# Patient Record
Sex: Female | Born: 1995 | Race: Black or African American | Hispanic: No | Marital: Single | Smoking: Never smoker
Health system: Southern US, Community
[De-identification: ages and names within clinical notes are randomized; demographics above are authoritative.]

## PROBLEM LIST (undated history)

## (undated) ENCOUNTER — Ambulatory Visit (HOSPITAL_COMMUNITY): Disposition: A | Discharge status: Other Acute Inpatient Hospital | Payer: Medicaid Other

## (undated) ENCOUNTER — Inpatient Hospital Stay (HOSPITAL_COMMUNITY): Payer: Self-pay

## (undated) DIAGNOSIS — O24419 Gestational diabetes mellitus in pregnancy, unspecified control: Secondary | ICD-10-CM

## (undated) DIAGNOSIS — E119 Type 2 diabetes mellitus without complications: Secondary | ICD-10-CM

## (undated) DIAGNOSIS — K219 Gastro-esophageal reflux disease without esophagitis: Secondary | ICD-10-CM

## (undated) DIAGNOSIS — N390 Urinary tract infection, site not specified: Secondary | ICD-10-CM

## (undated) HISTORY — PX: NO PAST SURGERIES: SHX2092

---
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
## 2014-06-15 ENCOUNTER — Encounter (HOSPITAL_COMMUNITY): Payer: Self-pay | Admitting: Emergency Medicine

## 2014-06-15 ENCOUNTER — Emergency Department (HOSPITAL_COMMUNITY)
Admission: EM | Admit: 2014-06-15 | Discharge: 2014-06-15 | Disposition: A | Discharge status: Home / Self Care | Payer: Medicaid Other | Attending: Emergency Medicine | Admitting: Emergency Medicine

## 2014-06-15 DIAGNOSIS — M79673 Pain in unspecified foot: Secondary | ICD-10-CM

## 2014-06-15 DIAGNOSIS — M79672 Pain in left foot: Secondary | ICD-10-CM | POA: Diagnosis not present

## 2014-06-15 DIAGNOSIS — M79671 Pain in right foot: Secondary | ICD-10-CM | POA: Insufficient documentation

## 2014-06-15 MED ORDER — NAPROXEN 500 MG PO TABS: 500.0000 mg | ORAL_TABLET | Freq: Two times a day (BID) | ORAL | Status: DC

## 2014-06-15 NOTE — ED Notes (Signed)
Pt reports that "when I wake up  In the morning my feet be stuck" and that pt has to soak her feet in hot water. 10/10. Reports this has been going on 1.5 months.

## 2014-06-15 NOTE — ED Provider Notes (Signed)
CSN: [id number]     Arrival date & time 06/15/14  [id number] History  This chart was scribed for a non-physician practitioner, Joycie PeekBenjamin Harrell Niehoff, PA-C working with Gilda Creasehristopher J. Pollina, MD by [place] Peace, ED Scribe. The patient was seen in [facility]. The patient's care was started at 7:55 PM.    Chief Complaint  Patient presents with  . Foot Pain      Patient is a 18 y.o. female presenting with lower extremity pain. The history is provided by the patient. No language interpreter was used.  Foot Pain   HPI Comments: Annette Juarez is a 18 y.o. female who presents to the Emergency Department complaining of bilateral foot pain onset 1.5 months. Pt states that when she wakes up in the morning, "her feet can't move, as if they are "frozen". She reports that pain is specifically to top part of both of her feet. She adds that she has to soak her feet in hot water to "unfreeze" them in the mornings which provide her with brief relief. Pt denies history of gout or any type of arthritis. She states that she just took medication for the first time yesterday in order to address the problem with no improvement. Pt is non-smoker.    History reviewed. No pertinent past medical history. History reviewed. No pertinent past surgical history. History reviewed. No pertinent family history. History  Substance Use Topics  . Smoking status: Never Smoker   . Smokeless tobacco: Not on file  . Alcohol Use: No   OB History   Grav Para Term Preterm Abortions TAB SAB Ect Mult Living                 Review of Systems  Constitutional: Negative for fever.  Musculoskeletal:       Bilateral foot pain.   All other systems reviewed and are negative.     Allergies  Review of patient's allergies indicates no known allergies.  Home Medications   Prior to Admission medications   Medication Sig Start Date End Date Taking? Authorizing Provider  naproxen (NAPROSYN) 500 MG tablet Take 1 tablet (500 mg total) by  mouth 2 (two) times daily. 06/15/14   Earle GellBenjamin W Damarie Schoolfield, PA-C   BP 154/86  Pulse 98  Temp(Src) 97.8 F (36.6 C) (Oral)  Resp 16  SpO2 100%  LMP 05/11/2014 Physical Exam  Nursing note and vitals reviewed. Constitutional: She is oriented to person, place, and time. She appears well-developed and well-nourished. No distress.  Awake, alert, nontoxic appearance.  HENT:  Head: Normocephalic and atraumatic.  Eyes: Conjunctivae and EOM are normal. Right eye exhibits no discharge. Left eye exhibits no discharge.  Neck: Neck supple. No tracheal deviation present.  Cardiovascular: Normal rate.   Pulmonary/Chest: Effort normal. No respiratory distress. She exhibits no tenderness.  Abdominal: Soft. There is no tenderness. There is no rebound.  Musculoskeletal: Normal range of motion. She exhibits no tenderness.  Baseline ROM, no obvious new focal weakness. No active tenderness to either foot. Cap refill <2 secs bil. Full active ROM of both feet. No erythema or edema. No obvious rashes, lesions or other deformities appreciated.  Neurological: She is alert and oriented to person, place, and time.  Mental status and motor strength appears baseline for patient and situation.  Skin: Skin is warm and dry. No rash noted.  Psychiatric: She has a normal mood and affect. Her behavior is normal.    ED Course  Procedures (including critical care time) Labs Review Labs Reviewed -  No data to display  Results for orders placed during the hospital encounter of 09/30/13  URINALYSIS, ROUTINE W REFLEX MICROSCOPIC      Result Value Ref Range   Color, Urine YELLOW  YELLOW   APPearance HAZY (*) CLEAR   Specific Gravity, Urine 1.029  1.005 - 1.030   pH 6.5  5.0 - 8.0   Glucose, UA NEGATIVE  NEGATIVE mg/dL   Hgb urine dipstick NEGATIVE  NEGATIVE   Bilirubin Urine NEGATIVE  NEGATIVE   Ketones, ur NEGATIVE  NEGATIVE mg/dL   Protein, ur NEGATIVE  NEGATIVE mg/dL   Urobilinogen, UA 1.0  0.0 - 1.0 mg/dL    Nitrite NEGATIVE  NEGATIVE   Leukocytes, UA NEGATIVE  NEGATIVE  POCT PREGNANCY, URINE      Result Value Ref Range   Preg Test, Ur NEGATIVE  NEGATIVE   No results found.    Imaging Review No results found.   EKG Interpretation None     Medications - No data to display  8:01 PM- Treatment plan was discussed with patient who verbalizes understanding and agrees.   MDM  Vitals stable - WNL -afebrile Pt resting comfortably in ED. PE not concerning for acute or emergent pathology. Symptoms likely due to inflammation. Pt displays no neuro deficits, distal pulses intact. Low suspicion for frx, dislocation or other anatomical abnormality. Pt able to ambulate independently with no ataxia. Will DC with Naproxen for pain and inflammation relief. Discussed f/u with PCP and return precautions, pt very amenable to plan.   Final diagnoses:  Foot pain, unspecified laterality      I personally performed the services described in this documentation, which was scribed in my presence. The recorded information has been reviewed and is accurate.   Earle GellBenjamin W Adamsburgartner, PA-C 06/16/14 1219

## 2014-06-18 NOTE — ED Provider Notes (Signed)
Medical screening examination/treatment/procedure(s) were performed by non-physician practitioner and as supervising physician I was immediately available for consultation/collaboration.   EKG Interpretation None        Mathew Storck J. Loria Lacina, MD 06/18/14 1447 

## 2014-08-09 ENCOUNTER — Emergency Department (HOSPITAL_COMMUNITY)
Admission: EM | Admit: 2014-08-09 | Discharge: 2014-08-09 | Disposition: A | Discharge status: Home / Self Care | Payer: Medicaid Other | Attending: Emergency Medicine | Admitting: Emergency Medicine

## 2014-08-09 ENCOUNTER — Encounter (HOSPITAL_COMMUNITY): Payer: Self-pay | Admitting: Emergency Medicine

## 2014-08-09 DIAGNOSIS — R197 Diarrhea, unspecified: Secondary | ICD-10-CM

## 2014-08-09 DIAGNOSIS — Z3202 Encounter for pregnancy test, result negative: Secondary | ICD-10-CM | POA: Diagnosis not present

## 2014-08-09 DIAGNOSIS — R110 Nausea: Secondary | ICD-10-CM | POA: Insufficient documentation

## 2014-08-09 DIAGNOSIS — R109 Unspecified abdominal pain: Secondary | ICD-10-CM | POA: Diagnosis present

## 2014-08-09 LAB — COMPREHENSIVE METABOLIC PANEL
ALBUMIN: 4.2 g/dL (ref 3.5–5.2)
ALK PHOS: 71 U/L (ref 39–117)
ALT: 14 U/L (ref 0–35)
ANION GAP: 14 (ref 5–15)
AST: 16 U/L (ref 0–37)
BILIRUBIN TOTAL: 0.4 mg/dL (ref 0.3–1.2)
BUN: 9 mg/dL (ref 6–23)
CHLORIDE: 101 meq/L (ref 96–112)
CO2: 22 mEq/L (ref 19–32)
Calcium: 9.2 mg/dL (ref 8.4–10.5)
Creatinine, Ser: 0.62 mg/dL (ref 0.50–1.10)
GFR calc Af Amer: >90 mL/min (ref >90)
GFR calc non Af Amer: >90 mL/min (ref >90)
Glucose, Bld: 82 mg/dL (ref 70–99)
POTASSIUM: 4.4 meq/L (ref 3.7–5.3)
Sodium: 137 mEq/L (ref 137–147)
Total Protein: 8.5 g/dL — ABNORMAL HIGH (ref 6.0–8.3)

## 2014-08-09 LAB — URINALYSIS, ROUTINE W REFLEX MICROSCOPIC
BILIRUBIN URINE: NEGATIVE
GLUCOSE, UA: NEGATIVE mg/dL
Hgb urine dipstick: NEGATIVE
Ketones, ur: NEGATIVE mg/dL
Leukocytes, UA: NEGATIVE
Nitrite: NEGATIVE
Protein, ur: NEGATIVE mg/dL
Specific Gravity, Urine: 1.023 (ref 1.005–1.030)
UROBILINOGEN UA: 1 mg/dL (ref 0.0–1.0)
pH: 8 (ref 5.0–8.0)

## 2014-08-09 LAB — PREGNANCY, URINE: Preg Test, Ur: NEGATIVE

## 2014-08-09 LAB — CBC WITH DIFFERENTIAL/PLATELET
BASOS ABS: 0 10*3/uL (ref 0.0–0.1)
Basophils Relative: 1 % (ref 0–1)
EOS ABS: 0.3 10*3/uL (ref 0.0–0.7)
Eosinophils Relative: 4 % (ref 0–5)
HEMATOCRIT: 41.3 % (ref 36.0–46.0)
Hemoglobin: 14.2 g/dL (ref 12.0–15.0)
Lymphocytes Relative: 51 % — ABNORMAL HIGH (ref 12–46)
Lymphs Abs: 4.6 10*3/uL — ABNORMAL HIGH (ref 0.7–4.0)
MCH: 30.9 pg (ref 26.0–34.0)
MCHC: 34.4 g/dL (ref 30.0–36.0)
MCV: 90 fL (ref 78.0–100.0)
Monocytes Absolute: 0.6 10*3/uL (ref 0.1–1.0)
Monocytes Relative: 7 % (ref 3–12)
Neutro Abs: 3.3 10*3/uL (ref 1.7–7.7)
Neutrophils Relative %: 37 % — ABNORMAL LOW (ref 43–77)
Platelets: 403 10*3/uL — ABNORMAL HIGH (ref 150–400)
RBC: 4.59 MIL/uL (ref 3.87–5.11)
RDW: 12.2 % (ref 11.5–15.5)
WBC: 8.8 10*3/uL (ref 4.0–10.5)

## 2014-08-09 LAB — LIPASE, BLOOD: Lipase: 32 U/L (ref 11–59)

## 2014-08-09 MED ORDER — NAPROXEN 500 MG PO TABS: 500.0000 mg | ORAL_TABLET | Freq: Two times a day (BID) | ORAL | Status: DC

## 2014-08-09 MED ORDER — PROMETHAZINE HCL 25 MG PO TABS: 25.0000 mg | ORAL_TABLET | Freq: Four times a day (QID) | ORAL | Status: DC | PRN

## 2014-08-09 MED ORDER — DICYCLOMINE HCL 20 MG PO TABS: 20.0000 mg | ORAL_TABLET | Freq: Two times a day (BID) | ORAL | Status: DC | PRN

## 2014-08-09 NOTE — Discharge Instructions (Signed)
Diarrhea °Diarrhea is watery poop (stool). It can make you feel weak, tired, thirsty, or give you a dry mouth (signs of dehydration). Watery poop is a sign of another problem, most often an infection. It often lasts 2-3 days. It can last longer if it is a sign of something serious. Take care of yourself as told by your doctor. °HOME CARE  °· Drink 1 cup (8 ounces) of fluid each time you have watery poop. °· Do not drink the following fluids: °¨ Those that contain simple sugars (fructose, glucose, galactose, lactose, sucrose, maltose). °¨ Sports drinks. °¨ Fruit juices. °¨ Whole milk products. °¨ Sodas. °¨ Drinks with caffeine (coffee, tea, soda) or alcohol. °· Oral rehydration solution may be used if the doctor says it is okay. You may make your own solution. Follow this recipe: °¨  - teaspoon table salt. °¨ ¾ teaspoon baking soda. °¨  teaspoon salt substitute containing potassium chloride. °¨ 1 tablespoons sugar. °¨ 1 liter (34 ounces) of water. °· Avoid the following foods: °¨ High fiber foods, such as raw fruits and vegetables. °¨ Nuts, seeds, and whole grain breads and cereals. °¨  Those that are sweetened with sugar alcohols (xylitol, sorbitol, mannitol). °· Try eating the following foods: °¨ Starchy foods, such as rice, toast, pasta, low-sugar cereal, oatmeal, baked potatoes, crackers, and bagels. °¨ Bananas. °¨ Applesauce. °· Eat probiotic-rich foods, such as yogurt and milk products that are fermented. °· Wash your hands well after each time you have watery poop. °· Only take medicine as told by your doctor. °· Take a warm bath to help lessen burning or pain from having watery poop. °GET HELP RIGHT AWAY IF:  °· You cannot drink fluids without throwing up (vomiting). °· You keep throwing up. °· You have blood in your poop, or your poop looks black and tarry. °· You do not pee (urinate) in 6-8 hours, or there is only a small amount of very dark pee. °· You have belly (abdominal) pain that gets worse or stays  in the same spot (localizes). °· You are weak, dizzy, confused, or light-headed. °· You have a very bad headache. °· Your watery poop gets worse or does not get better. °· You have a fever or lasting symptoms for more than 2-3 days. °· You have a fever and your symptoms suddenly get worse. °MAKE SURE YOU:  °· Understand these instructions. °· Will watch your condition. °· Will get help right away if you are not doing well or get worse. °Document Released: 02/01/2008 Document Revised: 12/30/2013 Document Reviewed: 04/22/2012 °[employer]® Patient Information ©2015 [employer]. This information is not intended to replace advice given to you by your health care provider. Make sure you discuss any questions you have with your health care provider. ° °

## 2014-08-09 NOTE — ED Notes (Signed)
Pt from home c/o low back pain and lower abdominal pain. She also reports BRB from the rectum and in her stools. Denies urinary symptoms. C/o  Nausea no vomiting, diarrhea present.

## 2014-08-09 NOTE — ED Provider Notes (Signed)
CSN: [id number]     Arrival date & time 08/09/14  1137 History   First MD Initiated Contact with Patient 08/09/14 1226     Chief Complaint  Patient presents with  . Back Pain  . Abdominal Pain   HPI Patient presents to the emergency room with complaints of back pain, diarrhea and abdominal cramping. Symptoms started a few days ago. She had some nausea but no vomiting. She's also had some intermittent diarrhea. She has noticed some blood in her stool when it first started but has not noticed any recently. She denies any fevers. She denies any vaginal discharge. She denies any dysuria. Appetite has otherwise been unaffected. Nothing seems to make the pain better or worse. She denies any ill contacts. No recent antibiotics. No foreign travel. History reviewed. No pertinent past medical history. History reviewed. No pertinent past surgical history. No family history on file. History  Substance Use Topics  . Smoking status: Never Smoker   . Smokeless tobacco: Not on file  . Alcohol Use: No   OB History    No data available     Review of Systems  All other systems reviewed and are negative.     Allergies  Review of patient's allergies indicates no known allergies.  Home Medications   Prior to Admission medications   Medication Sig Start Date End Date Taking? Authorizing Provider  dicyclomine (BENTYL) 20 MG tablet Take 1 tablet (20 mg total) by mouth 2 (two) times daily as needed for spasms. 08/09/14   Linwood DibblesJon Baelynn Schmuhl, MD  naproxen (NAPROSYN) 500 MG tablet Take 1 tablet (500 mg total) by mouth 2 (two) times daily. 08/09/14   Linwood DibblesJon Deshunda Thackston, MD  promethazine (PHENERGAN) 25 MG tablet Take 1 tablet (25 mg total) by mouth every 6 (six) hours as needed for nausea or vomiting. 08/09/14   Linwood DibblesJon Baker Moronta, MD   BP 125/77 mmHg  Pulse 71  Temp(Src) 98 F (36.7 C) (Oral)  Resp 18  SpO2 100%  LMP 07/17/2014 Physical Exam  Constitutional: She appears well-developed and well-nourished. No distress.  HENT:   Head: Normocephalic and atraumatic.  Right Ear: External ear normal.  Left Ear: External ear normal.  Eyes: Conjunctivae are normal. Right eye exhibits no discharge. Left eye exhibits no discharge. No scleral icterus.  Neck: Neck supple. No tracheal deviation present.  Cardiovascular: Normal rate, regular rhythm and intact distal pulses.   Pulmonary/Chest: Effort normal and breath sounds normal. No stridor. No respiratory distress. She has no wheezes. She has no rales.  Abdominal: Soft. Bowel sounds are normal. She exhibits no distension. There is no tenderness. There is no rebound and no guarding.  Musculoskeletal: She exhibits no edema or tenderness.  Neurological: She is alert. She has normal strength. No cranial nerve deficit (no facial droop, extraocular movements intact, no slurred speech) or sensory deficit. She exhibits normal muscle tone. She displays no seizure activity. Coordination normal.  Skin: Skin is warm and dry. No rash noted.  Psychiatric: She has a normal mood and affect.  Nursing note and vitals reviewed.   ED Course  Procedures (including critical care time) Labs Review Labs Reviewed  COMPREHENSIVE METABOLIC PANEL - Abnormal; Notable for the following:    Total Protein 8.5 (*)    All other components within normal limits  CBC WITH DIFFERENTIAL - Abnormal; Notable for the following:    Platelets 403 (*)    Neutrophils Relative % 37 (*)    Lymphocytes Relative 51 (*)    Lymphs Abs  4.6 (*)    All other components within normal limits  URINALYSIS, ROUTINE W REFLEX MICROSCOPIC - Abnormal; Notable for the following:    APPearance CLOUDY (*)    All other components within normal limits  LIPASE, BLOOD  PREGNANCY, URINE  POC URINE PREG, ED    Imaging Review No results found.   MDM   Final diagnoses:  Diarrhea    Doubt appendicitis or colitis.  No focal abdominal ttp.  Nl labs.  Doubt significant gi bleeding.  Labs normal.  No recent blood in her stool.   ?viral illness    At this time there does not appear to be any evidence of an acute emergency medical condition and the patient appears stable for discharge with appropriate outpatient follow up.    Linwood DibblesJon Mateja Dier, MD 08/09/14 [phone]

## 2014-12-27 ENCOUNTER — Emergency Department (HOSPITAL_COMMUNITY)
Admission: EM | Admit: 2014-12-27 | Discharge: 2014-12-27 | Disposition: A | Discharge status: Home / Self Care | Payer: Medicaid Other | Attending: Emergency Medicine | Admitting: Emergency Medicine

## 2014-12-27 ENCOUNTER — Encounter (HOSPITAL_COMMUNITY): Payer: Self-pay | Admitting: Emergency Medicine

## 2014-12-27 DIAGNOSIS — Z3202 Encounter for pregnancy test, result negative: Secondary | ICD-10-CM | POA: Diagnosis not present

## 2014-12-27 DIAGNOSIS — R1032 Left lower quadrant pain: Secondary | ICD-10-CM | POA: Insufficient documentation

## 2014-12-27 DIAGNOSIS — Z79899 Other long term (current) drug therapy: Secondary | ICD-10-CM | POA: Diagnosis not present

## 2014-12-27 DIAGNOSIS — N939 Abnormal uterine and vaginal bleeding, unspecified: Secondary | ICD-10-CM | POA: Diagnosis not present

## 2014-12-27 DIAGNOSIS — R1031 Right lower quadrant pain: Secondary | ICD-10-CM | POA: Diagnosis not present

## 2014-12-27 DIAGNOSIS — R1030 Lower abdominal pain, unspecified: Secondary | ICD-10-CM | POA: Diagnosis present

## 2014-12-27 LAB — URINALYSIS, ROUTINE W REFLEX MICROSCOPIC
BILIRUBIN URINE: NEGATIVE
GLUCOSE, UA: NEGATIVE mg/dL
KETONES UR: NEGATIVE mg/dL
LEUKOCYTES UA: NEGATIVE
Nitrite: NEGATIVE
PROTEIN: NEGATIVE mg/dL
Specific Gravity, Urine: 1.029 (ref 1.005–1.030)
Urobilinogen, UA: 1 mg/dL (ref 0.0–1.0)
pH: 5.5 (ref 5.0–8.0)

## 2014-12-27 LAB — URINE MICROSCOPIC-ADD ON

## 2014-12-27 LAB — WET PREP, GENITAL
TRICH WET PREP: NONE SEEN
WBC WET PREP: NONE SEEN
Yeast Wet Prep HPF POC: NONE SEEN

## 2014-12-27 LAB — POC URINE PREG, ED: PREG TEST UR: NEGATIVE

## 2014-12-27 MED ORDER — ONDANSETRON HCL 4 MG PO TABS: 4.0000 mg | ORAL_TABLET | Freq: Four times a day (QID) | ORAL | Status: DC

## 2014-12-27 MED ORDER — ONDANSETRON HCL 4 MG/2ML IJ SOLN
4.0000 mg | Freq: Once | INTRAMUSCULAR | Status: DC
  Filled 2014-12-27: qty 2

## 2014-12-27 MED ORDER — ONDANSETRON 8 MG PO TBDP
8.0000 mg | ORAL_TABLET | Freq: Once | ORAL | Status: AC
  Administered 2014-12-27: 8 mg via ORAL
  Filled 2014-12-27: qty 1

## 2014-12-27 NOTE — ED Notes (Signed)
Attempted to start IV, pt refused before stick

## 2014-12-27 NOTE — ED Notes (Signed)
Pt alert, oriented, and ambulatory upon. DC. She was advised to follow [facility] with [facility]. 

## 2014-12-27 NOTE — ED Provider Notes (Signed)
CSN: [id number]     Arrival date & time 12/27/14  1621 History   First MD Initiated Contact with Patient 12/27/14 1637     Chief Complaint  Patient presents with  . Abdominal Pain     (Consider location/radiation/quality/duration/timing/severity/associated sxs/prior Treatment) HPI Comments: Patient presents today with lower abdominal pain.  She reports that the pain is located across her lower abdomen and has been present intermittently over the past couple of months.  She describes the pain as a cramping pain, but reports that occasionally the pain becomes sharp.  She has been taking Ibuprofen for the pain, which helps.  She denies pain at this time.  She reports associated nausea and has had two episodes of vomiting earlier today.  She also reports associated diarrhea over the past 2 days.  She also reports whitish colored vaginal discharge over the past 2 weeks.  She denies fever, chills, urinary symptoms, or constipation.  LMP ws 12/22/14, which lasted for 5 days.  She states that prior to that she did not have a menstrual period for 5 months.  Patient is a 19 y.o. female presenting with abdominal pain. The history is provided by the patient.  Abdominal Pain   History reviewed. No pertinent past medical history. History reviewed. No pertinent past surgical history. No family history on file. History  Substance Use Topics  . Smoking status: Never Smoker   . Smokeless tobacco: Not on file  . Alcohol Use: No   OB History    No data available     Review of Systems  Gastrointestinal: Positive for abdominal pain.  All other systems reviewed and are negative.     Allergies  Review of patient's allergies indicates no known allergies.  Home Medications   Prior to Admission medications   Medication Sig Start Date End Date Taking? Authorizing Provider  ibuprofen (ADVIL,MOTRIN) 200 MG tablet Take 800 mg by mouth every 6 (six) hours as needed for moderate pain.   Yes Historical  Provider, MD  dicyclomine (BENTYL) 20 MG tablet Take 1 tablet (20 mg total) by mouth 2 (two) times daily as needed for spasms. Patient not taking: Reported on 12/27/2014 08/09/14   Linwood Dibbles, MD  naproxen (NAPROSYN) 500 MG tablet Take 1 tablet (500 mg total) by mouth 2 (two) times daily. Patient not taking: Reported on 12/27/2014 08/09/14   Linwood Dibbles, MD  promethazine (PHENERGAN) 25 MG tablet Take 1 tablet (25 mg total) by mouth every 6 (six) hours as needed for nausea or vomiting. Patient not taking: Reported on 12/27/2014 08/09/14   Linwood Dibbles, MD   BP 132/96 mmHg  Pulse 95  Temp(Src) 98 F (36.7 C) (Oral)  Resp 18  SpO2 100%  LMP 12/27/2014 Physical Exam  Constitutional: She appears well-developed and well-nourished.  HENT:  Head: Normocephalic and atraumatic.  Mouth/Throat: Oropharynx is clear and moist.  Neck: Normal range of motion. Neck supple.  Cardiovascular: Normal rate, regular rhythm and normal heart sounds.   Pulmonary/Chest: Effort normal and breath sounds normal.  Abdominal: Soft. Bowel sounds are normal. She exhibits no distension and no mass. There is no tenderness. There is no rebound and no guarding.  Obese abdomen  Genitourinary: Cervix exhibits no motion tenderness. Right adnexum displays no mass, no tenderness and no fullness. Left adnexum displays no mass, no tenderness and no fullness.  Small amount of blood in the vaginal vault.  Musculoskeletal: Normal range of motion.  Neurological: She is alert.  Skin: Skin is warm and dry.  Psychiatric: She has a normal mood and affect.  Nursing note and vitals reviewed.   ED Course  Procedures (including critical care time) Labs Review Labs Reviewed  WET PREP, GENITAL  CBC WITH DIFFERENTIAL/PLATELET  COMPREHENSIVE METABOLIC PANEL  URINALYSIS, ROUTINE W REFLEX MICROSCOPIC  HIV ANTIBODY (ROUTINE TESTING)  POC URINE PREG, ED  GC/CHLAMYDIA PROBE AMP ([facility])    Imaging Review No results found.   EKG  Interpretation None      MDM   Final diagnoses:  None   Patient presents today with intermittent bilateral lower abdominal pain that has been present intermittently over the past couple of months.  Urine pregnancy negative.  UA is also negative.  Patient refused labs.  Abdomen non tender to palpation at this time.  No CMT or adnexal tenderness on exam.  Therefore, do not feel that imaging is indicated at this time.  She also reports irregular menses.  [facility]  Patient stable for discharge.  Return precautions given.      Santiago GladHeather Juwon Scripter, PA-C 12/28/14 1059  Linwood DibblesJon Knapp, MD 12/28/14 [phone]

## 2014-12-27 NOTE — ED Notes (Signed)
Per pt, states she has been on her period for 5 days-has'nt had one in 5 months-has not had a gyn check up or seen PCP

## 2014-12-27 NOTE — ED Notes (Signed)
Pt states she is to scared to have blood drawn. She says she can not do it.

## 2014-12-29 LAB — GC/CHLAMYDIA PROBE AMP (~~LOC~~) NOT AT ARMC
Chlamydia: NEGATIVE
Neisseria Gonorrhea: NEGATIVE

## 2015-07-07 ENCOUNTER — Emergency Department (HOSPITAL_COMMUNITY): Payer: Medicaid Other

## 2015-07-07 ENCOUNTER — Emergency Department (HOSPITAL_COMMUNITY)
Admission: EM | Admit: 2015-07-07 | Discharge: 2015-07-07 | Disposition: A | Discharge status: Home / Self Care | Payer: Medicaid Other | Attending: Emergency Medicine | Admitting: Emergency Medicine

## 2015-07-07 ENCOUNTER — Encounter (HOSPITAL_COMMUNITY): Payer: Self-pay | Admitting: Emergency Medicine

## 2015-07-07 DIAGNOSIS — Y9389 Activity, other specified: Secondary | ICD-10-CM | POA: Insufficient documentation

## 2015-07-07 DIAGNOSIS — W01198A Fall on same level from slipping, tripping and stumbling with subsequent striking against other object, initial encounter: Secondary | ICD-10-CM | POA: Insufficient documentation

## 2015-07-07 DIAGNOSIS — S20221A Contusion of right back wall of thorax, initial encounter: Secondary | ICD-10-CM

## 2015-07-07 DIAGNOSIS — S300XXA Contusion of lower back and pelvis, initial encounter: Secondary | ICD-10-CM | POA: Diagnosis not present

## 2015-07-07 DIAGNOSIS — Y998 Other external cause status: Secondary | ICD-10-CM | POA: Insufficient documentation

## 2015-07-07 DIAGNOSIS — L03115 Cellulitis of right lower limb: Secondary | ICD-10-CM | POA: Diagnosis not present

## 2015-07-07 DIAGNOSIS — Z3202 Encounter for pregnancy test, result negative: Secondary | ICD-10-CM | POA: Diagnosis not present

## 2015-07-07 DIAGNOSIS — S3992XA Unspecified injury of lower back, initial encounter: Secondary | ICD-10-CM | POA: Diagnosis present

## 2015-07-07 DIAGNOSIS — Y9289 Other specified places as the place of occurrence of the external cause: Secondary | ICD-10-CM | POA: Diagnosis not present

## 2015-07-07 LAB — POC URINE PREG, ED: Preg Test, Ur: NEGATIVE

## 2015-07-07 MED ORDER — IBUPROFEN 800 MG PO TABS
800.0000 mg | ORAL_TABLET | Freq: Once | ORAL | Status: AC
  Administered 2015-07-07: 800 mg via ORAL
  Filled 2015-07-07: qty 1

## 2015-07-07 MED ORDER — KETOROLAC TROMETHAMINE 60 MG/2ML IM SOLN
30.0000 mg | Freq: Once | INTRAMUSCULAR | Status: DC
  Filled 2015-07-07: qty 2

## 2015-07-07 MED ORDER — METHOCARBAMOL 500 MG PO TABS: 500.0000 mg | ORAL_TABLET | Freq: Two times a day (BID) | ORAL | Status: DC

## 2015-07-07 MED ORDER — CEPHALEXIN 500 MG PO CAPS: 500.0000 mg | ORAL_CAPSULE | Freq: Three times a day (TID) | ORAL | Status: DC

## 2015-07-07 MED ORDER — IBUPROFEN 600 MG PO TABS: 600.0000 mg | ORAL_TABLET | Freq: Four times a day (QID) | ORAL | Status: DC | PRN

## 2015-07-07 NOTE — ED Notes (Signed)
Pt hit lower back on bed railing now c/o lower back pain.

## 2015-07-07 NOTE — ED Notes (Signed)
Patient was alert, oriented and stable upon discharge. RN went over AVS and patient had no further questions.  

## 2015-07-07 NOTE — ED Provider Notes (Signed)
CSN: [id number]     Arrival date & time 07/07/15  1518 History  By signing my name below, I, Murriel Hopper, attest that this documentation has been prepared under the direction and in the presence of [employer], PA-C.  Electronically Signed: Murriel Hopper, ED Scribe. 07/07/2015. 3:50 PM.       Chief Complaint  Patient presents with  . Back Pain     Patient is a 19 y.o. female presenting with back pain. The history is provided by the patient. No language interpreter was used.  Back Pain Location:  Lumbar spine Radiates to:  Does not radiate Pain severity:  Moderate Onset quality:  Gradual Duration:  3 days Timing:  Constant Progression:  Worsening Chronicity:  New Context: falling   Associated symptoms: no numbness and no weakness   Risk factors: obesity    HPI Comments: Annette Juarez is a 19 y.o. female who presents to the Emergency Department complaining of constant, worsening bilateral lower back pain with an associated laceration on the back of her left ankle that has been present for three days. Pt states that she fell three days ago while helping someone move their bed, and hit her lower back on the bed railing, and scraped the back of her left ankle on another part of the bed at the same time. Pt reports at the time the incident occurred she had no pain in her back, but over the course of the next two days, pain worsened. Pt states that she cleaned the laceration with water and peroxide and has put Neosporin on the area since then, but the wound has worsened in appearance and increased in tenderness to palpation. Pt states she took two Ibuprofen this morning with little relief.    History reviewed. No pertinent past medical history. History reviewed. No pertinent past surgical history. History reviewed. No pertinent family history. Social History  Substance Use Topics  . Smoking status: Never Smoker   . Smokeless tobacco: None  . Alcohol Use: No   OB History    No data available     Review of Systems  Musculoskeletal: Positive for myalgias, back pain and arthralgias.  Skin: Positive for wound.  Neurological: Negative for weakness and numbness.  All other systems reviewed and are negative.     Allergies  Review of patient's allergies indicates no known allergies.  Home Medications   Prior to Admission medications   Medication Sig Start Date End Date Taking? Authorizing Provider  ibuprofen (ADVIL,MOTRIN) 200 MG tablet Take 800 mg by mouth every 6 (six) hours as needed for moderate pain.   Yes Historical Provider, MD   BP 155/89 mmHg  Pulse 88  Temp(Src) 98 F (36.7 C) (Oral)  Resp 18  SpO2 100%  LMP 06/22/2015 (Approximate) Physical Exam  Constitutional: She is oriented to person, place, and time. She appears well-developed and well-nourished.  HENT:  Head: Normocephalic and atraumatic.  Eyes: Conjunctivae are normal.  Cardiovascular: Normal rate.   Pulmonary/Chest: Effort normal.  Abdominal: She exhibits no distension.  Musculoskeletal:  Midline lumbar spine tenderness and right perispinal tenderness. No pain with bilateral straight leg raise  Neurological: She is alert and oriented to person, place, and time.  5/5 and equal lower extremity strength. 2+ and equal patellar reflexes bilaterally. Pt able to dorsiflex bilateral toes and feet with good strength against resistance. Equal sensation bilaterally over thighs and lower legs.   Skin: Skin is warm and dry.  Psychiatric: She has a normal mood and  affect.  Nursing note and vitals reviewed.   ED Course  Procedures (including critical care time) DIAGNOSTIC STUDIES: Oxygen Saturation is 100% on room air, normal by my interpretation.    COORDINATION OF CARE: 3:48 PM Discussed treatment plan with pt at bedside and pt agreed to plan.   Labs Review Labs Reviewed - No data to display  Imaging Review No results found. I have personally reviewed and evaluated these images  and lab results as part of my medical decision-making.   EKG Interpretation None      MDM   Final diagnoses:  Contusion, back, right, initial encounter  Cellulitis of right ankle    patient with lower back pain after hitting it on a bed rail 3 days ago. No deformity or step-offs noted on exam. No neurological signs. Pain does not radiate. Doubt cauda equina. Pain is localized to the area where she hit it on the bed frame. X-ray obtained to rule out any spinous process fractures and it is negative. Home with ibuprofen, Robaxin, follow with primary care doctor.  Filed Vitals:   07/07/15 1523  BP: 155/89  Pulse: 88  Temp: 98 F (36.7 C)  TempSrc: Oral  Resp: 18  SpO2: 100%    I personally performed the services described in this documentation, which was scribed in my presence. The recorded information has been reviewed and is accurate.   Jaynie Crumbleatyana Mikiah Demond, PA-C 07/07/15 1705  Lorre NickAnthony Allen, MD 07/09/15 1328

## 2015-07-07 NOTE — Discharge Instructions (Signed)
Ibuprofen for pain. Robaxin for spasms. Try ice/heat. Rest. Follow up with primary care doctor if not improving.   Back Pain, Adult Back pain is very common in adults.The cause of back pain is rarely dangerous and the pain often gets better over time.The cause of your back pain may not be known. Some common causes of back pain include:  Strain of the muscles or ligaments supporting the spine.  Wear and tear (degeneration) of the spinal disks.  Arthritis.  Direct injury to the back. For many people, back pain may return. Since back pain is rarely dangerous, most people can learn to manage this condition on their own. HOME CARE INSTRUCTIONS Watch your back pain for any changes. The following actions may help to lessen any discomfort you are feeling:  Remain active. It is stressful on your back to sit or stand in one place for long periods of time. Do not sit, drive, or stand in one place for more than 30 minutes at a time. Take short walks on even surfaces as soon as you are able.Try to increase the length of time you walk each day.  Exercise regularly as directed by your health care provider. Exercise helps your back heal faster. It also helps avoid future injury by keeping your muscles strong and flexible.  Do not stay in bed.Resting more than 1-2 days can delay your recovery.  Pay attention to your body when you bend and lift. The most comfortable positions are those that put less stress on your recovering back. Always use proper lifting techniques, including:  Bending your knees.  Keeping the load close to your body.  Avoiding twisting.  Find a comfortable position to sleep. Use a firm mattress and lie on your side with your knees slightly bent. If you lie on your back, put a pillow under your knees.  Avoid feeling anxious or stressed.Stress increases muscle tension and can worsen back pain.It is important to recognize when you are anxious or stressed and learn ways to manage  it, such as with exercise.  Take medicines only as directed by your health care provider. Over-the-counter medicines to reduce pain and inflammation are often the most helpful.Your health care provider may prescribe muscle relaxant drugs.These medicines help dull your pain so you can more quickly return to your normal activities and healthy exercise.  Apply ice to the injured area:  Put ice in a plastic bag.  Place a towel between your skin and the bag.  Leave the ice on for 20 minutes, 2-3 times a day for the first 2-3 days. After that, ice and heat may be alternated to reduce pain and spasms.  Maintain a healthy weight. Excess weight puts extra stress on your back and makes it difficult to maintain good posture. SEEK MEDICAL CARE IF:  You have pain that is not relieved with rest or medicine.  You have increasing pain going down into the legs or buttocks.  You have pain that does not improve in one week.  You have night pain.  You lose weight.  You have a fever or chills. SEEK IMMEDIATE MEDICAL CARE IF:   You develop new bowel or bladder control problems.  You have unusual weakness or numbness in your arms or legs.  You develop nausea or vomiting.  You develop abdominal pain.  You feel faint.   This information is not intended to replace advice given to you by your health care provider. Make sure you discuss any questions you have with your  health care provider.   Document Released: 08/15/2005 Document Revised: 09/05/2014 Document Reviewed: 12/17/2013 Elsevier Interactive Patient Education 2016 [place] Injury Prevention Back injuries can be very painful. They can also be difficult to heal. After having one back injury, you are more likely to injure your back again. It is important to learn how to avoid injuring or re-injuring your back. The following tips can help you to prevent a back injury. WHAT SHOULD I KNOW ABOUT PHYSICAL FITNESS?  Exercise for 30  minutes per day on most days of the week or as told by your doctor. Make sure to:  Do aerobic exercises, such as walking, jogging, biking, or swimming.  Do exercises that increase balance and strength, such as tai chi and yoga.  Do stretching exercises. This helps with flexibility.  Try to develop strong belly (abdominal) muscles. Your belly muscles help to support your back.  Stay at a healthy weight. This helps to decrease your risk of a back injury. WHAT SHOULD I KNOW ABOUT MY DIET?  Talk with your doctor about your overall diet. Take supplements and vitamins only as told by your doctor.  Talk with your doctor about how much calcium and vitamin D you need each day. These nutrients help to prevent weakening of the bones (osteoporosis).  Include good sources of calcium in your diet, such as:  Dairy products.  Green leafy vegetables.  Products that have had calcium added to them (fortified).  Include good sources of vitamin D in your diet, such as:  Milk.  Foods that have had vitamin D added to them. WHAT SHOULD I KNOW ABOUT MY POSTURE?  Sit up straight and stand up straight. Avoid leaning forward when you sit or hunching over when you stand.  Choose chairs that have good low-back (lumbar) support.  If you work at a desk, sit close to it so you do not need to lean over. Keep your chin tucked in. Keep your neck drawn back. Keep your elbows bent so your arms look like the letter "L" (right angle).  Sit high and close to the steering wheel when you drive. Add a low-back support to your car seat, if needed.  Avoid sitting or standing in one position for very long. Take breaks to get up, stretch, and walk around at least one time every hour. Take breaks every hour if you are driving for long periods of time.  Sleep on your side with your knees slightly bent, or sleep on your back with a pillow under your knees. Do not lie on the front of your body to sleep. WHAT SHOULD I KNOW  ABOUT LIFTING, TWISTING, AND REACHING Lifting and Heavy Lifting  Avoid heavy lifting, especially lifting over and over again. If you must do heavy lifting:  Stretch before lifting.  Work slowly.  Rest between lifts.  Use a tool such as a cart or a dolly to move objects if one is available.  Make several small trips instead of carrying one heavy load.  Ask for help when you need it, especially when moving big objects.  Follow these steps when lifting:  Stand with your feet shoulder-width apart.  Get as close to the object as you can. Do not pick up a heavy object that is far from your body.  Use handles or lifting straps if they are available.  Bend at your knees. Squat down, but keep your heels off the floor.  Keep your shoulders back. Keep your chin tucked in.  Keep your back straight.  Lift the object slowly while you tighten the muscles in your legs, belly, and butt. Keep the object as close to the center of your body as possible.  Follow these steps when putting down a heavy load:  Stand with your feet shoulder-width apart.  Lower the object slowly while you tighten the muscles in your legs, belly, and butt. Keep the object as close to the center of your body as possible.  Keep your shoulders back. Keep your chin tucked in. Keep your back straight.  Bend at your knees. Squat down, but keep your heels off the floor.  Use handles or lifting straps if they are available. Twisting and Reaching  Avoid lifting heavy objects above your waist.  Do not twist at your waist while you are lifting or carrying a load. If you need to turn, move your feet.  Do not bend over without bending at your knees.  Avoid reaching over your head, across a table, or for an object on a high surface.  WHAT ARE SOME OTHER TIPS?  Avoid wet floors and icy ground. Keep sidewalks clear of ice to prevent falls.   Do not sleep on a mattress that is too soft or too hard.   Keep items that  you use often within easy reach.   Put heavier objects on shelves at waist level, and put lighter objects on lower or higher shelves.  Find ways to lower your stress, such as:  Exercise.  Massage.  Relaxation techniques.  Talk with your doctor if you feel anxious or depressed. These conditions can make back pain worse.  Wear flat heel shoes with cushioned soles.  Avoid making quick (sudden) movements.  Use both shoulder straps when carrying a backpack.  Do not use any tobacco products, including cigarettes, chewing tobacco, or electronic cigarettes. If you need help quitting, ask your doctor.   This information is not intended to replace advice given to you by your health care provider. Make sure you discuss any questions you have with your health care provider.   Document Released: 02/01/2008 Document Revised: 12/30/2014 Document Reviewed: 08/19/2014 Elsevier Interactive Patient Education [employer].

## 2015-12-06 ENCOUNTER — Encounter (HOSPITAL_COMMUNITY): Payer: Self-pay | Admitting: Emergency Medicine

## 2015-12-06 ENCOUNTER — Emergency Department (HOSPITAL_COMMUNITY)
Admission: EM | Admit: 2015-12-06 | Discharge: 2015-12-06 | Disposition: A | Discharge status: Home / Self Care | Payer: Medicaid Other | Attending: Emergency Medicine | Admitting: Emergency Medicine

## 2015-12-06 DIAGNOSIS — Z87828 Personal history of other (healed) physical injury and trauma: Secondary | ICD-10-CM | POA: Insufficient documentation

## 2015-12-06 DIAGNOSIS — Z792 Long term (current) use of antibiotics: Secondary | ICD-10-CM | POA: Diagnosis not present

## 2015-12-06 DIAGNOSIS — M545 Low back pain, unspecified: Secondary | ICD-10-CM

## 2015-12-06 DIAGNOSIS — M546 Pain in thoracic spine: Secondary | ICD-10-CM | POA: Diagnosis present

## 2015-12-06 DIAGNOSIS — E669 Obesity, unspecified: Secondary | ICD-10-CM | POA: Insufficient documentation

## 2015-12-06 MED ORDER — METHOCARBAMOL 500 MG PO TABS: 500.0000 mg | ORAL_TABLET | Freq: Two times a day (BID) | ORAL | Status: DC

## 2015-12-06 MED ORDER — NAPROXEN 500 MG PO TABS: 500.0000 mg | ORAL_TABLET | Freq: Two times a day (BID) | ORAL | Status: DC

## 2015-12-06 NOTE — ED Notes (Signed)
Pt sts mid back pain after tripping over a dresser 1 month ago

## 2015-12-06 NOTE — ED Provider Notes (Signed)
History  By signing my name below, I, Karle Plumber, attest that this documentation has been prepared under the direction and in the presence of [employer], PA-C. Electronically Signed: Karle Plumber, ED Scribe. 12/06/2015. 4:49 PM.  Chief Complaint  Patient presents with  . Back Pain   The history is provided by the patient and medical records. No language interpreter was used.    HPI Comments:  Annette Juarez is a 20 y.o. obese female who presents to the Emergency Department complaining of mid back pain that began about five months ago. She states she was carrying a dresser backwards and tripped causing her to fall against another smaller dresser. She states she was seen the day of the incident (07/07/15) and received negative X-Rays. She reports the pain radiates down into her left hip and she is experiencing left hip tinging. She has been taking Ibuprofen with no significant relief of the pain. Movement increases the pain. She denies alleviating factors. She denies bowel or bladder incontinence, fever, chills, nausea, vomiting. Pt has not followed up with orthopedist stating she just got her Medicaid. She does not currently have a PCP.   History reviewed. No pertinent past medical history. History reviewed. No pertinent past surgical history. History reviewed. No pertinent family history. Social History  Substance Use Topics  . Smoking status: Never Smoker   . Smokeless tobacco: None  . Alcohol Use: No   OB History    No data available     Review of Systems A complete 10 system review of systems was obtained and all systems are negative except as noted in the HPI and PMH.   Allergies  Review of patient's allergies indicates no known allergies.  Home Medications   Prior to Admission medications   Medication Sig Start Date End Date Taking? Authorizing Provider  cephALEXin (KEFLEX) 500 MG capsule Take 1 capsule (500 mg total) by mouth 3 (three) times daily. 07/07/15    Tatyana Kirichenko, PA-C  ibuprofen (ADVIL,MOTRIN) 600 MG tablet Take 1 tablet (600 mg total) by mouth every 6 (six) hours as needed. 07/07/15   Tatyana Kirichenko, PA-C  methocarbamol (ROBAXIN) 500 MG tablet Take 1 tablet (500 mg total) by mouth 2 (two) times daily. 07/07/15   Tatyana Kirichenko, PA-C   Triage Vitals: BP 165/85 mmHg  Pulse 96  Temp(Src) 98.3 F (36.8 C) (Oral)  Resp 18  SpO2 100% Physical Exam  Constitutional: She is oriented to person, place, and time. She appears well-developed and well-nourished.  HENT:  Head: Normocephalic and atraumatic.  Eyes: EOM are normal.  Neck: Normal range of motion.  Cardiovascular: Normal rate and regular rhythm.   Pulmonary/Chest: Effort normal and breath sounds normal.  Musculoskeletal: Normal range of motion. She exhibits tenderness.  Diffused tenderness to palpation to lumbar spine. No step off or deformities. No tenderness to thoracic spine.  Neurological: She is alert and oriented to person, place, and time.  Reflex Scores:      Patellar reflexes are 2+ on the right side and 2+ on the left side. Distal sensation of bilateral feet intact. Strength of BLE 5/5. DP pulses 2+.  Skin: Skin is warm and dry.  Psychiatric: She has a normal mood and affect. Her behavior is normal.  Nursing note and vitals reviewed.   ED Course  Procedures (including critical care time) DIAGNOSTIC STUDIES: Oxygen Saturation is 100% on RA, normal by my interpretation.   COORDINATION OF CARE: 4:48 PM- Will give referral to orthopedist and give resources for  pt to establish care with PCP. Will prescribe muscle relaxer. Pt verbalizes understanding and agrees to plan.  Medications - No data to display   MDM   Final diagnoses:  Midline low back pain without sciatica  Patient with back pain x 5 months.  No acute injury or trauma.  No neurological deficits and normal neuro exam.  Patient can walk but states is painful.  No loss of bowel or bladder control.   No concern for cauda equina.  No fever, night sweats, weight loss, h/o cancer, IVDU.  Patient stable for discharge.  Patient given referral to PCP and Orthopedics.  Return precautions given.     I personally performed the services described in this documentation, which was scribed in my presence. The recorded information has been reviewed and is accurate.    Santiago GladHeather Amry Cathy, PA-C 12/06/15 1952  Marily MemosJason Mesner, MD 12/06/15 [phone]

## 2016-04-02 ENCOUNTER — Emergency Department (HOSPITAL_COMMUNITY): Payer: Medicaid Other

## 2016-04-02 ENCOUNTER — Emergency Department (HOSPITAL_COMMUNITY)
Admission: EM | Admit: 2016-04-02 | Discharge: 2016-04-02 | Disposition: A | Discharge status: Home / Self Care | Payer: Medicaid Other | Attending: Emergency Medicine | Admitting: Emergency Medicine

## 2016-04-02 ENCOUNTER — Encounter (HOSPITAL_COMMUNITY): Payer: Self-pay | Admitting: Emergency Medicine

## 2016-04-02 DIAGNOSIS — Z3A01 Less than 8 weeks gestation of pregnancy: Secondary | ICD-10-CM | POA: Insufficient documentation

## 2016-04-02 DIAGNOSIS — R110 Nausea: Secondary | ICD-10-CM

## 2016-04-02 DIAGNOSIS — R102 Pelvic and perineal pain: Secondary | ICD-10-CM | POA: Diagnosis not present

## 2016-04-02 DIAGNOSIS — R197 Diarrhea, unspecified: Secondary | ICD-10-CM | POA: Diagnosis not present

## 2016-04-02 DIAGNOSIS — O210 Mild hyperemesis gravidarum: Secondary | ICD-10-CM | POA: Diagnosis present

## 2016-04-02 DIAGNOSIS — R109 Unspecified abdominal pain: Secondary | ICD-10-CM

## 2016-04-02 DIAGNOSIS — Z3490 Encounter for supervision of normal pregnancy, unspecified, unspecified trimester: Secondary | ICD-10-CM

## 2016-04-02 LAB — COMPREHENSIVE METABOLIC PANEL
ALBUMIN: 4.3 g/dL (ref 3.5–5.0)
ALT: 19 U/L (ref 14–54)
ANION GAP: 10 (ref 5–15)
AST: 22 U/L (ref 15–41)
Alkaline Phosphatase: 57 U/L (ref 38–126)
BILIRUBIN TOTAL: 0.9 mg/dL (ref 0.3–1.2)
BUN: <5 mg/dL — ABNORMAL LOW (ref 6–20)
CHLORIDE: 102 mmol/L (ref 101–111)
CO2: 22 mmol/L (ref 22–32)
Calcium: 9.5 mg/dL (ref 8.9–10.3)
Creatinine, Ser: 0.59 mg/dL (ref 0.44–1.00)
GFR calc Af Amer: >60 mL/min (ref >60)
GFR calc non Af Amer: >60 mL/min (ref >60)
GLUCOSE: 90 mg/dL (ref 65–99)
POTASSIUM: 3.6 mmol/L (ref 3.5–5.1)
Sodium: 134 mmol/L — ABNORMAL LOW (ref 135–145)
TOTAL PROTEIN: 7.8 g/dL (ref 6.5–8.1)

## 2016-04-02 LAB — URINALYSIS, ROUTINE W REFLEX MICROSCOPIC
Bilirubin Urine: NEGATIVE
Glucose, UA: NEGATIVE mg/dL
Hgb urine dipstick: NEGATIVE
LEUKOCYTES UA: NEGATIVE
NITRITE: NEGATIVE
PROTEIN: NEGATIVE mg/dL
Specific Gravity, Urine: 1.026 (ref 1.005–1.030)
pH: 6.5 (ref 5.0–8.0)

## 2016-04-02 LAB — CBC
HEMATOCRIT: 40.5 % (ref 36.0–46.0)
Hemoglobin: 13.9 g/dL (ref 12.0–15.0)
MCH: 31.1 pg (ref 26.0–34.0)
MCHC: 34.3 g/dL (ref 30.0–36.0)
MCV: 90.6 fL (ref 78.0–100.0)
PLATELETS: 429 10*3/uL — AB (ref 150–400)
RBC: 4.47 MIL/uL (ref 3.87–5.11)
RDW: 12.3 % (ref 11.5–15.5)
WBC: 11.1 10*3/uL — AB (ref 4.0–10.5)

## 2016-04-02 LAB — LIPASE, BLOOD: LIPASE: 19 U/L (ref 11–51)

## 2016-04-02 LAB — HCG, QUANTITATIVE, PREGNANCY: HCG, BETA CHAIN, QUANT, S: 28991 m[IU]/mL — AB (ref <5)

## 2016-04-02 LAB — POC URINE PREG, ED: PREG TEST UR: POSITIVE — AB

## 2016-04-02 MED ORDER — ONDANSETRON HCL 4 MG/2ML IJ SOLN: 4.0000 mg | Freq: Once | INTRAMUSCULAR | Status: DC

## 2016-04-02 MED ORDER — SODIUM CHLORIDE 0.9 % IV BOLUS (SEPSIS): 1000.0000 mL | Freq: Once | INTRAVENOUS | Status: DC

## 2016-04-02 NOTE — ED Notes (Signed)
Pt refused iv and medication, er md notified

## 2016-04-02 NOTE — ED Notes (Signed)
Patient dressed and ready to go when told her the PA wanted to do another test. She verbalized being ready to go. Went to tell PA and on return to room to set up pelvic exam, room is vacated and patient/friend are not in room, bathroom or hallway. Notified PA.

## 2016-04-02 NOTE — ED Triage Notes (Signed)
abd pain with nausea and diarrhea started 4 days ago, c/o "pain down in my ovaries that shoots to my private area"  States has been throwing up and having diarrhea also.

## 2016-04-02 NOTE — ED Provider Notes (Signed)
[facility]-EMERGENCY DEPT Provider Note   CSN: [id number] Arrival date & time: 04/02/16  1355  First Provider Contact:  First MD Initiated Contact with Patient 04/02/16 1830        History   Chief Complaint Chief Complaint  Patient presents with  . Abdominal Pain  . Emesis  . Diarrhea    HPI DEA BITTING is a 20 y.o. female.  Annette Juarez is a 20 y.o. female presents to ED with complaint of abdominal pain, nausea, and diarrhea. Patient states symptoms started approximately 3 days. Patient states abdominal pain is located in her lower abdomen, intermittent, and sharp/cramping in nature. She endorses nausea; however, unable to vomit. She endorses multiple episodes of diarrhea. She endorses bright red blood in stool. She denies that toilet bowl water turns red and denies blood on toilet tissue. She endorses she had an episode of vaginal "spotting" three days ago for one day. No further vaginal bleeding. She denies vaginal discharge or vaginal pain. She does endorse eating restaurant food prior to sx onset. She denies travel outside the [place]. No one with similar symptoms. She is sexually active with one female partner, she is not using any barrier protection. LMP 02/22/16. Denies gynecologist conditions or procedures. No h/o abdominal conditions.       History reviewed. No pertinent past medical history.  There are no active problems to display for this patient.   History reviewed. No pertinent surgical history.  OB History    No data available       Home Medications    Prior to Admission medications   Medication Sig Start Date End Date Taking? Authorizing Provider  cephALEXin (KEFLEX) 500 MG capsule Take 1 capsule (500 mg total) by mouth 3 (three) times daily. 07/07/15   Tatyana Kirichenko, PA-C  ibuprofen (ADVIL,MOTRIN) 600 MG tablet Take 1 tablet (600 mg total) by mouth every 6 (six) hours as needed. 07/07/15   Tatyana Kirichenko, PA-C  methocarbamol (ROBAXIN) 500 MG tablet Take  1 tablet (500 mg total) by mouth 2 (two) times daily. 12/06/15   Heather Laisure, PA-C  naproxen (NAPROSYN) 500 MG tablet Take 1 tablet (500 mg total) by mouth 2 (two) times daily. 12/06/15   Santiago Glad, PA-C    Family History No family history on file.  Social History Social History  Substance Use Topics  . Smoking status: Never Smoker  . Smokeless tobacco: Never Used  . Alcohol use No     Allergies   Review of patient's allergies indicates no known allergies.   Review of Systems Review of Systems  Constitutional: Positive for chills and diaphoresis.  Gastrointestinal: Positive for abdominal pain, diarrhea and nausea.  Genitourinary: Positive for pelvic pain.  Neurological: Positive for light-headedness.  All other systems reviewed and are negative.    Physical Exam Updated Vital Signs BP 101/55   Pulse 81   Temp 98.8 F (37.1 C) (Oral)   Resp 16   Ht 5\' 3"  (1.6 m)   Wt 98.5 kg   LMP 04/02/2016 Comment: states period is abnormal  SpO2 99%   BMI 38.45 kg/m   Physical Exam  Constitutional: She appears well-developed and well-nourished. No distress.  HENT:  Head: Normocephalic and atraumatic.  Mouth/Throat: Oropharynx is clear and moist. No oropharyngeal exudate.  Eyes: Conjunctivae and EOM are normal. Pupils are equal, round, and reactive to light. Right eye exhibits no discharge. Left eye exhibits no discharge. No scleral icterus.  Neck: Normal range of motion. Neck supple.  Cardiovascular:  Normal rate, regular rhythm, normal heart sounds and intact distal pulses.   No murmur heard. Pulmonary/Chest: Effort normal and breath sounds normal. No respiratory distress.  Abdominal: Soft. Bowel sounds are normal. There is generalized tenderness. There is no rebound, no guarding and no CVA tenderness.  Musculoskeletal: Normal range of motion.  Lymphadenopathy:    She has no cervical adenopathy.  Neurological: She is alert. Coordination normal.  Skin: Skin is warm  and dry. She is not diaphoretic.  Psychiatric: She has a normal mood and affect. Her behavior is normal.     ED Treatments / Results  Labs (all labs ordered are listed, but only abnormal results are displayed) Labs Reviewed  COMPREHENSIVE METABOLIC PANEL - Abnormal; Notable for the following:       Result Value   Sodium 134 (*)    BUN <5 (*)    All other components within normal limits  CBC - Abnormal; Notable for the following:    WBC 11.1 (*)    Platelets 429 (*)    All other components within normal limits  URINALYSIS, ROUTINE W REFLEX MICROSCOPIC (NOT AT [facility]) - Abnormal; Notable for the following:    APPearance CLOUDY (*)    Ketones, ur >80 (*)    All other components within normal limits  HCG, QUANTITATIVE, PREGNANCY - Abnormal; Notable for the following:    hCG, Beta Chain, Quant, S 28,991 (*)    All other components within normal limits  POC URINE PREG, ED - Abnormal; Notable for the following:    Preg Test, Ur POSITIVE (*)    All other components within normal limits  WET PREP, GENITAL  LIPASE, BLOOD  POC OCCULT BLOOD, ED  GC/CHLAMYDIA PROBE AMP ([place]) NOT AT [facility]    EKG  EKG Interpretation None       Radiology US Ob Limited  Result Date: 04/02/2016 CLINICAL DATA:  20 year old pregnant female with history of nausea, vomiting and diarrhea for the past 4 days. EXAM: OBSTETRIC <14 WK [place] AND TRANSVAGINAL OB US TECHNIQUE: Both transabdominal and transvaginal ultrasound examinations were performed for complete evaluation of the gestation as well as the maternal uterus, adnexal regions, and pelvic cul-de-sac. Transvaginal technique was performed to assess early pregnancy. COMPARISON:  None. FINDINGS: Intrauterine gestational sac: Single ovoid shaped Yolk sac:  Present. Embryo:  None. Cardiac Activity: None. Heart Rate: N/A MSD: 18.5  mm   6 w   6 d Subchorionic hemorrhage:  None visualized. Maternal uterus/adnexae: Bilateral ovaries are normal in appearance. No  significant free fluid in the cul-de-sac. IMPRESSION: 1. Findings are suspicious but not yet definitive for failed pregnancy. Recommend follow-up US in 10-14 days for definitive diagnosis. This recommendation follows SRU consensus guidelines: Diagnostic Criteria for Nonviable Pregnancy Early in the First Trimester. Malva Limes Med 2013; 956:2130-86. Electronically Signed   By: Trudie Reed M.D.   On: 04/02/2016 20:33   US Ob Transvaginal  Result Date: 04/02/2016 CLINICAL DATA:  20 year old pregnant female with history of nausea, vomiting and diarrhea for the past 4 days. EXAM: OBSTETRIC <14 WK [place] AND TRANSVAGINAL OB US TECHNIQUE: Both transabdominal and transvaginal ultrasound examinations were performed for complete evaluation of the gestation as well as the maternal uterus, adnexal regions, and pelvic cul-de-sac. Transvaginal technique was performed to assess early pregnancy. COMPARISON:  None. FINDINGS: Intrauterine gestational sac: Single ovoid shaped Yolk sac:  Present. Embryo:  None. Cardiac Activity: None. Heart Rate: N/A MSD: 18.5  mm   6 w   6  d Subchorionic hemorrhage:  None visualized. Maternal uterus/adnexae: Bilateral ovaries are normal in appearance. No significant free fluid in the cul-de-sac. IMPRESSION: 1. Findings are suspicious but not yet definitive for failed pregnancy. Recommend follow-up US in 10-14 days for definitive diagnosis. This recommendation follows SRU consensus guidelines: Diagnostic Criteria for Nonviable Pregnancy Early in the First Trimester. Malva Limes Med 2013; 762:2633-35. Electronically Signed   By: Trudie Reed M.D.   On: 04/02/2016 20:33    Procedures Procedures (including critical care time)  Medications Ordered in ED Medications  ondansetron [facility]) injection 4 mg (0 mg Intravenous Hold 04/02/16 1928)  sodium chloride 0.9 % bolus 1,000 mL (0 mLs Intravenous Hold 04/02/16 1929)     Initial Impression / Assessment and Plan / ED Course  I have reviewed the  triage vital signs and the nursing notes.  Pertinent labs & imaging results that were available during my care of the patient were reviewed by me and considered in my medical decision making (see chart for details).  Vitals:   04/02/16 1800 04/02/16 1815 04/02/16 1830 04/02/16 2039  BP: 117/68 (!) 108/54 101/55 137/84  Pulse: 96 98 81 75  Resp:    15  Temp:    98.7 F (37.1 C)  TempSrc:    Oral  SpO2: 100% 100% 99%   Weight:      Height:        Clinical Course  Comment By Time  Patient is up and walking around ED, dressed. She is in NAD. Able to tolerate PO fluids.  Lona Kettle, [place] 08/05 2050    Patient is afebrile and non-toxic appearing in NAD. Vital signs are stable. Physical exam remarkable for diffuse abdominal tenderness. Abdomen is soft with positive bowel sounds. No peritoneal signs. No guarding. No rigidity. Will check labs. Patient declined IVF and anti-emetic.  Labs remarkable for positive pregnancy test. CBC shows mild elevation of WBC, this may be secondary to multiple episodes of diarrhea. U/A remarkable for ketones, suggestive of dehydration given diarrhea x 3 days. CMP unremarkable. Will check B-hcg and obtain US to confirm IOP. Will do pelvic exam and hemoccult.    8:50PM: Patient in hallway and stopped me as I was walking by, dressed stating she needed to leave because she had work tomorrow morning. Discussed with patient needed to review US findings, complete pelvic, and hemoccult. Patient has tolerated fluids in ED without episodes of vomiting.   9:10PM: B-hcg ~28K. OB US shows [redacted]w[redacted]d IOP; however, no embryo or cardiac activity noted. Low suspicion for ectopic. When I went to discuss the results with patient and to complete pelvic exam and hemoccult patient was not in room. Belongings are gone. Suspect elopement.    Final Clinical Impressions(s) / ED Diagnoses   Final diagnoses:  Pregnant  Abdominal pain, unspecified abdominal location  Nausea    Diarrhea, unspecified type    New Prescriptions Discharge Medication List as of 04/02/2016  9:20 PM       Lona Kettle, PA-C 04/02/16 2128    Mancel Bale, MD 04/02/16 2208

## 2016-04-02 NOTE — ED Notes (Signed)
Patient has not returned to room and could not be found in waiting room.

## 2016-04-02 NOTE — ED Notes (Signed)
Patient returned from Ultrasound. 

## 2016-04-03 ENCOUNTER — Telehealth (HOSPITAL_BASED_OUTPATIENT_CLINIC_OR_DEPARTMENT_OTHER): Payer: Self-pay

## 2016-05-11 ENCOUNTER — Encounter: Payer: Self-pay | Admitting: Obstetrics and Gynecology

## 2016-05-11 ENCOUNTER — Ambulatory Visit (INDEPENDENT_AMBULATORY_CARE_PROVIDER_SITE_OTHER): Payer: Medicaid Other | Admitting: Obstetrics and Gynecology

## 2016-05-11 VITALS — BP 131/82 | HR 102 | Temp 98.4°F | Wt 220.6 lb

## 2016-05-11 DIAGNOSIS — O99212 Obesity complicating pregnancy, second trimester: Secondary | ICD-10-CM | POA: Diagnosis not present

## 2016-05-11 DIAGNOSIS — O99210 Obesity complicating pregnancy, unspecified trimester: Secondary | ICD-10-CM | POA: Insufficient documentation

## 2016-05-11 DIAGNOSIS — Z3402 Encounter for supervision of normal first pregnancy, second trimester: Secondary | ICD-10-CM | POA: Insufficient documentation

## 2016-05-11 DIAGNOSIS — O0990 Supervision of high risk pregnancy, unspecified, unspecified trimester: Secondary | ICD-10-CM | POA: Insufficient documentation

## 2016-05-11 MED ORDER — PROMETHAZINE HCL 25 MG PO TABS: 25.0000 mg | ORAL_TABLET | Freq: Four times a day (QID) | ORAL | 1 refills | Status: DC | PRN

## 2016-05-11 MED ORDER — PRENATAL VITAMINS 0.8 MG PO TABS: 1.0000 | ORAL_TABLET | Freq: Every day | ORAL | 12 refills | Status: DC

## 2016-05-11 NOTE — Progress Notes (Signed)
  Subjective:    Annette Juarez is a G1P0 4065w3d being seen today for her first obstetrical visit.  Her obstetrical history is significant for obesity. Patient does intend to breast feed. Pregnancy history fully reviewed.  Patient reports nausea and vomiting.  Vitals:   05/11/16 1056  BP: 131/82  Pulse: (!) 102  Temp: 98.4 F (36.9 C)  Weight: 220 lb 9.6 oz (100.1 kg)    HISTORY: OB History  Gravida Para Term Preterm AB Living  1            SAB TAB Ectopic Multiple Live Births               # Outcome Date GA Lbr Len/2nd Weight Sex Delivery Anes PTL Lv  1 Current              History reviewed. No pertinent past medical history. History reviewed. No pertinent surgical history. Family History  Problem Relation Age of Onset  . Hypertension Mother   . Diabetes Father   . Asthma Sister   . Diabetes Maternal Grandmother   . Hypertension Maternal Grandmother   . Hypertension Maternal Grandfather      Exam    Uterus:     Pelvic Exam:    Perineum: Normal Perineum   Vulva: normal   Vagina:  normal mucosa, normal discharge   pH:    Cervix: nulliparous appearance and closed and long   Adnexa: no mass, fullness, tenderness   Bony Pelvis: gynecoid  System: Breast:  normal appearance, no masses or tenderness   Skin: normal coloration and turgor, no rashes    Neurologic: oriented, no focal deficits   Extremities: normal strength, tone, and muscle mass   HEENT extra ocular movement intact   Mouth/Teeth mucous membranes moist, pharynx normal without lesions and dental hygiene good   Neck supple and no masses   Cardiovascular: regular rate and rhythm   Respiratory:  chest clear, no wheezing, crepitations, rhonchi, normal symmetric air entry   Abdomen: soft, non-tender; bowel sounds normal; no masses,  no organomegaly   Urinary:       Assessment:    Pregnancy: G1P0 Patient Active Problem List   Diagnosis Date Noted  . Supervision of normal first pregnancy, antepartum  05/11/2016  . Obesity affecting pregnancy, antepartum 05/11/2016        Plan:     Initial labs drawn. Prenatal vitamins. Problem list reviewed and updated. Genetic Screening discussed Quad Screen: requested.  Ultrasound discussed; fetal survey: requested. Rx for phenergan provided Patient to return fasting for early 2 hour glucola. Patient is fearful of blood draw and would prefer to have it done at the time of quad screen  Follow up in 4 weeks. 50% of 30 min visit spent on counseling and coordination of care.     Merritt Kibby 05/11/2016

## 2016-05-11 NOTE — Patient Instructions (Addendum)
Contraception Choices Contraception (birth control) is the use of any methods or devices to prevent pregnancy. Below are some methods to help avoid pregnancy. HORMONAL METHODS   Contraceptive implant. This is a thin, plastic tube containing progesterone hormone. It does not contain estrogen hormone. Your health care provider inserts the tube in the inner part of the upper arm. The tube can remain in place for up to 3 years. After 3 years, the implant must be removed. The implant prevents the ovaries from releasing an egg (ovulation), thickens the cervical mucus to prevent sperm from entering the uterus, and thins the lining of the inside of the uterus.  Progesterone-only injections. These injections are given every 3 months by your health care provider to prevent pregnancy. This synthetic progesterone hormone stops the ovaries from releasing eggs. It also thickens cervical mucus and changes the uterine lining. This makes it harder for sperm to survive in the uterus.  Birth control pills. These pills contain estrogen and progesterone hormone. They work by preventing the ovaries from releasing eggs (ovulation). They also cause the cervical mucus to thicken, preventing the sperm from entering the uterus. Birth control pills are prescribed by a health care provider.Birth control pills can also be used to treat heavy periods.  Minipill. This type of birth control pill contains only the progesterone hormone. They are taken every day of each month and must be prescribed by your health care provider.  Birth control patch. The patch contains hormones similar to those in birth control pills. It must be changed once a week and is prescribed by a health care provider.  Vaginal ring. The ring contains hormones similar to those in birth control pills. It is left in the vagina for 3 weeks, removed for 1 week, and then a new one is put back in place. The patient must be comfortable inserting and removing the ring  from the vagina.A health care provider's prescription is necessary.  Emergency contraception. Emergency contraceptives prevent pregnancy after unprotected sexual intercourse. This pill can be taken right after sex or up to 5 days after unprotected sex. It is most effective the sooner you take the pills after having sexual intercourse. Most emergency contraceptive pills are available without a prescription. Check with your pharmacist. Do not use emergency contraception as your only form of birth control. BARRIER METHODS   Female condom. This is a thin sheath (latex or rubber) that is worn over the penis during sexual intercourse. It can be used with spermicide to increase effectiveness.  Female condom. This is a soft, loose-fitting sheath that is put into the vagina before sexual intercourse.  Diaphragm. This is a soft, latex, dome-shaped barrier that must be fitted by a health care provider. It is inserted into the vagina, along with a spermicidal jelly. It is inserted before intercourse. The diaphragm should be left in the vagina for 6 to 8 hours after intercourse.  Cervical cap. This is a round, soft, latex or plastic cup that fits over the cervix and must be fitted by a health care provider. The cap can be left in place for up to 48 hours after intercourse.  Sponge. This is a soft, circular piece of polyurethane foam. The sponge has spermicide in it. It is inserted into the vagina after wetting it and before sexual intercourse.  Spermicides. These are chemicals that kill or block sperm from entering the cervix and uterus. They come in the form of creams, jellies, suppositories, foam, or tablets. They do not require a   prescription. They are inserted into the vagina with an applicator before having sexual intercourse. The process must be repeated every time you have sexual intercourse. INTRAUTERINE CONTRACEPTION  Intrauterine device (IUD). This is a T-shaped device that is put in a woman's uterus  during a menstrual period to prevent pregnancy. There are 2 types:  Copper IUD. This type of IUD is wrapped in copper wire and is placed inside the uterus. Copper makes the uterus and fallopian tubes produce a fluid that kills sperm. It can stay in place for 10 years.  Hormone IUD. This type of IUD contains the hormone progestin (synthetic progesterone). The hormone thickens the cervical mucus and prevents sperm from entering the uterus, and it also thins the uterine lining to prevent implantation of a fertilized egg. The hormone can weaken or kill the sperm that get into the uterus. It can stay in place for 3-5 years, depending on which type of IUD is used. PERMANENT METHODS OF CONTRACEPTION  Female tubal ligation. This is when the woman's fallopian tubes are surgically sealed, tied, or blocked to prevent the egg from traveling to the uterus.  Hysteroscopic sterilization. This involves placing a small coil or insert into each fallopian tube. Your doctor uses a technique called hysteroscopy to do the procedure. The device causes scar tissue to form. This results in permanent blockage of the fallopian tubes, so the sperm cannot fertilize the egg. It takes about 3 months after the procedure for the tubes to become blocked. You must use another form of birth control for these 3 months.  Female sterilization. This is when the female has the tubes that carry sperm tied off (vasectomy).This blocks sperm from entering the vagina during sexual intercourse. After the procedure, the man can still ejaculate fluid (semen). NATURAL PLANNING METHODS  Natural family planning. This is not having sexual intercourse or using a barrier method (condom, diaphragm, cervical cap) on days the woman could become pregnant.  Calendar method. This is keeping track of the length of each menstrual cycle and identifying when you are fertile.  Ovulation method. This is avoiding sexual intercourse during ovulation.  Symptothermal  method. This is avoiding sexual intercourse during ovulation, using a thermometer and ovulation symptoms.  Post-ovulation method. This is timing sexual intercourse after you have ovulated. Regardless of which type or method of contraception you choose, it is important that you use condoms to protect against the transmission of sexually transmitted infections (STIs). Talk with your health care provider about which form of contraception is most appropriate for you.   This information is not intended to replace advice given to you by your health care provider. Make sure you discuss any questions you have with your health care provider.   Document Released: 08/15/2005 Document Revised: 08/20/2013 Document Reviewed: 02/07/2013 Elsevier Interactive Patient Education [employer] Breastfeeding Deciding to breastfeed is one of the best choices you can make for you and your baby. A change in hormones during pregnancy causes your breast tissue to grow and increases the number and size of your milk ducts. These hormones also allow proteins, sugars, and fats from your blood supply to make breast milk in your milk-producing glands. Hormones prevent breast milk from being released before your baby is born as well as prompt milk flow after birth. Once breastfeeding has begun, thoughts of your baby, as well as his or her sucking or crying, can stimulate the release of milk from your milk-producing glands.  BENEFITS OF BREASTFEEDING For Your Baby  Your  first milk (colostrum) helps your baby's digestive system function better.  There are antibodies in your milk that help your baby fight off infections.  Your baby has a lower incidence of asthma, allergies, and sudden infant death syndrome.  The nutrients in breast milk are better for your baby than infant formulas and are designed uniquely for your baby's needs.  Breast milk improves your baby's brain development.  Your baby is less likely to develop  other conditions, such as childhood obesity, asthma, or type 2 diabetes mellitus. For You  Breastfeeding helps to create a very special bond between you and your baby.  Breastfeeding is convenient. Breast milk is always available at the correct temperature and costs nothing.  Breastfeeding helps to burn calories and helps you lose the weight gained during pregnancy.  Breastfeeding makes your uterus contract to its prepregnancy size faster and slows bleeding (lochia) after you give birth.   Breastfeeding helps to lower your risk of developing type 2 diabetes mellitus, osteoporosis, and breast or ovarian cancer later in life. SIGNS THAT YOUR BABY IS HUNGRY Early Signs of Hunger  Increased alertness or activity.  Stretching.  Movement of the head from side to side.  Movement of the head and opening of the mouth when the corner of the mouth or cheek is stroked (rooting).  Increased sucking sounds, smacking lips, cooing, sighing, or squeaking.  Hand-to-mouth movements.  Increased sucking of fingers or hands. Late Signs of Hunger  Fussing.  Intermittent crying. Extreme Signs of Hunger Signs of extreme hunger will require calming and consoling before your baby will be able to breastfeed successfully. Do not wait for the following signs of extreme hunger to occur before you initiate breastfeeding:  Restlessness.  A loud, strong cry.  Screaming. BREASTFEEDING BASICS Breastfeeding Initiation  Find a comfortable place to sit or lie down, with your neck and back well supported.  Place a pillow or rolled up blanket under your baby to bring him or her to the level of your breast (if you are seated). Nursing pillows are specially designed to help support your arms and your baby while you breastfeed.  Make sure that your baby's abdomen is facing your abdomen.  Gently massage your breast. With your fingertips, massage from your chest wall toward your nipple in a circular motion.  This encourages milk flow. You may need to continue this action during the feeding if your milk flows slowly.  Support your breast with 4 fingers underneath and your thumb above your nipple. Make sure your fingers are well away from your nipple and your baby's mouth.  Stroke your baby's lips gently with your finger or nipple.  When your baby's mouth is open wide enough, quickly bring your baby to your breast, placing your entire nipple and as much of the colored area around your nipple (areola) as possible into your baby's mouth.  More areola should be visible above your baby's upper lip than below the lower lip.  Your baby's tongue should be between his or her lower gum and your breast.  Ensure that your baby's mouth is correctly positioned around your nipple (latched). Your baby's lips should create a seal on your breast and be turned out (everted).  It is common for your baby to suck about 2-3 minutes in order to start the flow of breast milk. Latching Teaching your baby how to latch on to your breast properly is very important. An improper latch can cause nipple pain and decreased milk supply for you  and poor weight gain in your baby. Also, if your baby is not latched onto your nipple properly, he or she may swallow some air during feeding. This can make your baby fussy. Burping your baby when you switch breasts during the feeding can help to get rid of the air. However, teaching your baby to latch on properly is still the best way to prevent fussiness from swallowing air while breastfeeding. Signs that your baby has successfully latched on to your nipple:  Silent tugging or silent sucking, without causing you pain.  Swallowing heard between every 3-4 sucks.  Muscle movement above and in front of his or her ears while sucking. Signs that your baby has not successfully latched on to nipple:  Sucking sounds or smacking sounds from your baby while breastfeeding.  Nipple pain. If you  think your baby has not latched on correctly, slip your finger into the corner of your baby's mouth to break the suction and place it between your baby's gums. Attempt breastfeeding initiation again. Signs of Successful Breastfeeding Signs from your baby:  A gradual decrease in the number of sucks or complete cessation of sucking.  Falling asleep.  Relaxation of his or her body.  Retention of a small amount of milk in his or her mouth.  Letting go of your breast by himself or herself. Signs from you:  Breasts that have increased in firmness, weight, and size 1-3 hours after feeding.  Breasts that are softer immediately after breastfeeding.  Increased milk volume, as well as a change in milk consistency and color by the fifth day of breastfeeding.  Nipples that are not sore, cracked, or bleeding. Signs That Your Pecola Leisure is Getting Enough Milk  Wetting at least 3 diapers in a 24-hour period. The urine should be clear and pale yellow by age 106 days.  At least 3 stools in a 24-hour period by age 106 days. The stool should be soft and yellow.  At least 3 stools in a 24-hour period by age 293 days. The stool should be seedy and yellow.  No loss of weight greater than 10% of birth weight during the first 60 days of age.  Average weight gain of 4-7 ounces (113-198 g) per week after age 60 days.  Consistent daily weight gain by age 106 days, without weight loss after the age of 2 weeks. After a feeding, your baby may spit up a small amount. This is common. BREASTFEEDING FREQUENCY AND DURATION Frequent feeding will help you make more milk and can prevent sore nipples and breast engorgement. Breastfeed when you feel the need to reduce the fullness of your breasts or when your baby shows signs of hunger. This is called "breastfeeding on demand." Avoid introducing a pacifier to your baby while you are working to establish breastfeeding (the first 4-6 weeks after your baby is born). After this time you  may choose to use a pacifier. Research has shown that pacifier use during the first year of a baby's life decreases the risk of sudden infant death syndrome (SIDS). Allow your baby to feed on each breast as long as he or she wants. Breastfeed until your baby is finished feeding. When your baby unlatches or falls asleep while feeding from the first breast, offer the second breast. Because newborns are often sleepy in the first few weeks of life, you may need to awaken your baby to get him or her to feed. Breastfeeding times will vary from baby to baby. However, the following rules  can serve as a guide to help you ensure that your baby is properly fed:  Newborns (babies 12 weeks of age or younger) may breastfeed every 1-3 hours.  Newborns should not go longer than 3 hours during the day or 5 hours during the night without breastfeeding.  You should breastfeed your baby a minimum of 8 times in a 24-hour period until you begin to introduce solid foods to your baby at around 76 months of age. BREAST MILK PUMPING Pumping and storing breast milk allows you to ensure that your baby is exclusively fed your breast milk, even at times when you are unable to breastfeed. This is especially important if you are going back to work while you are still breastfeeding or when you are not able to be present during feedings. Your lactation consultant can give you guidelines on how long it is safe to store breast milk. A breast pump is a machine that allows you to pump milk from your breast into a sterile bottle. The pumped breast milk can then be stored in a refrigerator or freezer. Some breast pumps are operated by hand, while others use electricity. Ask your lactation consultant which type will work best for you. Breast pumps can be purchased, but some hospitals and breastfeeding support groups lease breast pumps on a monthly basis. A lactation consultant can teach you how to hand express breast milk, if you prefer not to use  a pump. CARING FOR YOUR BREASTS WHILE YOU BREASTFEED Nipples can become dry, cracked, and sore while breastfeeding. The following recommendations can help keep your breasts moisturized and healthy:  Avoid using soap on your nipples.  Wear a supportive bra. Although not required, special nursing bras and tank tops are designed to allow access to your breasts for breastfeeding without taking off your entire bra or top. Avoid wearing underwire-style bras or extremely tight bras.  Air dry your nipples for 3-75minutes after each feeding.  Use only cotton bra pads to absorb leaked breast milk. Leaking of breast milk between feedings is normal.  Use lanolin on your nipples after breastfeeding. Lanolin helps to maintain your skin's normal moisture barrier. If you use pure lanolin, you do not need to wash it off before feeding your baby again. Pure lanolin is not toxic to your baby. You may also hand express a few drops of breast milk and gently massage that milk into your nipples and allow the milk to air dry. In the first few weeks after giving birth, some women experience extremely full breasts (engorgement). Engorgement can make your breasts feel heavy, warm, and tender to the touch. Engorgement peaks within 3-5 days after you give birth. The following recommendations can help ease engorgement:  Completely empty your breasts while breastfeeding or pumping. You may want to start by applying warm, moist heat (in the shower or with warm water-soaked hand towels) just before feeding or pumping. This increases circulation and helps the milk flow. If your baby does not completely empty your breasts while breastfeeding, pump any extra milk after he or she is finished.  Wear a snug bra (nursing or regular) or tank top for 1-2 days to signal your body to slightly decrease milk production.  Apply ice packs to your breasts, unless this is too uncomfortable for you.  Make sure that your baby is latched on and  positioned properly while breastfeeding. If engorgement persists after 48 hours of following these recommendations, contact your health care provider or a Advertising copywriter. OVERALL HEALTH CARE  RECOMMENDATIONS WHILE BREASTFEEDING  Eat healthy foods. Alternate between meals and snacks, eating 3 of each per day. Because what you eat affects your breast milk, some of the foods may make your baby more irritable than usual. Avoid eating these foods if you are sure that they are negatively affecting your baby.  Drink milk, fruit juice, and water to satisfy your thirst (about 10 glasses a day).  Rest often, relax, and continue to take your prenatal vitamins to prevent fatigue, stress, and anemia.  Continue breast self-awareness checks.  Avoid chewing and smoking tobacco. Chemicals from cigarettes that pass into breast milk and exposure to secondhand smoke may harm your baby.  Avoid alcohol and drug use, including marijuana. Some medicines that may be harmful to your baby can pass through breast milk. It is important to ask your health care provider before taking any medicine, including all over-the-counter and prescription medicine as well as vitamin and herbal supplements. It is possible to become pregnant while breastfeeding. If birth control is desired, ask your health care provider about options that will be safe for your baby. SEEK MEDICAL CARE IF:  You feel like you want to stop breastfeeding or have become frustrated with breastfeeding.  You have painful breasts or nipples.  Your nipples are cracked or bleeding.  Your breasts are red, tender, or warm.  You have a swollen area on either breast.  You have a fever or chills.  You have nausea or vomiting.  You have drainage other than breast milk from your nipples.  Your breasts do not become full before feedings by the fifth day after you give birth.  You feel sad and depressed.  Your baby is too sleepy to eat well.  Your  baby is having trouble sleeping.   Your baby is wetting less than 3 diapers in a 24-hour period.  Your baby has less than 3 stools in a 24-hour period.  Your baby's skin or the white part of his or her eyes becomes yellow.   Your baby is not gaining weight by 38 days of age. SEEK IMMEDIATE MEDICAL CARE IF:  Your baby is overly tired (lethargic) and does not want to wake up and feed.  Your baby develops an unexplained fever.   This information is not intended to replace advice given to you by your health care provider. Make sure you discuss any questions you have with your health care provider.   Document Released: 08/15/2005 Document Revised: 05/06/2015 Document Reviewed: 02/06/2013 Elsevier Interactive Patient Education 2016 [employer]. Second Trimester of Pregnancy The second trimester is from week 13 through week 28, months 4 through 6. The second trimester is often a time when you feel your best. Your body has also adjusted to being pregnant, and you begin to feel better physically. Usually, morning sickness has lessened or quit completely, you may have more energy, and you may have an increase in appetite. The second trimester is also a time when the fetus is growing rapidly. At the end of the sixth month, the fetus is about 9 inches long and weighs about 1 pounds. You will likely begin to feel the baby move (quickening) between 18 and 20 weeks of the pregnancy. BODY CHANGES Your body goes through many changes during pregnancy. The changes vary from woman to woman.   Your weight will continue to increase. You will notice your lower abdomen bulging out.  You may begin to get stretch marks on your hips, abdomen, and breasts.  You may  develop headaches that can be relieved by medicines approved by your health care provider.  You may urinate more often because the fetus is pressing on your bladder.  You may develop or continue to have heartburn as a result of your  pregnancy.  You may develop constipation because certain hormones are causing the muscles that push waste through your intestines to slow down.  You may develop hemorrhoids or swollen, bulging veins (varicose veins).  You may have back pain because of the weight gain and pregnancy hormones relaxing your joints between the bones in your pelvis and as a result of a shift in weight and the muscles that support your balance.  Your breasts will continue to grow and be tender.  Your gums may bleed and may be sensitive to brushing and flossing.  Dark spots or blotches (chloasma, mask of pregnancy) may develop on your face. This will likely fade after the baby is born.  A dark line from your belly button to the pubic area (linea nigra) may appear. This will likely fade after the baby is born.  You may have changes in your hair. These can include thickening of your hair, rapid growth, and changes in texture. Some women also have hair loss during or after pregnancy, or hair that feels dry or thin. Your hair will most likely return to normal after your baby is born. WHAT TO EXPECT AT YOUR PRENATAL VISITS During a routine prenatal visit:  You will be weighed to make sure you and the fetus are growing normally.  Your blood pressure will be taken.  Your abdomen will be measured to track your baby's growth.  The fetal heartbeat will be listened to.  Any test results from the previous visit will be discussed. Your health care provider may ask you:  How you are feeling.  If you are feeling the baby move.  If you have had any abnormal symptoms, such as leaking fluid, bleeding, severe headaches, or abdominal cramping.  If you are using any tobacco products, including cigarettes, chewing tobacco, and electronic cigarettes.  If you have any questions. Other tests that may be performed during your second trimester include:  Blood tests that check for:  Low iron levels (anemia).  Gestational  diabetes (between 24 and 28 weeks).  Rh antibodies.  Urine tests to check for infections, diabetes, or protein in the urine.  An ultrasound to confirm the proper growth and development of the baby.  An amniocentesis to check for possible genetic problems.  Fetal screens for spina bifida and Down syndrome.  HIV (human immunodeficiency virus) testing. Routine prenatal testing includes screening for HIV, unless you choose not to have this test. HOME CARE INSTRUCTIONS   Avoid all smoking, herbs, alcohol, and unprescribed drugs. These chemicals affect the formation and growth of the baby.  Do not use any tobacco products, including cigarettes, chewing tobacco, and electronic cigarettes. If you need help quitting, ask your health care provider. You may receive counseling support and other resources to help you quit.  Follow your health care provider's instructions regarding medicine use. There are medicines that are either safe or unsafe to take during pregnancy.  Exercise only as directed by your health care provider. Experiencing uterine cramps is a good sign to stop exercising.  Continue to eat regular, healthy meals.  Wear a good support bra for breast tenderness.  Do not use hot tubs, steam rooms, or saunas.  Wear your seat belt at all times when driving.  Avoid raw  meat, uncooked cheese, cat litter boxes, and soil used by cats. These carry germs that can cause birth defects in the baby.  Take your prenatal vitamins.  Take 1500-2000 mg of calcium daily starting at the 20th week of pregnancy until you deliver your baby.  Try taking a stool softener (if your health care provider approves) if you develop constipation. Eat more high-fiber foods, such as fresh vegetables or fruit and whole grains. Drink plenty of fluids to keep your urine clear or pale yellow.  Take warm sitz baths to soothe any pain or discomfort caused by hemorrhoids. Use hemorrhoid cream if your health care  provider approves.  If you develop varicose veins, wear support hose. Elevate your feet for 15 minutes, 3-4 times a day. Limit salt in your diet.  Avoid heavy lifting, wear low heel shoes, and practice good posture.  Rest with your legs elevated if you have leg cramps or low back pain.  Visit your dentist if you have not gone yet during your pregnancy. Use a soft toothbrush to brush your teeth and be gentle when you floss.  A sexual relationship may be continued unless your health care provider directs you otherwise.  Continue to go to all your prenatal visits as directed by your health care provider. SEEK MEDICAL CARE IF:   You have dizziness.  You have mild pelvic cramps, pelvic pressure, or nagging pain in the abdominal area.  You have persistent nausea, vomiting, or diarrhea.  You have a bad smelling vaginal discharge.  You have pain with urination. SEEK IMMEDIATE MEDICAL CARE IF:   You have a fever.  You are leaking fluid from your vagina.  You have spotting or bleeding from your vagina.  You have severe abdominal cramping or pain.  You have rapid weight gain or loss.  You have shortness of breath with chest pain.  You notice sudden or extreme swelling of your face, hands, ankles, feet, or legs.  You have not felt your baby move in over an hour.  You have severe headaches that do not go away with medicine.  You have vision changes.   This information is not intended to replace advice given to you by your health care provider. Make sure you discuss any questions you have with your health care provider.   Document Released: 08/09/2001 Document Revised: 09/05/2014 Document Reviewed: 10/16/2012 Elsevier Interactive Patient Education [employer]

## 2016-05-12 ENCOUNTER — Encounter: Payer: Self-pay | Admitting: Obstetrics and Gynecology

## 2016-05-12 DIAGNOSIS — Z2839 Other underimmunization status: Secondary | ICD-10-CM | POA: Insufficient documentation

## 2016-05-12 DIAGNOSIS — Z283 Underimmunization status: Secondary | ICD-10-CM

## 2016-05-12 DIAGNOSIS — O09899 Supervision of other high risk pregnancies, unspecified trimester: Secondary | ICD-10-CM | POA: Insufficient documentation

## 2016-05-13 LAB — CULTURE, OB URINE

## 2016-05-13 LAB — URINE CULTURE, OB REFLEX

## 2016-05-13 LAB — GC/CHLAMYDIA PROBE AMP
CHLAMYDIA, DNA PROBE: NEGATIVE
Neisseria gonorrhoeae by PCR: NEGATIVE

## 2016-05-18 ENCOUNTER — Encounter: Payer: Self-pay | Admitting: Obstetrics and Gynecology

## 2016-05-18 DIAGNOSIS — O26899 Other specified pregnancy related conditions, unspecified trimester: Secondary | ICD-10-CM | POA: Insufficient documentation

## 2016-05-18 DIAGNOSIS — Z6791 Unspecified blood type, Rh negative: Secondary | ICD-10-CM | POA: Insufficient documentation

## 2016-05-18 LAB — PRENATAL PROFILE I(LABCORP)
Antibody Screen: NEGATIVE
BASOS: 0 %
Basophils Absolute: 0 10*3/uL (ref 0.0–0.2)
EOS (ABSOLUTE): 0.2 10*3/uL (ref 0.0–0.4)
Eos: 2 %
HEMATOCRIT: 37.4 % (ref 34.0–46.6)
Hemoglobin: 12.8 g/dL (ref 11.1–15.9)
Hepatitis B Surface Ag: NEGATIVE
Immature Grans (Abs): 0.1 10*3/uL (ref 0.0–0.1)
Immature Granulocytes: 1 %
Lymphocytes Absolute: 3.2 10*3/uL — ABNORMAL HIGH (ref 0.7–3.1)
Lymphs: 33 %
MCH: 31.1 pg (ref 26.6–33.0)
MCHC: 34.2 g/dL (ref 31.5–35.7)
MCV: 91 fL (ref 79–97)
MONOS ABS: 0.7 10*3/uL (ref 0.1–0.9)
Monocytes: 7 %
NEUTROS ABS: 5.6 10*3/uL (ref 1.4–7.0)
Neutrophils: 57 %
Platelets: 489 10*3/uL — ABNORMAL HIGH (ref 150–379)
RBC: 4.11 x10E6/uL (ref 3.77–5.28)
RDW: 13.4 % (ref 12.3–15.4)
RPR: NONREACTIVE
Rh Factor: NEGATIVE
Rubella Antibodies, IGG: 1.46 index (ref >0.99)
WBC: 9.8 10*3/uL (ref 3.4–10.8)

## 2016-05-18 LAB — VARICELLA ZOSTER ANTIBODY, IGG

## 2016-05-18 LAB — HEMOGLOBINOPATHY EVALUATION
HEMOGLOBIN A2 QUANTITATION: 2.3 % (ref 0.7–3.1)
HGB C: 0 %
HGB S: 0 %
Hemoglobin F Quantitation: 0 % (ref 0.0–2.0)
Hgb A: 97.7 % (ref 94.0–98.0)

## 2016-05-18 LAB — HIV ANTIBODY (ROUTINE TESTING W REFLEX): HIV Screen 4th Generation wRfx: NONREACTIVE

## 2016-05-18 LAB — CYSTIC FIBROSIS MUTATION 97: Interpretation: NOT DETECTED

## 2016-05-18 LAB — TOXASSURE SELECT 13 (MW), URINE

## 2016-06-08 ENCOUNTER — Ambulatory Visit (INDEPENDENT_AMBULATORY_CARE_PROVIDER_SITE_OTHER): Payer: Medicaid Other | Admitting: Obstetrics and Gynecology

## 2016-06-08 ENCOUNTER — Other Ambulatory Visit: Payer: Medicaid Other

## 2016-06-08 ENCOUNTER — Encounter: Payer: Self-pay | Admitting: Obstetrics and Gynecology

## 2016-06-08 VITALS — BP 124/82 | HR 101 | Wt 221.0 lb

## 2016-06-08 DIAGNOSIS — Z6791 Unspecified blood type, Rh negative: Secondary | ICD-10-CM

## 2016-06-08 DIAGNOSIS — O99212 Obesity complicating pregnancy, second trimester: Secondary | ICD-10-CM

## 2016-06-08 DIAGNOSIS — O09899 Supervision of other high risk pregnancies, unspecified trimester: Secondary | ICD-10-CM

## 2016-06-08 DIAGNOSIS — O26899 Other specified pregnancy related conditions, unspecified trimester: Secondary | ICD-10-CM

## 2016-06-08 DIAGNOSIS — O99210 Obesity complicating pregnancy, unspecified trimester: Secondary | ICD-10-CM

## 2016-06-08 DIAGNOSIS — Z2839 Other underimmunization status: Secondary | ICD-10-CM

## 2016-06-08 DIAGNOSIS — Z283 Underimmunization status: Secondary | ICD-10-CM

## 2016-06-08 DIAGNOSIS — Z3402 Encounter for supervision of normal first pregnancy, second trimester: Secondary | ICD-10-CM

## 2016-06-08 DIAGNOSIS — Z3490 Encounter for supervision of normal pregnancy, unspecified, unspecified trimester: Secondary | ICD-10-CM

## 2016-06-08 DIAGNOSIS — E669 Obesity, unspecified: Secondary | ICD-10-CM

## 2016-06-08 NOTE — Patient Instructions (Signed)

## 2016-06-08 NOTE — Addendum Note (Signed)
Addended by: Arne ClevelandHUTCHINSON, Jakyiah Briones J on: 06/08/2016 11:25 AM   Modules accepted: Orders

## 2016-06-08 NOTE — Progress Notes (Signed)
Subjective:  Annette Juarez is a 20 y.o. G2P0 at 1333w3d being seen today for ongoing prenatal care.  She is currently monitored for the following issues for this low-risk pregnancy and has Supervision of normal first pregnancy in second trimester; Obesity affecting pregnancy, antepartum; Susceptible to varicella (non-immune), currently pregnant; and Rh negative, antepartum on her problem list.  Patient reports no complaints.  Contractions: Not present. Vag. Bleeding: None.  Movement: Absent. Denies leaking of fluid.   The following portions of the patient's history were reviewed and updated as appropriate: allergies, current medications, past family history, past medical history, past social history, past surgical history and problem list. Problem list updated.  Objective:   Vitals:   06/08/16 0936  BP: 124/82  Pulse: (!) 101  Weight: 221 lb (100.2 kg)    Fetal Status: Fetal Heart Rate (bpm): 159   Movement: Absent     General:  Alert, oriented and cooperative. Patient is in no acute distress.  Skin: Skin is warm and dry. No rash noted.   Cardiovascular: Normal heart rate noted  Respiratory: Normal respiratory effort, no problems with respiration noted  Abdomen: Soft, gravid, appropriate for gestational age. Pain/Pressure: Present     Pelvic:  Cervical exam deferred        Extremities: Normal range of motion.  Edema: None  Mental Status: Normal mood and affect. Normal behavior. Normal judgment and thought content.   Urinalysis: Urine Protein: Negative Urine Glucose: Negative  Assessment and Plan:  Pregnancy: G2P0 at 5233w3d  1. Supervision of normal first pregnancy in second trimester Declined flu vaccine - US MFM OB COMP + 14 WK; Future  2. Obesity affecting pregnancy, antepartum Glucola today  3. Susceptible to varicella (non-immune), currently pregnant Vaccine PP  4. Rh negative, antepartum Rhogam at 28 weeks  Preterm labor symptoms and general obstetric precautions  including but not limited to vaginal bleeding, contractions, leaking of fluid and fetal movement were reviewed in detail with the patient. Please refer to After Visit Summary for other counseling recommendations.  No Follow-up on file.   Hermina StaggersMichael L Eyleen Rawlinson, MD

## 2016-06-09 LAB — GLUCOSE TOLERANCE, 2 HOURS W/ 1HR
GLUCOSE, 2 HOUR: 96 mg/dL (ref 65–152)
Glucose, 1 hour: 142 mg/dL (ref 65–179)
Glucose, Fasting: 145 mg/dL — ABNORMAL HIGH (ref 65–91)

## 2016-06-10 ENCOUNTER — Other Ambulatory Visit: Payer: Self-pay | Admitting: *Deleted

## 2016-06-10 MED ORDER — PROMETHAZINE HCL 25 MG PO TABS: 25.0000 mg | ORAL_TABLET | Freq: Four times a day (QID) | ORAL | 1 refills | Status: DC | PRN

## 2016-06-10 MED ORDER — PRENATAL VITAMINS 0.8 MG PO TABS: 1.0000 | ORAL_TABLET | Freq: Every day | ORAL | 12 refills | Status: DC

## 2016-06-15 ENCOUNTER — Telehealth: Payer: Self-pay | Admitting: Obstetrics and Gynecology

## 2016-06-15 ENCOUNTER — Other Ambulatory Visit: Payer: Self-pay | Admitting: *Deleted

## 2016-06-15 DIAGNOSIS — O24419 Gestational diabetes mellitus in pregnancy, unspecified control: Secondary | ICD-10-CM

## 2016-06-15 MED ORDER — BLOOD GLUCOSE MONITOR KIT: PACK | 5 refills | Status: DC

## 2016-06-15 NOTE — Telephone Encounter (Signed)
Called pt to let her know that her Rx for her test strips was ready for pickup. Could not leave a message voice mail was not set up.

## 2016-06-21 ENCOUNTER — Encounter (HOSPITAL_COMMUNITY): Payer: Self-pay | Admitting: Obstetrics and Gynecology

## 2016-06-30 ENCOUNTER — Other Ambulatory Visit: Payer: Self-pay | Admitting: Obstetrics and Gynecology

## 2016-06-30 ENCOUNTER — Ambulatory Visit (HOSPITAL_COMMUNITY)
Admission: RE | Admit: 2016-06-30 | Discharge: 2016-06-30 | Disposition: A | Discharge status: Home / Self Care | Payer: Medicaid Other | Source: Ambulatory Visit | Attending: Obstetrics and Gynecology | Admitting: Obstetrics and Gynecology

## 2016-06-30 DIAGNOSIS — Z3A20 20 weeks gestation of pregnancy: Secondary | ICD-10-CM | POA: Diagnosis not present

## 2016-06-30 DIAGNOSIS — Z3402 Encounter for supervision of normal first pregnancy, second trimester: Secondary | ICD-10-CM

## 2016-06-30 DIAGNOSIS — Z363 Encounter for antenatal screening for malformations: Secondary | ICD-10-CM | POA: Insufficient documentation

## 2016-07-04 ENCOUNTER — Other Ambulatory Visit: Payer: Self-pay | Admitting: Obstetrics and Gynecology

## 2016-07-04 ENCOUNTER — Ambulatory Visit: Payer: Medicaid Other | Admitting: Skilled Nursing Facility1

## 2016-07-04 DIAGNOSIS — Z3402 Encounter for supervision of normal first pregnancy, second trimester: Secondary | ICD-10-CM

## 2016-07-06 ENCOUNTER — Encounter: Payer: Medicaid Other | Admitting: Certified Nurse Midwife

## 2016-07-08 ENCOUNTER — Encounter: Payer: Medicaid Other | Admitting: Certified Nurse Midwife

## 2016-07-14 ENCOUNTER — Encounter: Payer: Medicaid Other | Admitting: Certified Nurse Midwife

## 2016-08-01 ENCOUNTER — Other Ambulatory Visit: Payer: Self-pay | Admitting: Obstetrics and Gynecology

## 2016-08-01 ENCOUNTER — Ambulatory Visit (HOSPITAL_COMMUNITY)
Admission: RE | Admit: 2016-08-01 | Discharge: 2016-08-01 | Disposition: A | Discharge status: Home / Self Care | Payer: Medicaid Other | Source: Ambulatory Visit | Attending: Obstetrics and Gynecology | Admitting: Obstetrics and Gynecology

## 2016-08-01 ENCOUNTER — Encounter: Payer: Medicaid Other | Admitting: Certified Nurse Midwife

## 2016-08-01 DIAGNOSIS — Z3A24 24 weeks gestation of pregnancy: Secondary | ICD-10-CM | POA: Diagnosis not present

## 2016-08-01 DIAGNOSIS — Z3402 Encounter for supervision of normal first pregnancy, second trimester: Secondary | ICD-10-CM

## 2016-08-01 DIAGNOSIS — Z362 Encounter for other antenatal screening follow-up: Secondary | ICD-10-CM | POA: Diagnosis present

## 2016-08-04 ENCOUNTER — Ambulatory Visit (INDEPENDENT_AMBULATORY_CARE_PROVIDER_SITE_OTHER): Payer: Medicaid Other | Admitting: Certified Nurse Midwife

## 2016-08-04 VITALS — BP 136/84 | HR 105 | Wt 219.0 lb

## 2016-08-04 DIAGNOSIS — O99210 Obesity complicating pregnancy, unspecified trimester: Secondary | ICD-10-CM

## 2016-08-04 DIAGNOSIS — O98811 Other maternal infectious and parasitic diseases complicating pregnancy, first trimester: Secondary | ICD-10-CM

## 2016-08-04 DIAGNOSIS — Z6791 Unspecified blood type, Rh negative: Secondary | ICD-10-CM

## 2016-08-04 DIAGNOSIS — Z283 Underimmunization status: Secondary | ICD-10-CM

## 2016-08-04 DIAGNOSIS — Z2839 Other underimmunization status: Secondary | ICD-10-CM

## 2016-08-04 DIAGNOSIS — O09899 Supervision of other high risk pregnancies, unspecified trimester: Secondary | ICD-10-CM

## 2016-08-04 DIAGNOSIS — O24419 Gestational diabetes mellitus in pregnancy, unspecified control: Secondary | ICD-10-CM | POA: Insufficient documentation

## 2016-08-04 DIAGNOSIS — O26899 Other specified pregnancy related conditions, unspecified trimester: Secondary | ICD-10-CM

## 2016-08-04 DIAGNOSIS — B3731 Acute candidiasis of vulva and vagina: Secondary | ICD-10-CM

## 2016-08-04 DIAGNOSIS — O99211 Obesity complicating pregnancy, first trimester: Secondary | ICD-10-CM

## 2016-08-04 DIAGNOSIS — Z3402 Encounter for supervision of normal first pregnancy, second trimester: Secondary | ICD-10-CM

## 2016-08-04 DIAGNOSIS — B373 Candidiasis of vulva and vagina: Secondary | ICD-10-CM

## 2016-08-04 DIAGNOSIS — E669 Obesity, unspecified: Secondary | ICD-10-CM

## 2016-08-04 DIAGNOSIS — O24410 Gestational diabetes mellitus in pregnancy, diet controlled: Secondary | ICD-10-CM

## 2016-08-04 MED ORDER — TERCONAZOLE 0.8 % VA CREA: 1.0000 | TOPICAL_CREAM | Freq: Every day | VAGINAL | 0 refills | Status: DC

## 2016-08-04 MED ORDER — FLUCONAZOLE 150 MG PO TABS: 150.0000 mg | ORAL_TABLET | Freq: Once | ORAL | 0 refills | Status: AC

## 2016-08-04 NOTE — Progress Notes (Signed)
Diabetic testing supplies called to pharmacy. Pt instructed on how to test glucose daily.

## 2016-08-04 NOTE — Progress Notes (Signed)
Subjective:    Annette Juarez is a 20 y.o. female being seen today for her obstetrical visit. She is at 6817w4d gestation. Patient reports: no bleeding, no contractions, no cramping, no leaking and vaginal irritation . Fetal movement: normal.  Problem List Items Addressed This Visit      Endocrine   GDM (gestational diabetes mellitus)   Relevant Orders   AMB referral to maternal fetal medicine   Amb Referral to Nutrition and Diabetic E     Other   Supervision of normal first pregnancy in second trimester   Obesity affecting pregnancy, antepartum - Primary   Susceptible to varicella (non-immune), currently pregnant   Rh negative, antepartum    Other Visit Diagnoses    Yeast vaginitis       Relevant Medications   fluconazole (DIFLUCAN) 150 MG tablet   terconazole (TERAZOL 3) 0.8 % vaginal cream   Other Relevant Orders   NuSwab Vaginitis Plus (VG+)     Patient Active Problem List   Diagnosis Date Noted  . GDM (gestational diabetes mellitus) 08/04/2016  . Rh negative, antepartum 05/18/2016  . Susceptible to varicella (non-immune), currently pregnant 05/12/2016  . Supervision of normal first pregnancy in second trimester 05/11/2016  . Obesity affecting pregnancy, antepartum 05/11/2016   Objective:    BP 136/84   Pulse (!) 105   Wt 219 lb (99.3 kg)   LMP 02/10/2016 Comment: states period is abnormal  BMI 38.79 kg/m  FHT: 155 BPM  Uterine Size: 24 cm and size equals dates     Assessment:    Pregnancy @ 6917w4d    RH- state  Yeast vaginitis  GDM: has not gotten meter/strips/lancets  Plan:   MFM consult/dietician consult placed.   OBGCT: discussed and ordered for next visit. Signs and symptoms of preterm labor: discussed and handout given.  Labs, problem list reviewed and updated 2 hr GTT planned Follow up in 3 weeks.

## 2016-08-07 LAB — NUSWAB VAGINITIS PLUS (VG+)
Atopobium vaginae: HIGH Score — AB
BVAB 2: HIGH Score — AB
CHLAMYDIA TRACHOMATIS, NAA: NEGATIVE
Candida albicans, NAA: POSITIVE — AB
Candida glabrata, NAA: NEGATIVE
Megasphaera 1: HIGH Score — AB
NEISSERIA GONORRHOEAE, NAA: NEGATIVE
Trich vag by NAA: NEGATIVE

## 2016-08-12 ENCOUNTER — Other Ambulatory Visit: Payer: Self-pay | Admitting: Certified Nurse Midwife

## 2016-08-12 DIAGNOSIS — B3731 Acute candidiasis of vulva and vagina: Secondary | ICD-10-CM

## 2016-08-12 DIAGNOSIS — B9689 Other specified bacterial agents as the cause of diseases classified elsewhere: Secondary | ICD-10-CM

## 2016-08-12 DIAGNOSIS — B373 Candidiasis of vulva and vagina: Secondary | ICD-10-CM

## 2016-08-12 DIAGNOSIS — N760 Acute vaginitis: Secondary | ICD-10-CM

## 2016-08-12 MED ORDER — FLUCONAZOLE 150 MG PO TABS: 150.0000 mg | ORAL_TABLET | Freq: Once | ORAL | 0 refills | Status: AC

## 2016-08-12 MED ORDER — METRONIDAZOLE 500 MG PO TABS: 500.0000 mg | ORAL_TABLET | Freq: Two times a day (BID) | ORAL | 0 refills | Status: DC

## 2016-08-25 ENCOUNTER — Encounter: Payer: Medicaid Other | Admitting: Certified Nurse Midwife

## 2016-08-29 NOTE — L&D Delivery Note (Signed)
Vaginal Delivery Note  21 y.o. G1P0 at 5732w2d delivered a viable female infant at 1404 in cephalic, LOA position. No nuchal cord. Right anterior shoulder delivered with ease. 60 sec delayed cord clamping. Cord clamped x2 and cut. Placenta delivered spontaneously intact, with 3VC. Fundus firm on exam with massage and pitocin.  Mother: Anesthesia: Epidural Laceration: 1st degree Suture repair: 2.0 Vicryl EBL: 450 mL  Baby: Apgars: 8, 9 Weight: pending Cord pH: Not sent  Good hemostasis noted. Mom to postpartum.  Baby to Couplet care / Skin to Skin.  Gwenevere AbbotNimeka Kadeja Granada, MD, PGY-2 11/15/2016, 3:00 PM

## 2016-08-31 ENCOUNTER — Other Ambulatory Visit: Payer: Self-pay | Admitting: Certified Nurse Midwife

## 2016-08-31 DIAGNOSIS — O24419 Gestational diabetes mellitus in pregnancy, unspecified control: Secondary | ICD-10-CM

## 2016-09-05 ENCOUNTER — Ambulatory Visit: Payer: Medicaid Other | Admitting: Skilled Nursing Facility1

## 2016-09-06 ENCOUNTER — Encounter: Payer: Self-pay | Admitting: Obstetrics

## 2016-09-06 ENCOUNTER — Telehealth: Payer: Self-pay

## 2016-09-06 ENCOUNTER — Ambulatory Visit (INDEPENDENT_AMBULATORY_CARE_PROVIDER_SITE_OTHER): Payer: Medicaid Other | Admitting: Obstetrics

## 2016-09-06 ENCOUNTER — Encounter: Payer: Medicaid Other | Admitting: Obstetrics and Gynecology

## 2016-09-06 ENCOUNTER — Other Ambulatory Visit (HOSPITAL_COMMUNITY)
Admission: RE | Admit: 2016-09-06 | Discharge: 2016-09-06 | Disposition: A | Discharge status: Home / Self Care | Payer: Medicaid Other | Source: Ambulatory Visit | Attending: Obstetrics | Admitting: Obstetrics

## 2016-09-06 VITALS — BP 123/81 | HR 116 | Temp 98.2°F | Wt 217.0 lb

## 2016-09-06 DIAGNOSIS — N898 Other specified noninflammatory disorders of vagina: Secondary | ICD-10-CM | POA: Insufficient documentation

## 2016-09-06 DIAGNOSIS — J069 Acute upper respiratory infection, unspecified: Secondary | ICD-10-CM | POA: Diagnosis not present

## 2016-09-06 DIAGNOSIS — R300 Dysuria: Principal | ICD-10-CM

## 2016-09-06 DIAGNOSIS — O26893 Other specified pregnancy related conditions, third trimester: Secondary | ICD-10-CM

## 2016-09-06 DIAGNOSIS — Z3480 Encounter for supervision of other normal pregnancy, unspecified trimester: Secondary | ICD-10-CM

## 2016-09-06 DIAGNOSIS — O360930 Maternal care for other rhesus isoimmunization, third trimester, not applicable or unspecified: Secondary | ICD-10-CM

## 2016-09-06 DIAGNOSIS — Z3402 Encounter for supervision of normal first pregnancy, second trimester: Secondary | ICD-10-CM

## 2016-09-06 LAB — POCT URINALYSIS DIPSTICK
BILIRUBIN UA: NEGATIVE
Glucose, UA: NEGATIVE
KETONES UA: NEGATIVE
Nitrite, UA: NEGATIVE
PH UA: 6
Protein, UA: NEGATIVE
RBC UA: NEGATIVE
Spec Grav, UA: 1.01
Urobilinogen, UA: 0.2

## 2016-09-06 LAB — POCT RAPID STREP A (OFFICE): RAPID STREP A SCREEN: NEGATIVE

## 2016-09-06 MED ORDER — RHO D IMMUNE GLOBULIN 1500 UNITS IM SOSY
1500.0000 [IU] | PREFILLED_SYRINGE | Freq: Once | INTRAMUSCULAR | Status: AC
  Administered 2016-09-06: 1500 [IU] via INTRAMUSCULAR

## 2016-09-06 NOTE — Telephone Encounter (Signed)
Informed pt flu test is negative.

## 2016-09-06 NOTE — Addendum Note (Signed)
Addended by: Dalphine HandingGARDNER, Adrain Butrick L on: 09/06/2016 04:39 PM   Modules accepted: Orders

## 2016-09-06 NOTE — Progress Notes (Signed)
Subjective:    Annette Juarez is a 21 y.o. female being seen today for her obstetrical visit. She is at 5642w2d gestation. Patient reports ruuny nose and congestion.  No fever.  Fetal movement: normal.  Problem List Items Addressed This Visit    Supervision of normal first pregnancy in second trimester    Other Visit Diagnoses    Dysuria during pregnancy in third trimester    -  Primary   Relevant Orders   POCT urinalysis dipstick (Completed)   Upper respiratory infection, acute       Relevant Orders   POCT rapid strep A (Completed)   Influenza a and b   Vaginal irritation       Relevant Orders   Cervicovaginal ancillary only     Patient Active Problem List   Diagnosis Date Noted  . GDM (gestational diabetes mellitus) 08/04/2016  . Rh negative, antepartum 05/18/2016  . Susceptible to varicella (non-immune), currently pregnant 05/12/2016  . Supervision of normal first pregnancy in second trimester 05/11/2016  . Obesity affecting pregnancy, antepartum 05/11/2016   Objective:    BP 123/81   Pulse (!) 116   Temp 98.2 F (36.8 C)   Wt 217 lb (98.4 kg)   LMP 02/10/2016 Comment: states period is abnormal  BMI 38.44 kg/m  FHT:  150 BPM  Uterine Size: size equals dates  Presentation: unsure     Assessment:    Pregnancy @ 5842w2d weeks    URI  Plan:    Comfort measures encouraged for URI ( rest in bed, chicken soup, increased fluids )   labs reviewed, problem list updated Consent signed. GBS sent TDAP offered  Rhogam given for RH negative Pediatrician: discussed. Infant feeding: plans to breastfeed. Maternity leave: discussed. Cigarette smoking: never smoked. Orders Placed This Encounter  Procedures  . Influenza a and b    Please call stat results to Dr. Clearance CootsHarper [phone]  . POCT rapid strep A  . POCT urinalysis dipstick   No orders of the defined types were placed in this encounter.  Follow up in 2 Weeks.   Patient ID: Annette Juarez, female   DOB:  May 05, 1996, 21 y.o.   MRN: [id number]

## 2016-09-06 NOTE — Progress Notes (Signed)
Pt c/o runny nose, sore throat, hot n cold, chills x 3 days. Pt also c/o inner vaginal irritation, outer vaginal itching, and vaginal burning x 1 week. Pt did not bring CBG log today but states sugars have been good except one elevated level of 198.

## 2016-09-07 LAB — PLEASE NOTE:

## 2016-09-07 LAB — INFLUENZA A AND B
Influenza A Ag, EIA: NEGATIVE
Influenza B Ag, EIA: NEGATIVE

## 2016-09-08 LAB — CERVICOVAGINAL ANCILLARY ONLY
Bacterial vaginitis: NEGATIVE
CANDIDA VAGINITIS: POSITIVE
Chlamydia: NEGATIVE
Neisseria Gonorrhea: NEGATIVE
Trichomonas: NEGATIVE

## 2016-09-20 ENCOUNTER — Ambulatory Visit (INDEPENDENT_AMBULATORY_CARE_PROVIDER_SITE_OTHER): Payer: Medicaid Other | Admitting: Certified Nurse Midwife

## 2016-09-20 VITALS — BP 118/78 | HR 110 | Temp 98.0°F | Wt 217.0 lb

## 2016-09-20 DIAGNOSIS — O24410 Gestational diabetes mellitus in pregnancy, diet controlled: Secondary | ICD-10-CM

## 2016-09-20 DIAGNOSIS — Z283 Underimmunization status: Secondary | ICD-10-CM

## 2016-09-20 DIAGNOSIS — O99210 Obesity complicating pregnancy, unspecified trimester: Secondary | ICD-10-CM

## 2016-09-20 DIAGNOSIS — R69 Illness, unspecified: Secondary | ICD-10-CM

## 2016-09-20 DIAGNOSIS — Z2839 Other underimmunization status: Secondary | ICD-10-CM

## 2016-09-20 DIAGNOSIS — J111 Influenza due to unidentified influenza virus with other respiratory manifestations: Secondary | ICD-10-CM

## 2016-09-20 DIAGNOSIS — O99213 Obesity complicating pregnancy, third trimester: Secondary | ICD-10-CM

## 2016-09-20 DIAGNOSIS — O09899 Supervision of other high risk pregnancies, unspecified trimester: Secondary | ICD-10-CM

## 2016-09-20 DIAGNOSIS — Z6791 Unspecified blood type, Rh negative: Secondary | ICD-10-CM

## 2016-09-20 DIAGNOSIS — O0990 Supervision of high risk pregnancy, unspecified, unspecified trimester: Secondary | ICD-10-CM

## 2016-09-20 DIAGNOSIS — O26899 Other specified pregnancy related conditions, unspecified trimester: Secondary | ICD-10-CM

## 2016-09-20 NOTE — Progress Notes (Signed)
   PRENATAL VISIT NOTE  Subjective:  Annette Juarez is a 21 y.o. G2P0 at 2266w2d being seen today for ongoing prenatal care.  She is currently monitored for the following issues for this high-risk pregnancy and has Supervision of high risk pregnancy, antepartum; Obesity affecting pregnancy, antepartum; Susceptible to varicella (non-immune), currently pregnant; Rh negative, antepartum; and GDM (gestational diabetes mellitus) on her problem list.  Patient reports no bleeding, no contractions, no cramping, no leaking and uri. URI symptoms, denies fever, reports nasal congestion, cough, no sputum production.  FOB is sick as well.   Contractions: Not present. Vag. Bleeding: None.  Movement: Present. Denies leaking of fluid.   The following portions of the patient's history were reviewed and updated as appropriate: allergies, current medications, past family history, past medical history, past social history, past surgical history and problem list. Problem list updated.  Objective:   Vitals:   09/20/16 1532  BP: 118/78  Pulse: (!) 110  Temp: 98 F (36.7 C)  Weight: 217 lb (98.4 kg)    Fetal Status: Fetal Heart Rate (bpm): 152 Fundal Height: 32 cm Movement: Present     General:  Alert, oriented and cooperative. Patient is in no acute distress.  Skin: Skin is warm and dry. No rash noted.   Cardiovascular: Normal heart rate noted  Respiratory: Normal respiratory effort, no problems with respiration noted  Abdomen: Soft, gravid, appropriate for gestational age. Pain/Pressure: Present     Pelvic:  Cervical exam deferred        Extremities: Normal range of motion.  Edema: None  Mental Status: Normal mood and affect. Normal behavior. Normal judgment and thought content.   Assessment and Plan:  Pregnancy: G2P0 at 2266w2d  1. Diet controlled gestational diabetes mellitus (GDM) in third trimester     Reports having meter, strips, lancets: has not attended classes yet. - AMB referral to maternal  fetal medicine - Amb Referral to Nutrition and Diabetic E - US MFM OB FOLLOW UP; Future  2. Obesity affecting pregnancy, antepartum     US: 08/01/16: EFW: 40%  3. Rh negative, antepartum     Rhogam given: 09/06/16  4. Susceptible to varicella (non-immune), currently pregnant     Needs varicella postpartum  5. Supervision of high risk pregnancy, antepartum      Acute ILI/URI: rapid flu obtained.   Preterm labor symptoms and general obstetric precautions including but not limited to vaginal bleeding, contractions, leaking of fluid and fetal movement were reviewed in detail with the patient. Please refer to After Visit Summary for other counseling recommendations.  Return in about 2 weeks (around 10/04/2016) for ROB.   Annette Juarez, CNM

## 2016-09-21 LAB — INFLUENZA A AND B
INFLUENZA A AG, EIA: NEGATIVE
Influenza B Ag, EIA: NEGATIVE

## 2016-09-21 LAB — PLEASE NOTE:

## 2016-09-29 ENCOUNTER — Other Ambulatory Visit: Payer: Self-pay | Admitting: Certified Nurse Midwife

## 2016-09-29 ENCOUNTER — Ambulatory Visit (HOSPITAL_COMMUNITY)
Admission: RE | Admit: 2016-09-29 | Discharge: 2016-09-29 | Disposition: A | Discharge status: Home / Self Care | Payer: Medicaid Other | Source: Ambulatory Visit | Attending: Certified Nurse Midwife | Admitting: Certified Nurse Midwife

## 2016-09-29 ENCOUNTER — Encounter (HOSPITAL_COMMUNITY): Payer: Self-pay

## 2016-09-29 ENCOUNTER — Encounter: Payer: Medicaid Other | Attending: Certified Nurse Midwife | Admitting: *Deleted

## 2016-09-29 DIAGNOSIS — O24415 Gestational diabetes mellitus in pregnancy, controlled by oral hypoglycemic drugs: Secondary | ICD-10-CM | POA: Insufficient documentation

## 2016-09-29 DIAGNOSIS — Z3A33 33 weeks gestation of pregnancy: Secondary | ICD-10-CM | POA: Diagnosis not present

## 2016-09-29 DIAGNOSIS — R7309 Other abnormal glucose: Secondary | ICD-10-CM

## 2016-09-29 DIAGNOSIS — Z3A32 32 weeks gestation of pregnancy: Secondary | ICD-10-CM

## 2016-09-29 DIAGNOSIS — O99213 Obesity complicating pregnancy, third trimester: Secondary | ICD-10-CM | POA: Insufficient documentation

## 2016-09-29 DIAGNOSIS — Z362 Encounter for other antenatal screening follow-up: Secondary | ICD-10-CM | POA: Insufficient documentation

## 2016-09-29 DIAGNOSIS — O24419 Gestational diabetes mellitus in pregnancy, unspecified control: Secondary | ICD-10-CM | POA: Diagnosis not present

## 2016-09-29 DIAGNOSIS — Z713 Dietary counseling and surveillance: Secondary | ICD-10-CM | POA: Diagnosis present

## 2016-09-29 DIAGNOSIS — O0990 Supervision of high risk pregnancy, unspecified, unspecified trimester: Secondary | ICD-10-CM

## 2016-09-29 DIAGNOSIS — O24410 Gestational diabetes mellitus in pregnancy, diet controlled: Secondary | ICD-10-CM

## 2016-09-29 NOTE — Progress Notes (Signed)
  Patient was seen on 09/29/2016 for Gestational Diabetes self-management . The following learning objectives were met by the patient :   States the definition of Gestational Diabetes  States why dietary management is important in controlling blood glucose  Describes the effects of carbohydrates on blood glucose levels  Demonstrates ability to create a balanced meal plan  Demonstrates carbohydrate counting   States when to check blood glucose levels  Demonstrates proper blood glucose monitoring techniques  States the effect of stress and exercise on blood glucose levels  States the importance of limiting caffeine and abstaining from alcohol and smoking  Plan:  Aim for 3 Carb Choices per meal (45 grams) +/- 1 either way  Aim for 1-2 Carbs per snack Begin reading food labels for Total Carbohydrate of foods Consider  increasing your activity level by walking or other activity daily as tolerated Begin checking BG before breakfast and 2 hours after first bite of breakfast, lunch and dinner as directed by MD  Take medication if directed by MD  Patient states blood glucose monitor Rx was called into pharmacy by MD office but kit didn't come with strips so patient is trying to get Rx updated to include the strips too. Patient instructed to test pre breakfast and 2 hours each meal as directed by MD Bring Log Book to every medical appointment   Patient instructed to monitor glucose levels: FBS: 60 - <90 2 hour: <120  Patient received the following handouts:  Nutrition Diabetes and Pregnancy  Carbohydrate Counting List  Patient will be seen for follow-up as needed.

## 2016-10-05 ENCOUNTER — Encounter: Payer: Medicaid Other | Admitting: Certified Nurse Midwife

## 2016-10-12 ENCOUNTER — Encounter: Payer: Medicaid Other | Admitting: Certified Nurse Midwife

## 2016-10-20 ENCOUNTER — Encounter: Payer: Medicaid Other | Admitting: Certified Nurse Midwife

## 2016-10-27 ENCOUNTER — Encounter: Payer: Medicaid Other | Admitting: Certified Nurse Midwife

## 2016-11-14 ENCOUNTER — Inpatient Hospital Stay (HOSPITAL_COMMUNITY)
Admission: AD | Admit: 2016-11-14 | Discharge: 2016-11-17 | DRG: 775 | Disposition: A | Discharge status: Home / Self Care | Payer: Medicaid Other | Source: Ambulatory Visit | Attending: Family Medicine | Admitting: Family Medicine

## 2016-11-14 ENCOUNTER — Encounter: Payer: Self-pay | Admitting: Certified Nurse Midwife

## 2016-11-14 ENCOUNTER — Ambulatory Visit (INDEPENDENT_AMBULATORY_CARE_PROVIDER_SITE_OTHER): Payer: Medicaid Other | Admitting: Certified Nurse Midwife

## 2016-11-14 ENCOUNTER — Encounter (HOSPITAL_COMMUNITY): Payer: Self-pay

## 2016-11-14 VITALS — BP 130/87 | HR 105 | Temp 98.1°F | Wt 223.8 lb

## 2016-11-14 DIAGNOSIS — O26893 Other specified pregnancy related conditions, third trimester: Secondary | ICD-10-CM | POA: Diagnosis present

## 2016-11-14 DIAGNOSIS — O360930 Maternal care for other rhesus isoimmunization, third trimester, not applicable or unspecified: Secondary | ICD-10-CM | POA: Diagnosis not present

## 2016-11-14 DIAGNOSIS — O134 Gestational [pregnancy-induced] hypertension without significant proteinuria, complicating childbirth: Secondary | ICD-10-CM | POA: Diagnosis present

## 2016-11-14 DIAGNOSIS — Z833 Family history of diabetes mellitus: Secondary | ICD-10-CM

## 2016-11-14 DIAGNOSIS — O0990 Supervision of high risk pregnancy, unspecified, unspecified trimester: Secondary | ICD-10-CM

## 2016-11-14 DIAGNOSIS — Z3A39 39 weeks gestation of pregnancy: Secondary | ICD-10-CM | POA: Diagnosis not present

## 2016-11-14 DIAGNOSIS — Z6791 Unspecified blood type, Rh negative: Secondary | ICD-10-CM

## 2016-11-14 DIAGNOSIS — O24420 Gestational diabetes mellitus in childbirth, diet controlled: Secondary | ICD-10-CM | POA: Diagnosis present

## 2016-11-14 DIAGNOSIS — O99213 Obesity complicating pregnancy, third trimester: Secondary | ICD-10-CM

## 2016-11-14 DIAGNOSIS — O09899 Supervision of other high risk pregnancies, unspecified trimester: Secondary | ICD-10-CM

## 2016-11-14 DIAGNOSIS — Z2839 Other underimmunization status: Secondary | ICD-10-CM

## 2016-11-14 DIAGNOSIS — Z8249 Family history of ischemic heart disease and other diseases of the circulatory system: Secondary | ICD-10-CM

## 2016-11-14 DIAGNOSIS — O99824 Streptococcus B carrier state complicating childbirth: Secondary | ICD-10-CM | POA: Diagnosis present

## 2016-11-14 DIAGNOSIS — O0933 Supervision of pregnancy with insufficient antenatal care, third trimester: Secondary | ICD-10-CM

## 2016-11-14 DIAGNOSIS — O24429 Gestational diabetes mellitus in childbirth, unspecified control: Secondary | ICD-10-CM | POA: Diagnosis not present

## 2016-11-14 DIAGNOSIS — Z3493 Encounter for supervision of normal pregnancy, unspecified, third trimester: Secondary | ICD-10-CM | POA: Diagnosis present

## 2016-11-14 DIAGNOSIS — O99210 Obesity complicating pregnancy, unspecified trimester: Secondary | ICD-10-CM

## 2016-11-14 DIAGNOSIS — O24410 Gestational diabetes mellitus in pregnancy, diet controlled: Secondary | ICD-10-CM

## 2016-11-14 DIAGNOSIS — E669 Obesity, unspecified: Secondary | ICD-10-CM

## 2016-11-14 DIAGNOSIS — O26899 Other specified pregnancy related conditions, unspecified trimester: Secondary | ICD-10-CM

## 2016-11-14 DIAGNOSIS — Z283 Underimmunization status: Secondary | ICD-10-CM

## 2016-11-14 DIAGNOSIS — O24419 Gestational diabetes mellitus in pregnancy, unspecified control: Secondary | ICD-10-CM | POA: Diagnosis present

## 2016-11-14 LAB — COMPREHENSIVE METABOLIC PANEL
ALK PHOS: 121 U/L (ref 38–126)
ALT: 8 U/L — ABNORMAL LOW (ref 14–54)
ANION GAP: 4 — AB (ref 5–15)
AST: 14 U/L — ABNORMAL LOW (ref 15–41)
Albumin: 2.8 g/dL — ABNORMAL LOW (ref 3.5–5.0)
BILIRUBIN TOTAL: 0.5 mg/dL (ref 0.3–1.2)
BUN: <5 mg/dL — ABNORMAL LOW (ref 6–20)
CALCIUM: 8.6 mg/dL — AB (ref 8.9–10.3)
CO2: 26 mmol/L (ref 22–32)
Chloride: 105 mmol/L (ref 101–111)
Creatinine, Ser: 0.37 mg/dL — ABNORMAL LOW (ref 0.44–1.00)
Glucose, Bld: 86 mg/dL (ref 65–99)
POTASSIUM: 4.2 mmol/L (ref 3.5–5.1)
Sodium: 135 mmol/L (ref 135–145)
Total Protein: 6.3 g/dL — ABNORMAL LOW (ref 6.5–8.1)

## 2016-11-14 LAB — CBC
HEMATOCRIT: 36.6 % (ref 36.0–46.0)
Hemoglobin: 12.6 g/dL (ref 12.0–15.0)
MCH: 31.9 pg (ref 26.0–34.0)
MCHC: 34.4 g/dL (ref 30.0–36.0)
MCV: 92.7 fL (ref 78.0–100.0)
PLATELETS: 380 10*3/uL (ref 150–400)
RBC: 3.95 MIL/uL (ref 3.87–5.11)
RDW: 13.5 % (ref 11.5–15.5)
WBC: 10.4 10*3/uL (ref 4.0–10.5)

## 2016-11-14 LAB — TYPE AND SCREEN
ABO/RH(D): O NEG
Antibody Screen: NEGATIVE

## 2016-11-14 LAB — POCT CBG (FASTING - GLUCOSE)-MANUAL ENTRY: Glucose Fasting, POC: 108 mg/dL — AB (ref 70–99)

## 2016-11-14 LAB — OB RESULTS CONSOLE GBS: STREP GROUP B AG: POSITIVE

## 2016-11-14 LAB — GROUP B STREP BY PCR: GROUP B STREP BY PCR: POSITIVE — AB

## 2016-11-14 LAB — GLUCOSE, CAPILLARY: Glucose-Capillary: 73 mg/dL (ref 65–99)

## 2016-11-14 MED ORDER — OXYCODONE-ACETAMINOPHEN 5-325 MG PO TABS: 2.0000 | ORAL_TABLET | ORAL | Status: DC | PRN

## 2016-11-14 MED ORDER — OXYCODONE-ACETAMINOPHEN 5-325 MG PO TABS: 1.0000 | ORAL_TABLET | ORAL | Status: DC | PRN

## 2016-11-14 MED ORDER — TERBUTALINE SULFATE 1 MG/ML IJ SOLN: 0.2500 mg | Freq: Once | INTRAMUSCULAR | Status: DC | PRN

## 2016-11-14 MED ORDER — LACTATED RINGERS IV SOLN: 500.0000 mL | INTRAVENOUS | Status: DC | PRN

## 2016-11-14 MED ORDER — ACETAMINOPHEN 325 MG PO TABS: 650.0000 mg | ORAL_TABLET | ORAL | Status: DC | PRN

## 2016-11-14 MED ORDER — OXYTOCIN 40 UNITS IN LACTATED RINGERS INFUSION - SIMPLE MED: 2.5000 [IU]/h | INTRAVENOUS | Status: DC

## 2016-11-14 MED ORDER — DEXTROSE 5 % IV SOLN: 5.0000 10*6.[IU] | Freq: Once | INTRAVENOUS | Status: DC

## 2016-11-14 MED ORDER — PENICILLIN G POTASSIUM 5000000 UNITS IJ SOLR
5.0000 10*6.[IU] | Freq: Once | INTRAVENOUS | Status: AC
  Administered 2016-11-14: 5 10*6.[IU] via INTRAVENOUS
  Filled 2016-11-14: qty 5

## 2016-11-14 MED ORDER — OXYTOCIN BOLUS FROM INFUSION: 500.0000 mL | Freq: Once | INTRAVENOUS | Status: DC

## 2016-11-14 MED ORDER — PENICILLIN G POT IN DEXTROSE 60000 UNIT/ML IV SOLN: 3.0000 10*6.[IU] | INTRAVENOUS | Status: DC

## 2016-11-14 MED ORDER — OXYCODONE-ACETAMINOPHEN 5-325 MG PO TABS
2.0000 | ORAL_TABLET | ORAL | Status: DC | PRN
Start: 2016-11-14 — End: 2016-11-14

## 2016-11-14 MED ORDER — BUTORPHANOL TARTRATE 1 MG/ML IJ SOLN
1.0000 mg | INTRAMUSCULAR | Status: DC | PRN
  Administered 2016-11-15: 1 mg via INTRAVENOUS
  Filled 2016-11-14: qty 1

## 2016-11-14 MED ORDER — FLEET ENEMA 7-19 GM/118ML RE ENEM
1.0000 | ENEMA | RECTAL | Status: DC | PRN
Start: 2016-11-14 — End: 2016-11-14

## 2016-11-14 MED ORDER — LIDOCAINE HCL (PF) 1 % IJ SOLN
30.0000 mL | INTRAMUSCULAR | Status: DC | PRN
  Filled 2016-11-14: qty 30

## 2016-11-14 MED ORDER — ONDANSETRON HCL 4 MG/2ML IJ SOLN
4.0000 mg | Freq: Four times a day (QID) | INTRAMUSCULAR | Status: DC | PRN
  Administered 2016-11-14 – 2016-11-15 (×2): 4 mg via INTRAVENOUS
  Filled 2016-11-14 (×2): qty 2

## 2016-11-14 MED ORDER — SOD CITRATE-CITRIC ACID 500-334 MG/5ML PO SOLN: 30.0000 mL | ORAL | Status: DC | PRN

## 2016-11-14 MED ORDER — OXYTOCIN BOLUS FROM INFUSION
500.0000 mL | Freq: Once | INTRAVENOUS | Status: AC
  Administered 2016-11-15: 500 mL via INTRAVENOUS

## 2016-11-14 MED ORDER — MISOPROSTOL 25 MCG QUARTER TABLET
25.0000 ug | ORAL_TABLET | ORAL | Status: DC | PRN
  Administered 2016-11-14 – 2016-11-15 (×2): 25 ug via VAGINAL
  Filled 2016-11-14 (×3): qty 1

## 2016-11-14 MED ORDER — PENICILLIN G POT IN DEXTROSE 60000 UNIT/ML IV SOLN
3.0000 10*6.[IU] | INTRAVENOUS | Status: DC
  Administered 2016-11-15 (×3): 3 10*6.[IU] via INTRAVENOUS
  Filled 2016-11-14 (×10): qty 50

## 2016-11-14 MED ORDER — TERBUTALINE SULFATE 1 MG/ML IJ SOLN
0.2500 mg | Freq: Once | INTRAMUSCULAR | Status: DC | PRN
  Filled 2016-11-14: qty 1

## 2016-11-14 MED ORDER — FENTANYL CITRATE (PF) 100 MCG/2ML IJ SOLN
100.0000 ug | INTRAMUSCULAR | Status: DC | PRN
  Administered 2016-11-14: 100 ug via INTRAVENOUS
  Filled 2016-11-14: qty 2

## 2016-11-14 MED ORDER — LIDOCAINE HCL (PF) 1 % IJ SOLN: 30.0000 mL | INTRAMUSCULAR | Status: DC | PRN

## 2016-11-14 MED ORDER — OXYTOCIN 40 UNITS IN LACTATED RINGERS INFUSION - SIMPLE MED
2.5000 [IU]/h | INTRAVENOUS | Status: DC
  Filled 2016-11-14: qty 1000

## 2016-11-14 MED ORDER — FENTANYL CITRATE (PF) 100 MCG/2ML IJ SOLN: 50.0000 ug | INTRAMUSCULAR | Status: DC | PRN

## 2016-11-14 MED ORDER — LACTATED RINGERS IV SOLN
INTRAVENOUS | Status: DC
  Administered 2016-11-14: 21:00:00 via INTRAVENOUS

## 2016-11-14 MED ORDER — ONDANSETRON HCL 4 MG/2ML IJ SOLN
4.0000 mg | Freq: Four times a day (QID) | INTRAMUSCULAR | Status: DC | PRN
Start: 2016-11-14 — End: 2016-11-14

## 2016-11-14 MED ORDER — LACTATED RINGERS IV SOLN: INTRAVENOUS | Status: DC

## 2016-11-14 MED ORDER — LACTATED RINGERS IV BOLUS (SEPSIS)
500.0000 mL | Freq: Once | INTRAVENOUS | Status: AC
  Administered 2016-11-14: 500 mL via INTRAVENOUS

## 2016-11-14 MED ORDER — FLEET ENEMA 7-19 GM/118ML RE ENEM: 1.0000 | ENEMA | RECTAL | Status: DC | PRN

## 2016-11-14 NOTE — H&P (Signed)
LABOR AND DELIVERY ADMISSION HISTORY AND PHYSICAL NOTE  Annette Juarez is a 21 y.o. female G1P0 with IUP at 20w1dby 6 week [place] [facility] for eval for non-reactive NST in clinic today.  She had a reactive NST in the [facility].  She is being admitted for IOL due to likely A2GDM.  She has not been seen for prenatal care in almost 2 months prior to today.  Fasting glucose 108 this morning.  States fasting blood sugars typically in the 100s and 2h postprandials 80s-180s.  She reports positive fetal movement. She denies leakage of fluid or vaginal bleeding.  Prenatal History/Complications:  GEXHB7/J Past Medical History: History reviewed. No pertinent past medical history.  Past Surgical History: Past Surgical History:  Procedure Laterality Date  . NO PAST SURGERIES      Obstetrical History: OB History    Gravida Annette Term Preterm AB Living   1         0   SAB TAB Ectopic Multiple Live Births                  Social History: Social History   Social History  . Marital status: Single    Spouse name: N/A  . Number of children: N/A  . Years of education: N/A   Social History Main Topics  . Smoking status: Never Smoker  . Smokeless tobacco: Never Used  . Alcohol use No  . Drug use: No  . Sexual activity: Yes    Birth control/ protection: None   Other Topics Concern  . None   Social History Narrative  . None    Family History: Family History  Problem Relation Age of Onset  . Hypertension Mother   . Diabetes Father   . Asthma Sister   . Diabetes Maternal Grandmother   . Hypertension Maternal Grandmother   . Hypertension Maternal Grandfather     Allergies: No Known Allergies  Prescriptions Prior to Admission  Medication Sig Dispense Refill Last Dose  . Blood Glucose Monitoring Suppl (ACCU-CHEK AVIVA PLUS) w/Device KIT USE   TO CHECK GLUCOSE 4 TIMES DAILY 1 kit 0 11/14/2016 at Unknown time  . Prenatal Multivit-Min-Fe-FA (PRENATAL VITAMINS) 0.8 MG tablet Take 1  tablet by mouth daily. 30 tablet 12 11/13/2016  . [DISCONTINUED] metroNIDAZOLE (FLAGYL) 500 MG tablet Take 1 tablet (500 mg total) by mouth 2 (two) times daily. (Patient not taking: Reported on 09/06/2016) 14 tablet 0 Not Taking  . [DISCONTINUED] promethazine (PHENERGAN) 25 MG tablet Take 1 tablet (25 mg total) by mouth every 6 (six) hours as needed for nausea or vomiting. Take 1/2 to 1 tablet every 6 hours prn nausea (Patient not taking: Reported on 09/06/2016) 30 tablet 1 Not Taking  . [DISCONTINUED] terconazole (TERAZOL 3) 0.8 % vaginal cream Place 1 applicator vaginally at bedtime. (Patient not taking: Reported on 09/06/2016) 20 g 0 Not Taking     Review of Systems   All systems reviewed and negative except as stated in HPI  Blood pressure (!) 132/93, pulse 85, resp. rate 20, height 5' 4" (1.626 m), weight 223 lb (101.2 kg), last menstrual period 02/10/2016, SpO2 100 %, unknown if currently breastfeeding. General appearance: alert, cooperative and appears stated age Lungs: no respiratory distress Heart: regular rate, pulses 2+ Abdomen: soft, non-tender; gravid Extremities: No calf swelling or tenderness Presentation: cephalic on SVE Fetal monitoring: cat I - baseline 135, mod variability, +accels, no decels Uterine activity: uterine irritability, irregular ctx Dilation: 1.5 Effacement (%):  Thick Exam by:: Annette March, RN   Prenatal labs: ABO, Rh: O/Negative/-- (09/13 1411) Antibody: Negative (09/13 1411) Rubella: immune RPR: Non Reactive (09/13 1411)  HBsAg: Negative (09/13 1411)  HIV: Non Reactive (09/13 1411)  GBS:  positive 1 hr Glucola: 145/142/96 - done at 61w3dGenetic screening:  None done Anatomy [place] female, wnl  Prenatal Transfer Tool  Maternal Diabetes: Yes:  Diabetes Type:  Pre-pregnancy vs GDM (diagnosed at 16 weeks) Genetic Screening: Declined Maternal Ultrasounds/Referrals: Normal Fetal Ultrasounds or other Referrals:  None Maternal Substance Abuse:   No Significant Maternal Medications:  None Significant Maternal Lab Results: Lab values include: Group B Strep positive  Results for orders placed or performed during the hospital encounter of 11/14/16 (from the past 24 hour(s))  Group B strep by PCR   Collection Time: 11/14/16  5:46 PM  Result Value Ref Range   Group B strep by PCR POSITIVE (A) NEGATIVE  CBC   Collection Time: 11/14/16  6:00 PM  Result Value Ref Range   WBC 10.4 4.0 - 10.5 K/uL   RBC 3.95 3.87 - 5.11 MIL/uL   Hemoglobin 12.6 12.0 - 15.0 g/dL   HCT 36.6 36.0 - 46.0 %   MCV 92.7 78.0 - 100.0 fL   MCH 31.9 26.0 - 34.0 pg   MCHC 34.4 30.0 - 36.0 g/dL   RDW 13.5 11.5 - 15.5 %   Platelets 380 150 - 400 K/uL  Glucose, capillary   Collection Time: 11/14/16  8:37 PM  Result Value Ref Range   Glucose-Capillary 73 65 - 99 mg/dL  Results for orders placed or performed in visit on 11/14/16 (from the past 24 hour(s))  POCT CBG (Fasting - Glucose)   Collection Time: 11/14/16  4:33 PM  Result Value Ref Range   Glucose Fasting, POC 108 (A) 70 - 99 mg/dL    Patient Active Problem List   Diagnosis Date Noted  . Limited prenatal care in third trimester 11/14/2016  . Gestational diabetes 11/14/2016  . Normal labor 11/14/2016  . GDM (gestational diabetes mellitus) 08/04/2016  . Rh negative, antepartum 05/18/2016  . Susceptible to varicella (non-immune), currently pregnant 05/12/2016  . Supervision of high risk pregnancy, antepartum 05/11/2016  . Obesity affecting pregnancy, antepartum 05/11/2016    Assessment: Annette WEINRICHis a 21y.o. G1P0 at 381w1dere for IOL for GDM A1/B.  #Labor: FB and cytotec placed at 2043 #Pain: Plans for epidural #FWB: Category I #ID:  none #MOF: breast and bottle #MOC:nexplanon #Circ:  Outpatient circ desired #elevated blood pressures:  x2 since admission, [place] #Rh neg:  Rhogam given 09/06/16, repeat postpartum #VZV non-immune:  Vaccinate postpartum  JuLulu Juarez/19/2018,  9:34 PM   OB FELLOW HISTORY AND PHYSICAL ATTESTATION  I have seen and examined this patient; I agree with above documentation in the resident's note.    Annette Juarez OB Fellow 11/14/2016, 11:04 PM

## 2016-11-14 NOTE — Progress Notes (Signed)
   PRENATAL VISIT NOTE  Subjective:  Annette Juarez is a 21 y.o. G1P0 at 6021w1d being seen today for ongoing prenatal care.  She is currently monitored for the following issues for this high-risk pregnancy and has Supervision of high risk pregnancy, antepartum; Obesity affecting pregnancy, antepartum; Susceptible to varicella (non-immune), currently pregnant; Rh negative, antepartum; and GDM (gestational diabetes mellitus) on her problem list.  Patient reports no bleeding, no leaking and occasional contractions.  Contractions: Irregular. Vag. Bleeding: None.  Movement: Present. Denies leaking of fluid.   The following portions of the patient's history were reviewed and updated as appropriate: allergies, current medications, past family history, past medical history, past social history, past surgical history and problem list. Problem list updated.  Objective:   Vitals:   11/14/16 1548  BP: 130/87  Pulse: (!) 105  Temp: 98.1 F (36.7 C)  Weight: 223 lb 12.8 oz (101.5 kg)    Fetal Status: Fetal Heart Rate (bpm): NST;non-reactive   Movement: Present     General:  Alert, oriented and cooperative. Patient is in no acute distress.  Skin: Skin is warm and dry. No rash noted.   Cardiovascular: Normal heart rate noted  Respiratory: Normal respiratory effort, no problems with respiration noted  Abdomen: Soft, gravid, appropriate for gestational age. Pain/Pressure: Present     Pelvic:  Cervical exam deferred        Extremities: Normal range of motion.  Edema: None  Mental Status: Normal mood and affect. Normal behavior. Normal judgment and thought content.   Random CBG; 109 NST: no accels, no decels, moderate variability, Cat. 2 tracing. Occasional contractions on toco.   Assessment and Plan:  Pregnancy: G1P0 at 4221w1d  1. Supervision of high risk pregnancy, antepartum     Has not been in the office since 09/20/16.  Did not bring glucometer log with her: reports verbally some out of range  blood sugars.       [facility] notified of non-reactive NST.  Dr.Constance consulted: recommended management if blood sugars unknown. GBS to be obtained at [facility], due to prolonged time to get back results if needed for delivery from the office.  - Fetal nonstress test; Future - POCT CBG (Fasting - Glucose)     2. Rh negative, antepartum    Rhogam postpartum; last given 09/06/16  3. Obesity affecting pregnancy, antepartum     7lb weight gain this pregnancy  4. Susceptible to varicella (non-immune), currently pregnant     Varicella postpartum  5. Diet controlled gestational diabetes mellitus (GDM) in third trimester     Limited PNC in the 3rd trimester, medical non compliance with appointments.    Term labor symptoms and general obstetric precautions including but not limited to vaginal bleeding, contractions, leaking of fluid and fetal movement were reviewed in detail with the patient. Please refer to After Visit Summary for other counseling recommendations.  Return in about 1 week (around 11/21/2016) [facility].   Roe Coombsachelle A Wiliam Cauthorn, CNM

## 2016-11-14 NOTE — Progress Notes (Signed)
Patient complains of strong contractions about every 15-20 minutes, reports good fetal movement.

## 2016-11-14 NOTE — MAU Note (Signed)
Limited care, had not been seen since Jan.  Gest diabetic.  Sent from office today, non-reactive tracing.

## 2016-11-14 NOTE — MAU Provider Note (Signed)
History     CSN: [id number]  Arrival date and time: 11/14/16 1709   First Provider Initiated Contact with Patient 11/14/16 1745      Chief Complaint  Patient presents with  . Abdominal Pain    NRNST   HPI   Annette Juarez is a 21 y.o. female G1P0 @ 74w1dwith a history of A1GDM, here in MAU for evaluation.  [facility] and was sent here due to a non-reactive NST. Due to medicaid transportation she was late to multiple appointments and had not been to the clinic since January 23 for prenatal care.   She complains of contraction pain. She is feeling contractions every 10 minutes.   OB History    Gravida Para Term Preterm AB Living   1         0   SAB TAB Ectopic Multiple Live Births                  History reviewed. No pertinent past medical history.  Past Surgical History:  Procedure Laterality Date  . NO PAST SURGERIES      Family History  Problem Relation Age of Onset  . Hypertension Mother   . Diabetes Father   . Asthma Sister   . Diabetes Maternal Grandmother   . Hypertension Maternal Grandmother   . Hypertension Maternal Grandfather     Social History  Substance Use Topics  . Smoking status: Never Smoker  . Smokeless tobacco: Never Used  . Alcohol use No    Allergies: No Known Allergies  Prescriptions Prior to Admission  Medication Sig Dispense Refill Last Dose  . Blood Glucose Monitoring Suppl (ACCU-CHEK AVIVA PLUS) w/Device KIT USE   TO CHECK GLUCOSE 4 TIMES DAILY 1 kit 0 11/14/2016 at Unknown time  . Prenatal Multivit-Min-Fe-FA (PRENATAL VITAMINS) 0.8 MG tablet Take 1 tablet by mouth daily. 30 tablet 12 11/13/2016  . promethazine (PHENERGAN) 25 MG tablet Take 25 mg by mouth every 6 (six) hours as needed for nausea or vomiting.   Not Taking  . [DISCONTINUED] metroNIDAZOLE (FLAGYL) 500 MG tablet Take 1 tablet (500 mg total) by mouth 2 (two) times daily. (Patient not taking: Reported on 09/06/2016) 14 tablet 0  Not Taking  . [DISCONTINUED] promethazine (PHENERGAN) 25 MG tablet Take 1 tablet (25 mg total) by mouth every 6 (six) hours as needed for nausea or vomiting. Take 1/2 to 1 tablet every 6 hours prn nausea (Patient not taking: Reported on 09/06/2016) 30 tablet 1 Not Taking  . [DISCONTINUED] terconazole (TERAZOL 3) 0.8 % vaginal cream Place 1 applicator vaginally at bedtime. (Patient not taking: Reported on 09/06/2016) 20 g 0 Not Taking   Results for orders placed or performed during the hospital encounter of 11/14/16 (from the past 48 hour(s))  Group B strep by PCR     Status: Abnormal   Collection Time: 11/14/16  5:46 PM  Result Value Ref Range   Group B strep by PCR POSITIVE (A) NEGATIVE    Comment: CRITICAL RESULT CALLED TO, READ BACK BY AND VERIFIED WITH: MCCLELLAN,J. @ 1904 ON 3.19.18 BY BOSTONC     Review of Systems  Gastrointestinal: Positive for abdominal pain.   Physical Exam   Blood pressure 135/84, pulse (!) 107, resp. rate 18, height 5' 4"  (1.626 m), weight 223 lb (101.2 kg), last menstrual period 02/10/2016, SpO2 100 %, unknown if currently breastfeeding.   Patient Vitals for the past 24 hrs:  BP Pulse Resp SpO2 Height Weight  11/14/16 1731 135/84 (!) 107 18 100 % 5' 4"  (1.626 m) 223 lb (101.2 kg)    Physical Exam  Constitutional: She is oriented to person, place, and time. She appears well-developed and well-nourished. No distress.  HENT:  Head: Normocephalic.  GI: Soft. She exhibits no distension and no mass. There is no tenderness. There is no rebound and no guarding.  Genitourinary:  Genitourinary Comments: Dilation: 1.5 Effacement (%): Thick Cervical Position: Middle Exam by:: Para March, RN  Musculoskeletal: Normal range of motion.  Neurological: She is alert and oriented to person, place, and time.  Skin: Skin is warm. She is not diaphoretic.  Psychiatric: Her behavior is normal.   Fetal Tracing: Baseline: 135 bpm Variability: Moderate  Accelerations:  15x15 Decelerations: none  Toco: occasional contraction   MAU Course  Procedures  None  MDM  GBS collected  Last Growth [place] was at 33 weeks @ 42 % Admitting to BS due to very limited prenatal care due to lack of transportation, non compliance. Fasting glucose 108 today. Likely A2GDM.  NST reactive today in MAU, however non reactive in the office today.    Assessment and Plan   A:  1. Labor and delivery, indication for care   2.      GBS positive    P:  Admit to labor and delivery for induction of labor for A1/A2 GDM PCN  Lezlie Lye, NP 11/14/2016 7:50 PM

## 2016-11-14 NOTE — Progress Notes (Addendum)
G1 @ 39.[redacted] wksga. Presents to triage for NRNST and contractions. Denies LOF or bleeding. +FM. Pt had OB appt today and was sent here for further evaluation of Fetal status.   GDM diet controlled. BS a.m. 98. Office BS 100  EFM applied. VSS. See flow sheet for VS  1750: SVE: 1.5/thick/ballottable  1757: provider at bs discussing poc to admit for induction dt GDM and NRNST.   1800: Labs drawn. IV started with 500 bolus LR. Pt desires epidural  1810: pending L&D room assignment  1818: up to bathroom  1820: Per Rasch, Victorino DikeJennifer, hold off on pt being admitted due to GDM diet controlled.

## 2016-11-15 ENCOUNTER — Inpatient Hospital Stay (HOSPITAL_COMMUNITY): Payer: Medicaid Other | Admitting: Anesthesiology

## 2016-11-15 ENCOUNTER — Encounter (HOSPITAL_COMMUNITY): Payer: Self-pay

## 2016-11-15 DIAGNOSIS — O360930 Maternal care for other rhesus isoimmunization, third trimester, not applicable or unspecified: Secondary | ICD-10-CM

## 2016-11-15 DIAGNOSIS — Z3A39 39 weeks gestation of pregnancy: Secondary | ICD-10-CM

## 2016-11-15 DIAGNOSIS — O24429 Gestational diabetes mellitus in childbirth, unspecified control: Secondary | ICD-10-CM

## 2016-11-15 LAB — ABO/RH: ABO/RH(D): O NEG

## 2016-11-15 LAB — GLUCOSE, CAPILLARY
GLUCOSE-CAPILLARY: 106 mg/dL — AB (ref 65–99)
GLUCOSE-CAPILLARY: 90 mg/dL (ref 65–99)
GLUCOSE-CAPILLARY: 99 mg/dL (ref 65–99)
Glucose-Capillary: 84 mg/dL (ref 65–99)

## 2016-11-15 LAB — HIV ANTIBODY (ROUTINE TESTING W REFLEX): HIV SCREEN 4TH GENERATION: NONREACTIVE

## 2016-11-15 LAB — PROTEIN / CREATININE RATIO, URINE
Creatinine, Urine: 94 mg/dL
Protein Creatinine Ratio: 0.14 mg/mg{Cre} (ref 0.00–0.15)
Total Protein, Urine: 13 mg/dL

## 2016-11-15 LAB — RPR: RPR: NONREACTIVE

## 2016-11-15 MED ORDER — BENZOCAINE-MENTHOL 20-0.5 % EX AERO
1.0000 "application " | INHALATION_SPRAY | CUTANEOUS | Status: DC | PRN
  Administered 2016-11-15: 1 via TOPICAL
  Filled 2016-11-15: qty 56

## 2016-11-15 MED ORDER — ZOLPIDEM TARTRATE 5 MG PO TABS
5.0000 mg | ORAL_TABLET | Freq: Every evening | ORAL | Status: DC | PRN
  Administered 2016-11-15: 5 mg via ORAL
  Filled 2016-11-15: qty 1

## 2016-11-15 MED ORDER — EPHEDRINE 5 MG/ML INJ
10.0000 mg | INTRAVENOUS | Status: DC | PRN
  Filled 2016-11-15: qty 2

## 2016-11-15 MED ORDER — ACETAMINOPHEN 325 MG PO TABS
650.0000 mg | ORAL_TABLET | ORAL | Status: DC | PRN
  Administered 2016-11-15 – 2016-11-17 (×6): 650 mg via ORAL
  Filled 2016-11-15 (×6): qty 2

## 2016-11-15 MED ORDER — LACTATED RINGERS IV SOLN: 500.0000 mL | Freq: Once | INTRAVENOUS | Status: DC

## 2016-11-15 MED ORDER — FENTANYL CITRATE (PF) 100 MCG/2ML IJ SOLN
INTRAMUSCULAR | Status: AC
  Administered 2016-11-15: 100 ug
  Filled 2016-11-15: qty 2

## 2016-11-15 MED ORDER — PHENYLEPHRINE 40 MCG/ML (10ML) SYRINGE FOR IV PUSH (FOR BLOOD PRESSURE SUPPORT)
80.0000 ug | PREFILLED_SYRINGE | INTRAVENOUS | Status: DC | PRN
  Filled 2016-11-15: qty 5

## 2016-11-15 MED ORDER — LIDOCAINE HCL (PF) 1 % IJ SOLN
INTRAMUSCULAR | Status: DC | PRN
  Administered 2016-11-15: 6 mL via EPIDURAL
  Administered 2016-11-15: 4 mL

## 2016-11-15 MED ORDER — ZOLPIDEM TARTRATE 5 MG PO TABS
5.0000 mg | ORAL_TABLET | Freq: Every evening | ORAL | Status: DC | PRN
Start: 2016-11-15 — End: 2016-11-17

## 2016-11-15 MED ORDER — FENTANYL CITRATE (PF) 100 MCG/2ML IJ SOLN: 100.0000 ug | Freq: Once | INTRAMUSCULAR | Status: DC

## 2016-11-15 MED ORDER — SIMETHICONE 80 MG PO CHEW: 80.0000 mg | CHEWABLE_TABLET | ORAL | Status: DC | PRN

## 2016-11-15 MED ORDER — FENTANYL 2.5 MCG/ML BUPIVACAINE 1/10 % EPIDURAL INFUSION (WH - ANES)
INTRAMUSCULAR | Status: AC
  Filled 2016-11-15: qty 100

## 2016-11-15 MED ORDER — DIBUCAINE 1 % RE OINT: 1.0000 "application " | TOPICAL_OINTMENT | RECTAL | Status: DC | PRN

## 2016-11-15 MED ORDER — TETANUS-DIPHTH-ACELL PERTUSSIS 5-2.5-18.5 LF-MCG/0.5 IM SUSP
0.5000 mL | Freq: Once | INTRAMUSCULAR | Status: AC
  Administered 2016-11-17: 0.5 mL via INTRAMUSCULAR
  Filled 2016-11-15: qty 0.5

## 2016-11-15 MED ORDER — OXYTOCIN 40 UNITS IN LACTATED RINGERS INFUSION - SIMPLE MED
1.0000 m[IU]/min | INTRAVENOUS | Status: DC
  Administered 2016-11-15: 2 m[IU]/min via INTRAVENOUS

## 2016-11-15 MED ORDER — FENTANYL 2.5 MCG/ML BUPIVACAINE 1/10 % EPIDURAL INFUSION (WH - ANES)
14.0000 mL/h | INTRAMUSCULAR | Status: DC | PRN
  Administered 2016-11-15 (×2): 14 mL/h via EPIDURAL
  Filled 2016-11-15: qty 100

## 2016-11-15 MED ORDER — BUPIVACAINE HCL (PF) 0.25 % IJ SOLN
INTRAMUSCULAR | Status: DC | PRN
  Administered 2016-11-15: 8 mL via EPIDURAL

## 2016-11-15 MED ORDER — IBUPROFEN 600 MG PO TABS
600.0000 mg | ORAL_TABLET | Freq: Four times a day (QID) | ORAL | Status: DC
  Administered 2016-11-15 – 2016-11-17 (×7): 600 mg via ORAL
  Filled 2016-11-15 (×9): qty 1

## 2016-11-15 MED ORDER — DIPHENHYDRAMINE HCL 50 MG/ML IJ SOLN: 12.5000 mg | INTRAMUSCULAR | Status: DC | PRN

## 2016-11-15 MED ORDER — PHENYLEPHRINE 40 MCG/ML (10ML) SYRINGE FOR IV PUSH (FOR BLOOD PRESSURE SUPPORT)
PREFILLED_SYRINGE | INTRAVENOUS | Status: AC
  Filled 2016-11-15: qty 20

## 2016-11-15 MED ORDER — COCONUT OIL OIL: 1.0000 "application " | TOPICAL_OIL | Status: DC | PRN

## 2016-11-15 MED ORDER — ONDANSETRON HCL 4 MG PO TABS: 4.0000 mg | ORAL_TABLET | ORAL | Status: DC | PRN

## 2016-11-15 MED ORDER — DIPHENHYDRAMINE HCL 25 MG PO CAPS: 25.0000 mg | ORAL_CAPSULE | Freq: Four times a day (QID) | ORAL | Status: DC | PRN

## 2016-11-15 MED ORDER — WITCH HAZEL-GLYCERIN EX PADS
1.0000 "application " | MEDICATED_PAD | CUTANEOUS | Status: DC | PRN
  Administered 2016-11-16: 1 via TOPICAL

## 2016-11-15 MED ORDER — PRENATAL MULTIVITAMIN CH
1.0000 | ORAL_TABLET | Freq: Every day | ORAL | Status: DC
  Administered 2016-11-16 – 2016-11-17 (×2): 1 via ORAL
  Filled 2016-11-15 (×2): qty 1

## 2016-11-15 MED ORDER — ONDANSETRON HCL 4 MG/2ML IJ SOLN: 4.0000 mg | INTRAMUSCULAR | Status: DC | PRN

## 2016-11-15 MED ORDER — SENNOSIDES-DOCUSATE SODIUM 8.6-50 MG PO TABS
2.0000 | ORAL_TABLET | ORAL | Status: DC
  Administered 2016-11-15 – 2016-11-16 (×2): 2 via ORAL
  Filled 2016-11-15 (×2): qty 2

## 2016-11-15 MED ORDER — PHENYLEPHRINE 40 MCG/ML (10ML) SYRINGE FOR IV PUSH (FOR BLOOD PRESSURE SUPPORT)
80.0000 ug | PREFILLED_SYRINGE | INTRAVENOUS | Status: DC | PRN
  Filled 2016-11-15: qty 10
  Filled 2016-11-15: qty 5

## 2016-11-15 NOTE — Progress Notes (Signed)
LABOR PROGRESS NOTE  Subjective: Foley bulb still present; ambien sleep  Objective: BP 134/75   Pulse 89   Temp 98.5 F (36.9 C) (Oral)   Resp 18   Ht 5\' 4"  (1.626 m)   Wt 223 lb (101.2 kg)   LMP 02/10/2016 Comment: states period is abnormal  SpO2 100%   BMI 38.28 kg/m  or   Dilation: 2 Effacement (%): Thick Cervical Position: Middle Exam by:: Rolene Arbouresmaine Lewis, RN  Assessment / Plan: 21 y.o. G1P0 at 5028w2d here for IOL for GDM A2/B  Labor: FB still in place, cytotec x2 so far Fetal Wellbeing:  Category I - baseline 120, moderate variability, + accel, no decel Pain Control:  Stadol prn for now Anticipated MOD:  Vaginal Gestational htn:  No severe range pressures, PIH labs wnl  Charlsie MerlesJulia Rhoden, MD 11/15/2016, 2:03 AM

## 2016-11-15 NOTE — Progress Notes (Signed)
Annette Juarez is a 21 y.o. G1P0 at 2271w2d    Subjective: Pain is well controlled with epidural.  Does feel some pressure during contractions.  Also has some nausea and vomiting.  Otherwise feeling ok.  Objective: BP (!) 134/92   Pulse (!) 105   Temp 98.8 F (37.1 C) (Oral)   Resp 18   Ht 5\' 4"  (1.626 m)   Wt 223 lb (101.2 kg)   LMP 02/10/2016 Comment: states period is abnormal  SpO2 99%   BMI 38.28 kg/m  I/O last 3 completed shifts: In: 341.7 [I.V.:91.7; IV Piggyback:250] Out: 850 [Urine:450; Emesis/NG output:400] Total I/O In: -  Out: 450 [Urine:450]  FHT:  FHR: 145 bpm, variability: moderate,  accelerations:  Present,  decelerations:  Absent UC:   regular, every 4 minutes SVE:   Dilation: Lip/rim Effacement (%): 100 Station: +1, +2 Exam by:: sherry Aurélie.CurrierGrindstaff RN  Labs: Lab Results  Component Value Date   WBC 10.4 11/14/2016   HGB 12.6 11/14/2016   HCT 36.6 11/14/2016   MCV 92.7 11/14/2016   PLT 380 11/14/2016    Assessment / Plan: Induction of labor due to gestational hypertension and gestational diabetes,  progressing well on pitocin  Labor: Progressing normally Preeclampsia:  none Fetal Wellbeing:  Category I Pain Control:  Epidural I/D:  [facility] Anticipated MOD:  NSVD  Annette AbbotNimeka Nolton Juarez 11/15/2016, 11:28 AM

## 2016-11-15 NOTE — Progress Notes (Signed)
Annette Juarez is a 21 y.o. G1P0 at 7566w2d by ultrasound admitted for induction of labor due to Gestational diabetes.  Subjective: Comfortable after epidural. No complaints at this time.   Objective: BP (!) 116/53   Pulse 79   Temp 98.5 F (36.9 C) (Oral)   Resp 20   Ht 5\' 4"  (1.626 m)   Wt 101.2 kg (223 lb)   LMP 02/10/2016 Comment: states period is abnormal  SpO2 100%   BMI 38.28 kg/m  No intake/output data recorded. Total I/O In: 341.7 [I.V.:91.7; IV Piggyback:250] Out: 400 [Emesis/NG output:400]  FHT:  FHR: 135 bpm, variability: absent,  accelerations:  Present,  decelerations:  Absent UC:   regular, every 2-4 minutes SVE:   Dilation: 8 Effacement (%): 90 Station: 0 Exam by:: Desmaine RN  Labs: Lab Results  Component Value Date   WBC 10.4 11/14/2016   HGB 12.6 11/14/2016   HCT 36.6 11/14/2016   MCV 92.7 11/14/2016   PLT 380 11/14/2016    Assessment / Plan: IOL due to GDM. Progressing well.   Labor: Progressing normally. Foley out. SROM @230   Preeclampsia:  none Fetal Wellbeing:  Category I Pain Control:  Epidural I/D:  n/a Anticipated MOD:  NSVD  Melburn PopperJames Kirby 11/15/2016, 3:10 AM  OB FELLOW MEDICAL STUDENT NOTE ATTESTATION  I have seen and examined this patient. Note this is a Psychologist, occupationalmedical student note and as such does not necessarily reflect the patient's plan of care. Please see progress note for this date of service.    Jen MowElizabeth Jaedan Huttner, DO OB Fellow 11/15/2016, 9:38 AM

## 2016-11-15 NOTE — Anesthesia Pain Management Evaluation Note (Signed)
  CRNA Pain Management Visit Note  Patient: Annette Juarez, 21 y.o., female  "Hello [facility] am a [facility] the [facility]. We have an anesthesia team available at all times to provide care throughout the hospital, including epidural management and anesthesia for C-section. I don't know your plan for the delivery whether it a natural birth, water birth, IV sedation, nitrous supplementation, doula or epidural, but we want to meet your pain goals."   1.Was your pain managed to your expectations on prior hospitalizations?   No prior hospitalizations  2.What is your expectation for pain management during this hospitalization?     Epidural  3.How can we help you reach that goal? Epidural intact  Record the patient's initial score and the patient's pain goal.   Pain: 4  Pain Goal: 10 [facility] wants you to be able to say your pain was always managed very well.  Edison PaceWILKERSON,Linford Quintela 11/15/2016

## 2016-11-15 NOTE — Progress Notes (Signed)
LABOR PROGRESS NOTE  Subjective: More comfortable with epidural.  Feeling rectal pressure with contractions.  Objective: BP (!) 152/71   Pulse 94   Temp 98.5 F (36.9 C) (Oral)   Resp 18   Ht 5\' 4"  (1.626 m)   Wt 223 lb (101.2 kg)   LMP 02/10/2016 Comment: states period is abnormal  SpO2 99%   BMI 38.28 kg/m    Dilation: 6 Effacement (%): 80 Cervical Position: Middle Station: 0 Presentation: Vertex Exam by:: Charlsie MerlesJulia Neftali Abair, MD  Assessment / Plan: 21 y.o. G1P0 at [redacted]w[redacted]d here for IOL for A1/2GDM vs type 2 DM (type B)  Labor: s/p SROM and FB out.  Progressing. Start pitocin 2x2; placed IUPC Fetal Wellbeing:  Category I - baseline 135, moderate variability, +accels, no decels.  FSE placed for patient comfort Pain Control:  epidural Anticipated MOD:  Vaginal GBS+:  Adequately treated, cont pcn q4 Gestational htn:  1 diastolic of 111, otherwise no severe range pressures, last 152/71.  Cont to monitor. PIH labs neg.   Charlsie MerlesJulia Nahiara Kretzschmar, MD 11/15/2016, 4:38 AM

## 2016-11-15 NOTE — Progress Notes (Signed)
Annette Juarez is a 21 y.o. G1P0 at 652w2d   Subjective: Patient says she can feel pressure during contractions. Pain well controlled on epidural. No new complaints.  Objective: BP 133/69   Pulse 98   Temp 98.7 F (37.1 C) (Oral)   Resp 18   Ht 5\' 4"  (1.626 m)   Wt 223 lb (101.2 kg)   LMP 02/10/2016 Comment: states period is abnormal  SpO2 99%   BMI 38.28 kg/m  I/O last 3 completed shifts: In: 341.7 [I.V.:91.7; IV Piggyback:250] Out: 850 [Urine:450; Emesis/NG output:400] No intake/output data recorded.  FHT:  FHR: 130 bpm, variability: moderate,  accelerations:  Present,  decelerations:  Absent UC:   regular, every 3 minutes SVE:   Dilation: 7 Effacement (%): 80 Station: 0 Exam by:: Mumaw, DO  Labs: Lab Results  Component Value Date   WBC 10.4 11/14/2016   HGB 12.6 11/14/2016   HCT 36.6 11/14/2016   MCV 92.7 11/14/2016   PLT 380 11/14/2016    Assessment / Plan: 21 y.o. G1P0 at 802w2d here for IOL for A1/2GDM vs type 2 DM (type B). Given pitocin break due to late decelerations, resolved now restarted pitocin since 06:15.  Labor: Progressing normally Preeclampsia:  None Fetal Wellbeing:  Category I Pain Control:  Epidural I/D:  GBS positive on PCN Anticipated MOD:  NSVD  Wendee Beaversavid J McMullen, DO, PGY-1 11/15/2016, 7:50 AM

## 2016-11-15 NOTE — Lactation Note (Signed)
This note was copied from a baby's chart. Lactation Consultation Note  Patient Name: Annette Juarez FAOZH'YToday's Date: 11/15/2016 Reason for consult: Initial assessment   Baby 5 hours.  P1.  Mother can easily hand express good flow of colostrum. Spoon fed baby approx 3 ml of colostrum and attempted latching in cradle and football hold. Mother has large diameter nipples.  Baby latched briefly and sucked a few times. Mom encouraged to feed baby 8-12 times/24 hours and with feeding cues.  Encouraged mother to express often and give back to baby until he is latching. Mom made aware of O/P services, breastfeeding support groups, community resources, and our phone # for post-discharge questions.     Maternal Data    Feeding Feeding Type: Breast Milk  LATCH Score/Interventions Latch: Too sleepy or reluctant, no latch achieved, no sucking elicited.  Audible Swallowing: None Intervention(s): Skin to skin;Hand expression  Type of Nipple: Everted at rest and after stimulation  Comfort (Breast/Nipple): Soft / non-tender     Hold (Positioning): Assistance needed to correctly position infant at breast and maintain latch.  LATCH Score: 5  Lactation Tools Discussed/Used     Consult Status Consult Status: Follow-up Date: 11/16/16 Follow-up type: In-patient    Annette Juarez, Annette Juarez 11/15/2016, 8:04 PM

## 2016-11-15 NOTE — Anesthesia Procedure Notes (Signed)
Epidural Patient location during procedure: OB  Staffing Anesthesiologist: Cailyn Houdek  Preanesthetic Checklist Completed: patient identified, site marked, surgical consent, pre-op evaluation, timeout performed, IV checked, risks and benefits discussed and monitors and equipment checked  Epidural Patient position: sitting Prep: site prepped and draped and DuraPrep Patient monitoring: continuous pulse ox and blood pressure Approach: midline Location: L3-L4 Injection technique: LOR air  Needle:  Needle type: Tuohy  Needle gauge: 17 G Needle length: 9 cm and 9 Needle insertion depth: 7 cm Catheter type: closed end flexible Catheter size: 19 Gauge Catheter at skin depth: 14 cm Test dose: negative  Assessment Events: blood not aspirated, injection not painful, no injection resistance, negative IV test and no paresthesia  Additional Notes Dosing of Epidural:  1st dose, through catheter .............................................  Xylocaine 40 mg  2nd dose, through catheter, after waiting 3 minutes.........Xylocaine 60 mg    As each dose occurred, patient was free of IV sx; and patient exhibited no evidence of SA injection.  Patient is more comfortable after epidural dosed. Please see RN's note for documentation of vital signs,and FHR which are stable.  Patient reminded not to try to ambulate with numb legs, and that an RN must be present when she attempts to get up.        

## 2016-11-15 NOTE — Anesthesia Preprocedure Evaluation (Addendum)
Anesthesia Evaluation  Patient identified by MRN, date of birth, ID band Patient awake    Reviewed: Allergy & Precautions, H&P , Patient's Chart, lab work & pertinent test results  Airway Mallampati: II  TM Distance: >3 FB Neck ROM: full    Dental  (+) Teeth Intact   Pulmonary    breath sounds clear to auscultation       Cardiovascular  Rhythm:regular Rate:Normal     Neuro/Psych    GI/Hepatic   Endo/Other  diabetes  Renal/GU      Musculoskeletal   Abdominal   Peds  Hematology   Anesthesia Other Findings       Reproductive/Obstetrics (+) Pregnancy                            Anesthesia Physical Anesthesia Plan  ASA: III  Anesthesia Plan: Epidural   Post-op Pain Management:    Induction:   Airway Management Planned:   Additional Equipment:   Intra-op Plan:   Post-operative Plan:   Informed Consent: I have reviewed the patients History and Physical, chart, labs and discussed the procedure including the risks, benefits and alternatives for the proposed anesthesia with the patient or authorized representative who has indicated his/her understanding and acceptance.   Dental Advisory Given  Plan Discussed with:   Anesthesia Plan Comments: (Labs checked- platelets confirmed with RN in room. Fetal heart tracing, per RN, reported to be stable enough for sitting procedure. Discussed epidural, and patient consents to the procedure:  included risk of possible headache,backache, failed block, allergic reaction, and nerve injury. This patient was asked if she had any questions or concerns before the procedure started.)        Anesthesia Quick Evaluation

## 2016-11-16 LAB — HEPATITIS C ANTIBODY: HCV Ab: <0.1 s/co ratio (ref 0.0–0.9)

## 2016-11-16 MED ORDER — RHO D IMMUNE GLOBULIN 1500 UNIT/2ML IJ SOSY
300.0000 ug | PREFILLED_SYRINGE | Freq: Once | INTRAMUSCULAR | Status: AC
  Administered 2016-11-16: 300 ug via INTRAVENOUS
  Filled 2016-11-16: qty 2

## 2016-11-16 MED ORDER — AMLODIPINE BESYLATE 5 MG PO TABS
5.0000 mg | ORAL_TABLET | Freq: Every day | ORAL | Status: DC
  Administered 2016-11-16 – 2016-11-17 (×2): 5 mg via ORAL
  Filled 2016-11-16 (×3): qty 1

## 2016-11-16 NOTE — Lactation Note (Signed)
This note was copied from a baby's chart. Lactation Consultation Note  Patient Name: Annette Juarez JXBJY'NToday's Date: 11/16/2016 Reason for consult: Follow-up assessment Baby at 31 hr of life. Upon entry mom was trying to latch baby. She had baby on his back on the bed in front of her. Mom has large breast with a short shaft wide nipple. Baby has a small mouth and is doing some tongue thrusting. Discussed suck training. Mom stated she would like to latch baby but her long term plan is to pump to feed. Mom can manually express large drops of colostrum. She reports the pump is working well, but is pinching. Changes the flange size to #27 and instructed mom to turn the suction down. Encouraged her to bf on demand and post pump, the offer her milk per volume guidelines. Parents are aware of lactation services and support group.    Maternal Data    Feeding Feeding Type: Breast Fed Length of feed: 15 min  LATCH Score/Interventions Latch: Repeated attempts needed to sustain latch, nipple held in mouth throughout feeding, stimulation needed to elicit sucking reflex. Intervention(s): Skin to skin Intervention(s): Assist with latch  Audible Swallowing: A few with stimulation Intervention(s): Hand expression Intervention(s): Alternate breast massage  Type of Nipple: Everted at rest and after stimulation  Comfort (Breast/Nipple): Soft / non-tender     Hold (Positioning): Full assist, staff holds infant at breast Intervention(s): Support Pillows;Position options  LATCH Score: 6  Lactation Tools Discussed/Used     Consult Status Consult Status: Follow-up Date: 11/17/16 Follow-up type: In-patient    Rulon Eisenmengerlizabeth E Armistead Sult 11/16/2016, 9:20 PM

## 2016-11-16 NOTE — Plan of Care (Signed)
Problem: Nutritional: Goal: Mothers verbalization of comfort with breastfeeding process will improve Outcome: Progressing Discussed latching with Pt.  Discussed waking techniques and how to get baby to open mouth wide.  On suck evaluation, RN noted baby only bites.  Pt reports that she has been hand expressing and spoon feeding baby.  Baby was able to open mouth wide enough to take in Pt's whole nipple but did not get anything more than the nipple.  Baby sucked a few times, no swallows heard but breast milk easily hand expressed and put in baby's mouth.

## 2016-11-16 NOTE — Progress Notes (Signed)
Post Partum Day #1 Subjective: no complaints, up ad lib, voiding and tolerating PO  Objective: Blood pressure (!) 143/83, pulse 91, temperature 98.5 F (36.9 C), temperature source Oral, resp. rate 18, height 5\' 4"  (1.626 m), weight 223 lb (101.2 kg), last menstrual period 02/10/2016, SpO2 99 %, unknown if currently breastfeeding.  Physical Exam:  General: cooperative, fatigued, no distress and moderately obese Lochia: appropriate Uterine Fundus: firm Incision: 1st deg lac, healing DVT Evaluation: No evidence of DVT seen on physical exam. No cords or calf tenderness. No significant calf/ankle edema.   Recent Labs  11/14/16 1800  HGB 12.6  HCT 36.6    Assessment/Plan: Plan for discharge tomorrow, Social Work consult and Contraception Nexplanon planned   LOS: 2 days   Roe CoombsRachelle A Mahlon Gabrielle, CNM 11/16/2016, 7:19 AM

## 2016-11-16 NOTE — Anesthesia Postprocedure Evaluation (Signed)
Anesthesia Post Note  Patient: Annette Juarez  Procedure(s) Performed: * No procedures listed *  Patient location during evaluation: Mother Baby Anesthesia Type: Epidural Level of consciousness: awake and alert and oriented Pain management: pain level controlled Vital Signs Assessment: post-procedure vital signs reviewed and stable Respiratory status: spontaneous breathing and nonlabored ventilation Cardiovascular status: stable Postop Assessment: no headache, patient able to bend at knees, no backache, no signs of nausea or vomiting, epidural receding and adequate PO intake Anesthetic complications: no        Last Vitals:  Vitals:   11/16/16 0700 11/16/16 0904  BP: 125/80 138/82  Pulse: 86 97  Resp: 18   Temp: 36.8 C     Last Pain:  Vitals:   11/16/16 0720  TempSrc:   PainSc: Asleep   Pain Goal: Patients Stated Pain Goal: 0 (11/15/16 0015)               Laban EmperorMalinova,Sarahi Borland Hristova

## 2016-11-16 NOTE — Plan of Care (Signed)
Problem: Pain Management: Goal: General experience of comfort will improve and pain level will decrease Outcome: Progressing Pt reports 10/10 perineal pain.  Pt sitting up in bed talking with guest in room at time of assessment.  Pt reports that she has been taking tylenol and ibuprofen and using dermoplast spray.  She reports they only work for a little bit and the pain returns.   Pt given tylenol and Dr. Abelardo DieselMcMullen informed.  No new orders at this time.

## 2016-11-17 LAB — RH IG WORKUP (INCLUDES ABO/RH)
ABO/RH(D): O NEG
FETAL SCREEN: NEGATIVE
GESTATIONAL AGE(WKS): 39
UNIT DIVISION: 0

## 2016-11-17 MED ORDER — AMLODIPINE BESYLATE 5 MG PO TABS: 5.0000 mg | ORAL_TABLET | Freq: Every day | ORAL | 5 refills | Status: DC

## 2016-11-17 MED ORDER — IBUPROFEN 600 MG PO TABS: 600.0000 mg | ORAL_TABLET | Freq: Four times a day (QID) | ORAL | 2 refills | Status: DC

## 2016-11-17 NOTE — Discharge Summary (Signed)
OB Discharge Summary     Patient Name: Annette Juarez DOB: 17-Sep-1995 MRN: [id number]  Date of admission: 11/14/2016 Delivering MD: Aura Camps   Date of discharge: 11/17/2016  Admitting diagnosis: 27.1WKS NRNST Intrauterine pregnancy: [redacted]w[redacted]d    Secondary diagnosis:  Active Problems:   Gestational diabetes   Normal labor  Additional problems: Limited PNC 3rd trimester     Discharge diagnosis: Term Pregnancy Delivered and GDM, medical non-compliance                                                                                                Post partum procedures:none  Augmentation: Pitocin, Cytotec and Foley Balloon  Complications: None  Hospital course:  Induction of Labor With Vaginal Delivery   21y.o. yo G1P0 at 324w4das admitted to the hospital 11/14/2016 for induction of labor.  Indication for induction: Gestational hypertension and GDM.  Patient had an uncomplicated labor course as follows: Membrane Rupture Time/Date: 2:26 AM ,11/15/2016   Intrapartum Procedures: Episiotomy: None [1]                                         Lacerations:  1st degree [2]  Patient had delivery of a Viable infant.  Information for the patient's newborn:  JoAmana, Bouska0[[id number]]Delivery Method: Vaginal, Spontaneous Delivery (Filed from Delivery Summary)   11/15/2016  Details of delivery can be found in separate delivery note.  Patient had a routine postpartum course. Patient is discharged home 11/17/16.  Physical exam  Vitals:   11/16/16 0700 11/16/16 0904 11/16/16 1755 11/17/16 0549  BP: 125/80 138/82 (!) 147/84 127/73  Pulse: 86 97 91 90  Resp: _0 Temp: 98.2 F (36.8 C)  98.2 F (36.8 C) 98.5 F (36.9 C)  TempSrc: Oral  Oral   SpO2:      Weight:      Height:       General: alert, cooperative and no distress Lochia: appropriate Uterine Fundus: firm Incision: N/A DVT Evaluation: No evidence of DVT seen on physical exam. No cords or calf  tenderness. No significant calf/ankle edema. Labs: Lab Results  Component Value Date   WBC 10.4 11/14/2016   HGB 12.6 11/14/2016   HCT 36.6 11/14/2016   MCV 92.7 11/14/2016   PLT 380 11/14/2016   CMP Latest Ref Rng & Units 11/14/2016  Glucose 65 - 99 mg/dL 86  BUN 6 - 20 mg/dL <5(L)  Creatinine 0.44 - 1.00 mg/dL 0.37(L)  Sodium 135 - 145 mmol/L 135  Potassium 3.5 - 5.1 mmol/L 4.2  Chloride 101 - 111 mmol/L 105  CO2 22 - 32 mmol/L 26  Calcium 8.9 - 10.3 mg/dL 8.6(L)  Total Protein 6.5 - 8.1 g/dL 6.3(L)  Total Bilirubin 0.3 - 1.2 mg/dL 0.5  Alkaline Phos 38 - 126 U/L 121  AST 15 - 41 U/L 14(L)  ALT 14 - 54 U/L 8(L)    Discharge instruction: per After Visit Summary and "Baby and Me  Booklet".  After visit meds:  Allergies as of 11/17/2016   No Known Allergies     Medication List    STOP taking these medications   ACCU-CHEK AVIVA PLUS w/Device Kit     TAKE these medications   amLODipine 5 MG tablet Commonly known as:  NORVASC Take 1 tablet (5 mg total) by mouth daily.   ibuprofen 600 MG tablet Commonly known as:  ADVIL,MOTRIN Take 1 tablet (600 mg total) by mouth every 6 (six) hours.   Prenatal Vitamins 0.8 MG tablet Take 1 tablet by mouth daily.       Diet: routine diet  Activity: Advance as tolerated. Pelvic rest for 6 weeks.   Outpatient follow up:4 weeks Follow up Appt:No future appointments. Follow up Visit:No Follow-up on file.  Postpartum contraception: Nexplanon  Newborn Data: Live born female  Birth Weight: 6 lb 9.6 oz (2995 g) APGAR: 8, 9  Baby Feeding: Bottle and Breast Disposition:home with mother   11/17/2016 Morene Crocker, CNM

## 2016-11-17 NOTE — Progress Notes (Signed)
CSW acknowledges consult and completed clinical assessment.  Clinical documentation will follow.  There are no barriers to d/c.  Armond Cuthrell Boyd-Gilyard, MSW, LCSW Clinical Social Work [phone]   

## 2016-11-17 NOTE — Progress Notes (Signed)
Post Partum Day 32 Subjective: no complaints, up ad lib, voiding, tolerating PO and working on breast feeding, encouragement given.  Objective: Blood pressure 127/73, pulse 90, temperature 98.5 F (36.9 C), resp. rate 18, height 5\' 4"  (1.626 m), weight 223 lb (101.2 kg), last menstrual period 02/10/2016, SpO2 99 %, unknown if currently breastfeeding.  Physical Exam:  General: alert, cooperative and no distress Lochia: appropriate Uterine Fundus: firm Incision: none DVT Evaluation: No evidence of DVT seen on physical exam. No cords or calf tenderness. No significant calf/ankle edema.   Recent Labs  11/14/16 1800  HGB 12.6  HCT 36.6    Assessment/Plan: Discharge home, Breastfeeding, Lactation consult, Social Work consult and Contraception Nexplanon planned   LOS: 3 days   Roe CoombsRachelle A Jezabel Juarez, CNM 11/17/2016, 7:17 AM

## 2016-11-17 NOTE — Plan of Care (Signed)
Problem: Nutritional: Goal: Mothers verbalization of comfort with breastfeeding process will improve Outcome: Completed/Met Date Met: 11/17/16 Pt began supplementing overnight with Alimentum.  She reports her intention is to pump and bottle breastmilk and to supplement with formula as needed for baby.  Discussed engorgement and frequency of pumping.  Pt verbalized understanding.

## 2016-11-17 NOTE — Lactation Note (Signed)
This note was copied from a baby's chart. Lactation Consultation Note  Patient Name: Annette Juarez [id number]'s Date: 11/17/2016   Discharge day, baby 6443 hrs old.  Baby continues having trouble latching deep enough, due to Mom's large nipples.  Declined latch assist today. Mom pumping and bottle feeding formula and EBM.   Baby feeding 20 ml formula.  Mom encouraged to do breast massage and hand expression along with regular >8 times per 24 hrs of DEBP.  Does not have pump at home.  Offered WIC loaner pump, Mom declined due to $30 deposit.  Offered OP lactation appointment, Mom declined as she exclusively wants to pump and bottle feed. [facility].  Mom says she can pick up pump today.   Demonstrated how to use pump parts as a manual and a manual double pump.   Volume parameters to be given to Mom by RN.  Baby to increase volume of formula+/EBM to 30-60 ml after 48 hrs.   Encouraged continued STS.  To call Lactation prn as needed.    Annette Juarez, Annette Juarez 11/17/2016, 9:06 AM

## 2016-11-18 ENCOUNTER — Other Ambulatory Visit: Payer: Self-pay | Admitting: Certified Nurse Midwife

## 2016-11-18 ENCOUNTER — Telehealth: Payer: Self-pay | Admitting: *Deleted

## 2016-11-18 DIAGNOSIS — K641 Second degree hemorrhoids: Secondary | ICD-10-CM

## 2016-11-18 MED ORDER — HYDROCORTISONE ACETATE 25 MG RE SUPP: 25.0000 mg | Freq: Two times a day (BID) | RECTAL | 0 refills | Status: DC

## 2016-11-18 NOTE — Telephone Encounter (Signed)
Patient is calling regarding her hemorrhoids she is requesting medication. Reviewed use of stool softeners,OTC products, increased fluids, sitz bath, and if symptoms do not improve over weekend to go to ED. Patient is aware that her insurance does not cover prescribed medication- but she wants it anyway.

## 2016-11-18 NOTE — Clinical Social Work Maternal (Signed)
  CLINICAL SOCIAL WORK MATERNAL/CHILD NOTE  Patient Details  Name: KINDELL STRADA MRN: [id number] Date of Birth: [birth year]  Date:  11/18/2016  Clinical Social Worker Initiating Note:  Laurey Arrow Date/ Time Initiated:  11/17/16/1231     Child's Name:  Buckner Malta.    Legal Guardian:  Mother (FOB is Mickey Farber Sr. )   Need for Interpreter:  None   Date of Referral:  11/16/16     Reason for Referral:  Late or No Prenatal Care    Referral Source:  Central Nursery   Address:  [address] [place] [address]  Phone number:  [phone]   Household Members:  Self, Parents, Siblings   Natural Supports (not living in the home):  Spouse/significant other (FOB's mother will be supportive. )   Professional Supports: None   Employment: Unemployed   Type of Work:     Education:  9 to 11 years   Museum/gallery curator Resources:  [employer]   Other Resources:  [employer]   Cultural/Religious Considerations Which May Impact Care:  Per 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  When CSW arrived, MOB and FOB were sitting on the couch and the infant as asleep in the bassinet. MOB gave CSW permission to meet with MOB while FOB is present. CSW inquired about MOB's supports and MOB reported that MOB will be supported by MOB's mother and siblings.  MOB also state the FOB's mother will be able to assist as needed. CSW asked about MOB's limited PNC and MOB reported that MOB did not have reliable transportation.  However, MOB reported that MOB has established transportation with [employer] and does not foresee any  additional transportation problems or concerns.  CSW denied any barriers with infant's up-coming follow-up appointments.  MOB was encouraged to call and rescheduled an appointment if a need arise.    MOB became irritated with FOB and resulted to utilizing foul language.  CSW intervened and educated the parent's about the effects that DV and escalated verbal confrontation have on children.  Both parent's were understanding and MOB expressed that FOB was irritating MOB.  FOB apologized for irritating MOB and communicated "I was just trying to help".  CSW educated MOB about PPD. CSW informed MOB of possible supports and interventions to decrease PPD.  CSW also encouraged MOB to seek medical attention if needed for increased signs and symptoms for PPD.  MOB denied HI,SI, and DV.  CSW Plan/Description:  Information/Referral to [employer] , [employer] , No Further Intervention Required/No Barriers to Discharge   Laurey Arrow, MSW, LCSW Clinical Social Work [phone]    Dimple Nanas, LCSW 11/18/2016, 1:36 PM

## 2016-11-18 NOTE — Telephone Encounter (Signed)
Anusol HC generic suppositories were sent to the pharmacy for her to try, I am not sure how expensive they will be.  I hope not too bad.  Thanks.  R.Noele Icenhour CNM

## 2016-11-21 ENCOUNTER — Encounter: Payer: Medicaid Other | Admitting: Obstetrics and Gynecology

## 2016-12-20 ENCOUNTER — Ambulatory Visit: Payer: Medicaid Other | Admitting: Certified Nurse Midwife

## 2017-02-18 IMAGING — US US MFM OB COMP +14 WKS
1 series · 14 of 28 positions shown · non-contrast
Comparison: none

[Series 1: us mfm ob comp +14 wks · 14 of 90 slices shown]
[im 4/90]
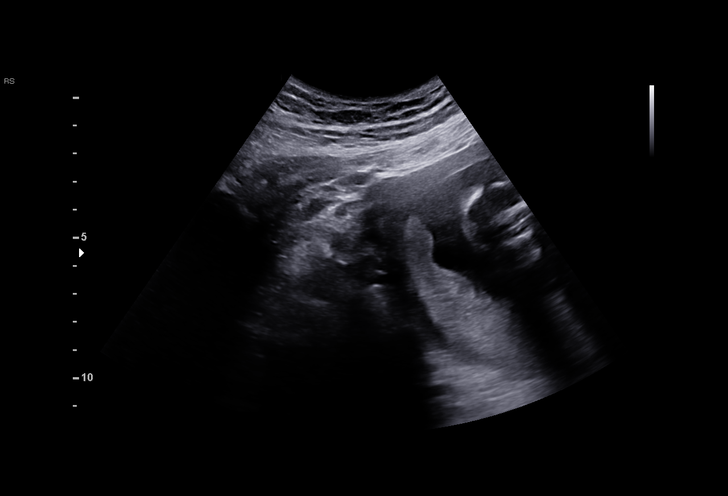
[im 10/90]
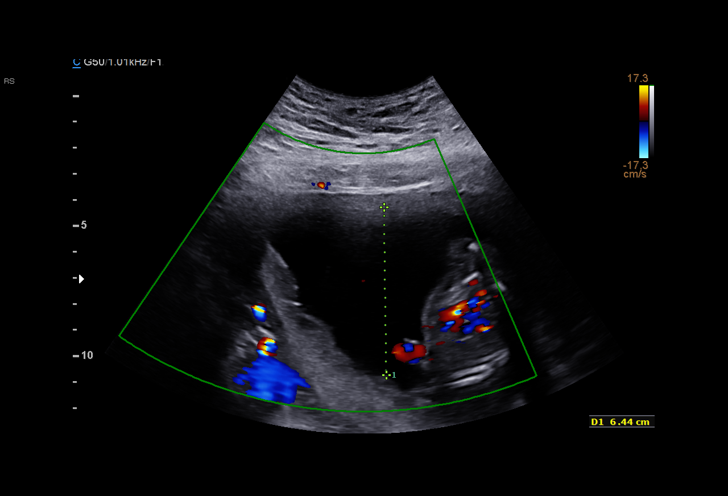
[im 17/90]
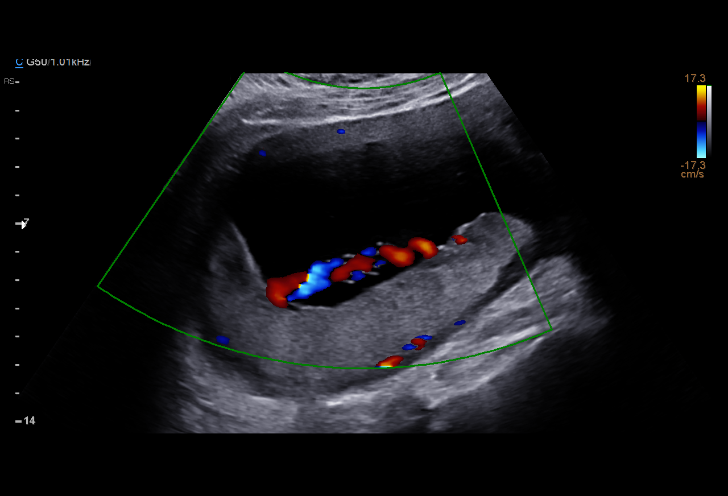
[im 24/90]
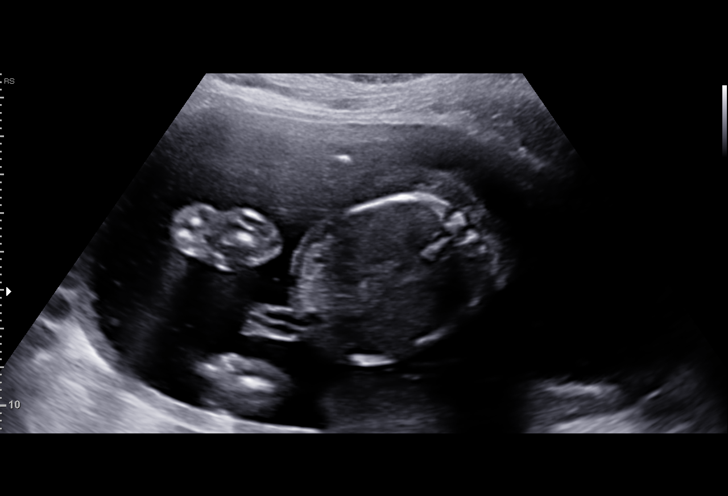
[im 30/90]
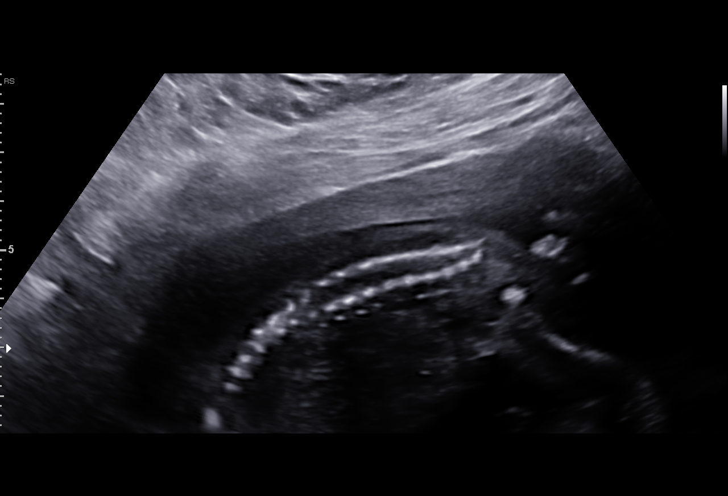
[im 37/90]
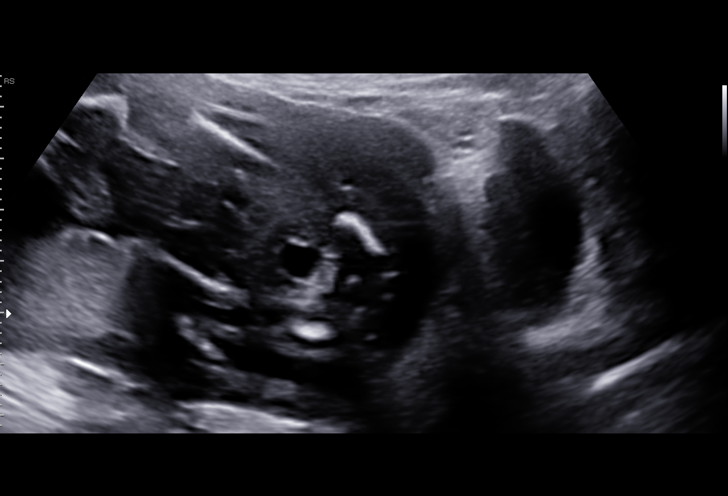
[im 43/90]
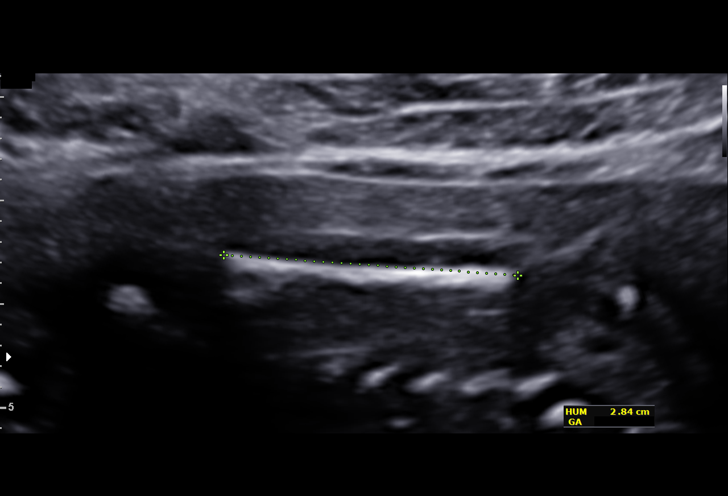
[im 50/90]
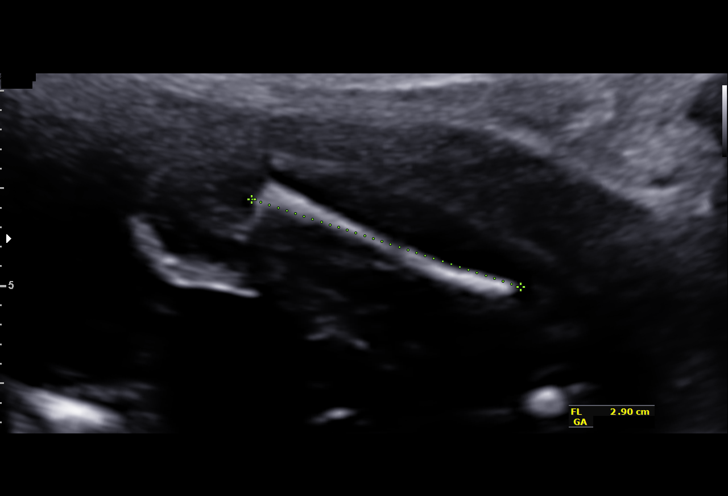
[im 57/90]
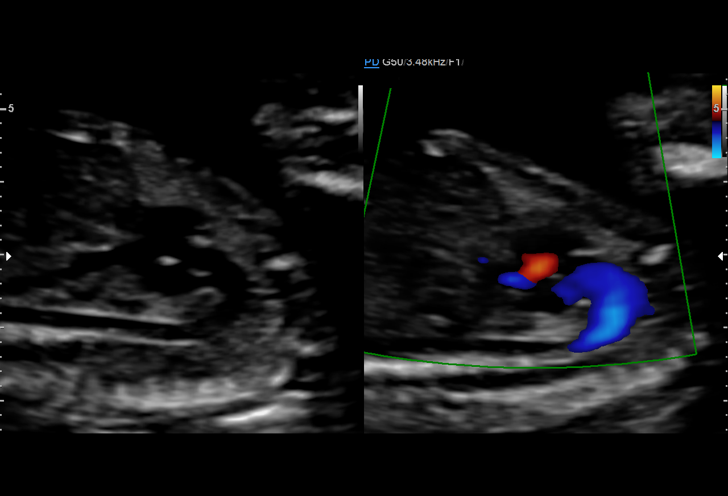
[im 63/90]
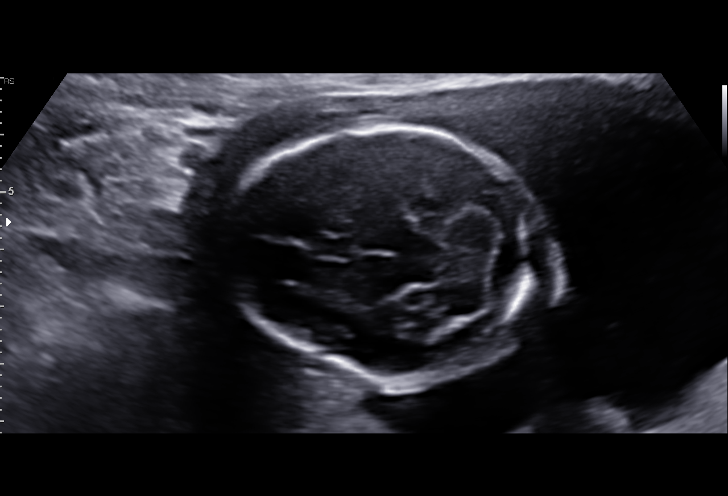
[im 70/90]
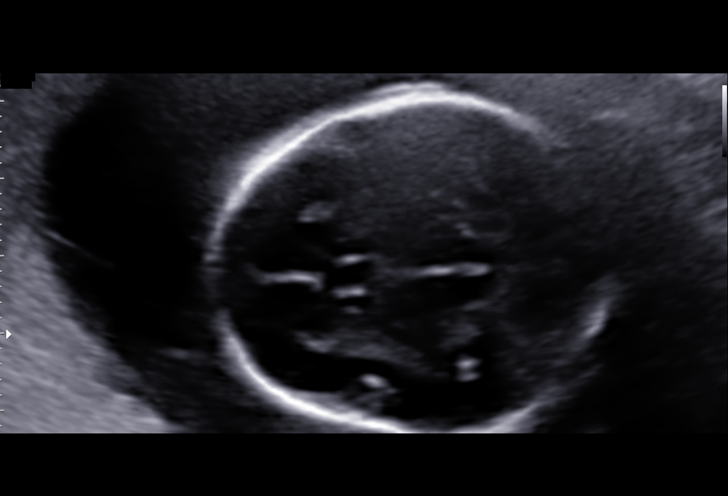
[im 76/90]
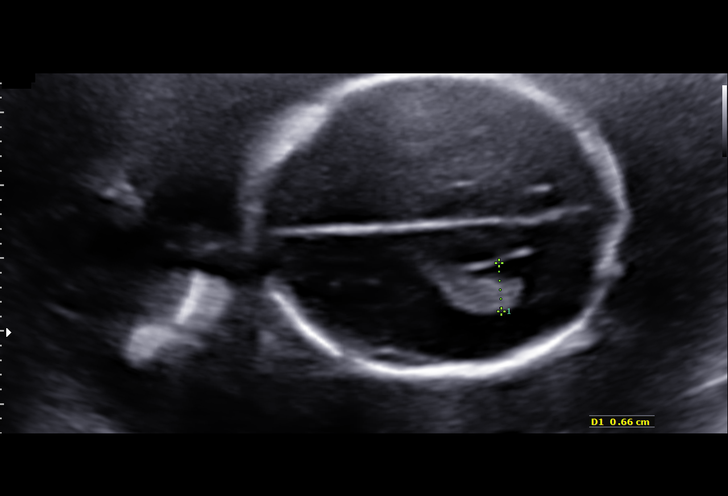
[im 83/90]
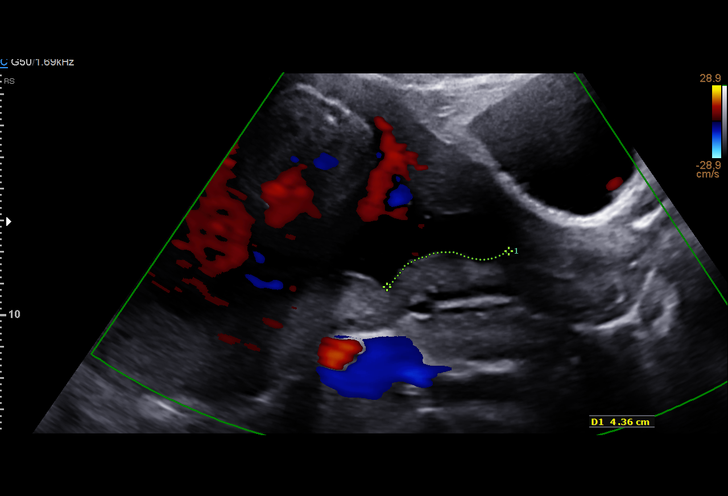
[im 90/90]
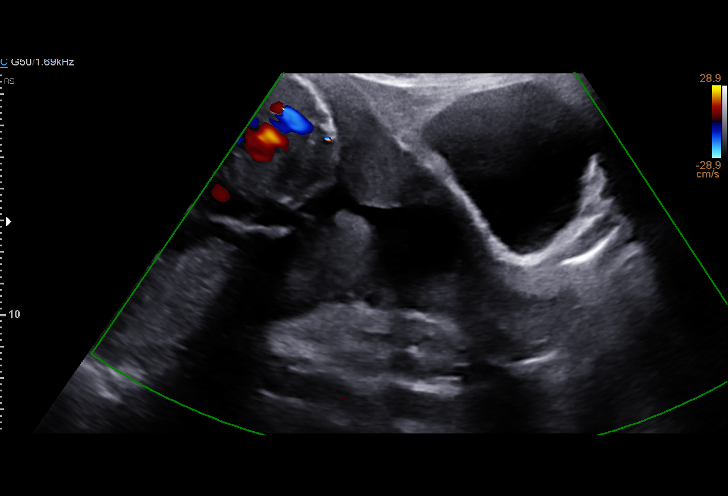

[14 of 28 positions shown; findings below may reference images not displayed]

1  DIETETICA ARENZANA            [id number]      [id number]     [id number]
Indications

20 weeks gestation of pregnancy
Encounter for antenatal screening for
malformations
OB History

Gravidity:    1
Fetal Evaluation

Num Of Fetuses:     1
Fetal Heart         158
Rate(bpm):
Cardiac Activity:   Observed
Presentation:       Variable
Placenta:           Posterior, above cervical os
P. Cord Insertion:  Visualized, central

Amniotic Fluid
AFI FV:      Subjectively within normal limits
Biometry

BPD:      43.9  mm     G. Age:  19w 2d         17  %    CI:         72.72  %    70 - 86
FL/HC:       17.8  %    16.8 -
HC:      163.7  mm     G. Age:  19w 1d          8  %    HC/AC:       1.11       1.09 -
AC:      147.5  mm     G. Age:  20w 0d         41  %    FL/BPD:      66.5  %
FL:       29.2  mm     G. Age:  19w 0d         10  %    FL/AC:       19.8  %    20 - 24
HUM:        29  mm     G. Age:  19w 3d         34  %
CER:      21.7  mm     G. Age:  20w 5d         58  %
NFT:       5.4  mm
CM:        4.3  mm
Est. FW:     296   gm    0 lb 10 oz     36  %
Gestational Age

LMP:           20w 1d        Date:  02/10/16                 EDD:    11/16/16
U/S Today:     19w 3d                                        EDD:    11/21/16
Best:          20w 1d     Det. By:  LMP  (02/10/16)          EDD:    11/16/16
Anatomy

Cranium:               Appears normal         Aortic Arch:            Appears normal
Cavum:                 Appears normal         Ductal Arch:            Appears normal
Ventricles:            Appears normal         Diaphragm:              Not well visualized
Choroid Plexus:        Appears normal         Stomach:                Appears normal, left
sided
Cerebellum:            Appears normal         Abdomen:                Appears normal
Posterior Fossa:       Appears normal         Abdominal Wall:         Appears nml (cord
insert, abd wall)
Nuchal Fold:           Appears normal         Cord Vessels:           Appears normal (3
vessel cord)
Face:                  Profile appears        Kidneys:                Appear normal
normal
Lips:                  Appears normal         Bladder:                Appears normal
Thoracic:              Appears normal         Spine:                  Appears normal
Heart:                 Not well visualized    Upper Extremities:      Appears normal
RVOT:                  Not well visualized    Lower Extremities:      Appears normal
LVOT:                  Appears normal

Other:  Fetus appears to be a male. 5th digit visualized. Technically difficult
due to fetal position.
Cervix Uterus Adnexa

Cervix
Length:            3.5  cm.
Normal appearance by transabdominal scan.

Uterus
No abnormality visualized.

Left Ovary
Not visualized.

Right Ovary
Not visualized.

Adnexa:       No abnormality visualized. No adnexal mass
visualized.
Impression

SIUP at 20+1 weeks
Normal detailed fetal anatomy; limited views of orbits, 4-
chamber views, RVOT and diaphragm
Markers of aneuploidy: none
Normal amniotic fluid volume
Measurements consistent with LMP dating
Recommendations

Suggest follow-up ultrasound to complete anatomy survey

## 2017-03-22 IMAGING — US US MFM OB FOLLOW-UP
1 series · 14 of 28 positions shown · non-contrast
Comparison: none

[Series 1: us mfm ob follow-up · 49 acquisitions, 14 frames shown]
[im 2/49]
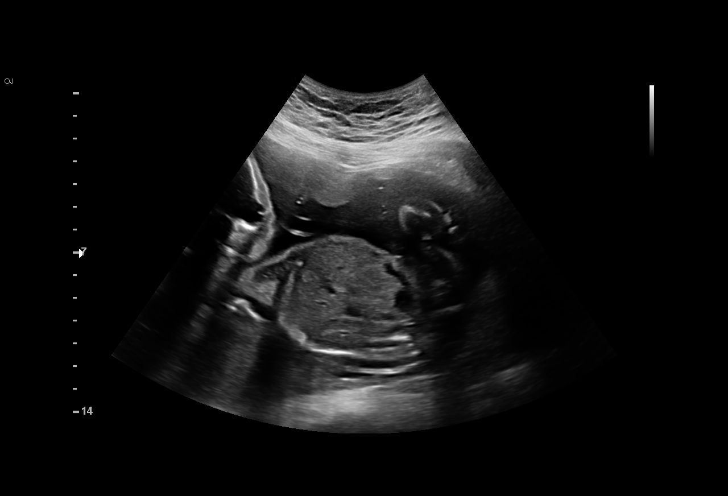
[im 6/49]
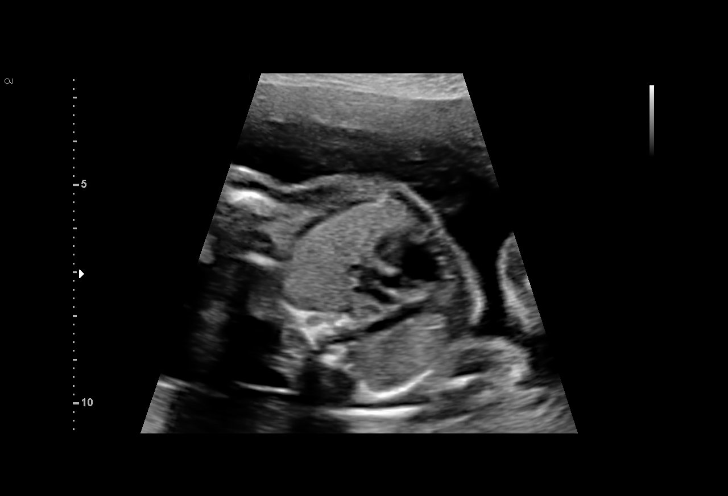
[im 9/49]
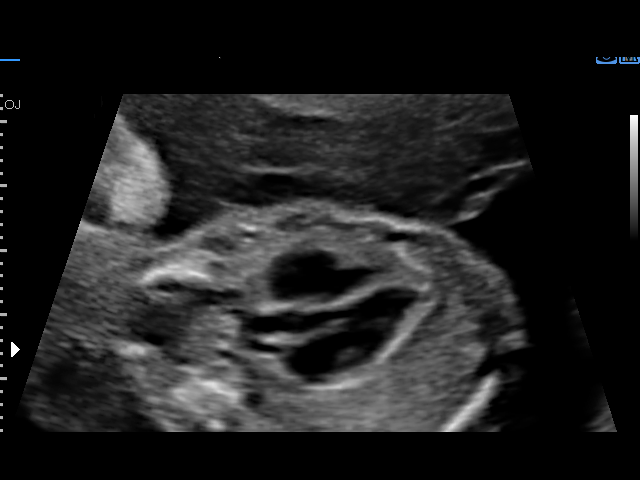
[im 13/49]
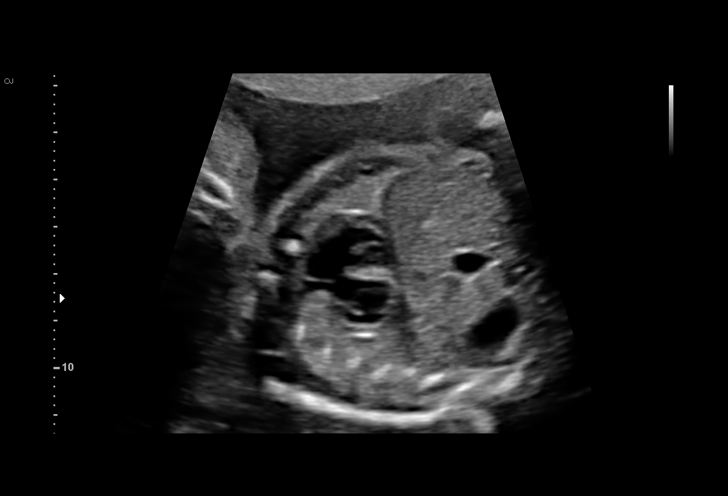
[im 17/49]
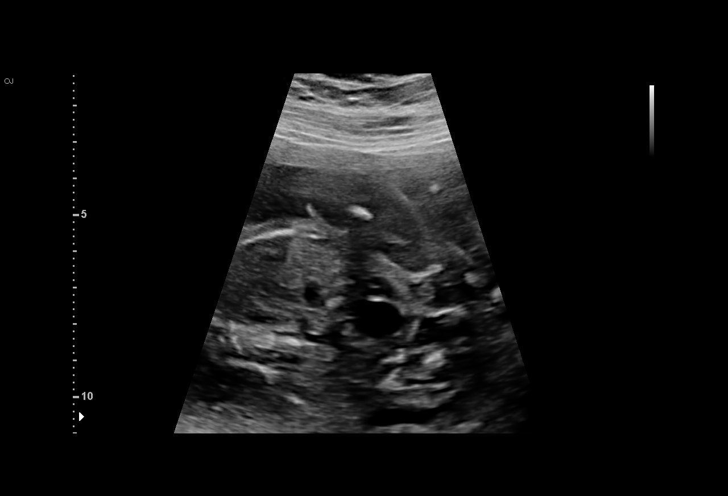
[im 20/49]
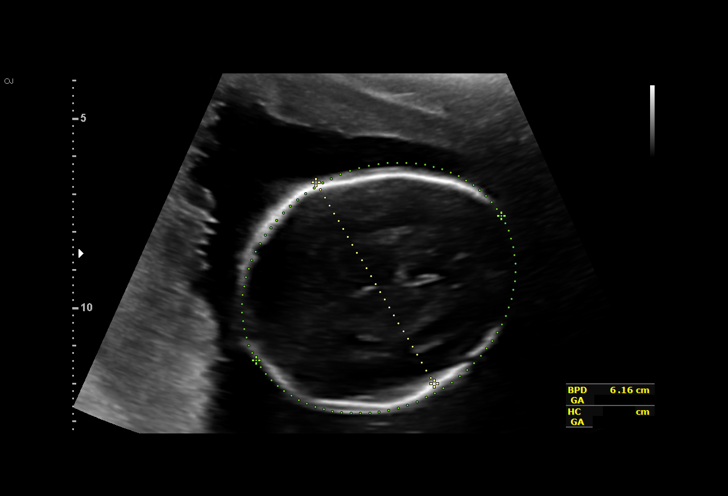
[im 24/49]
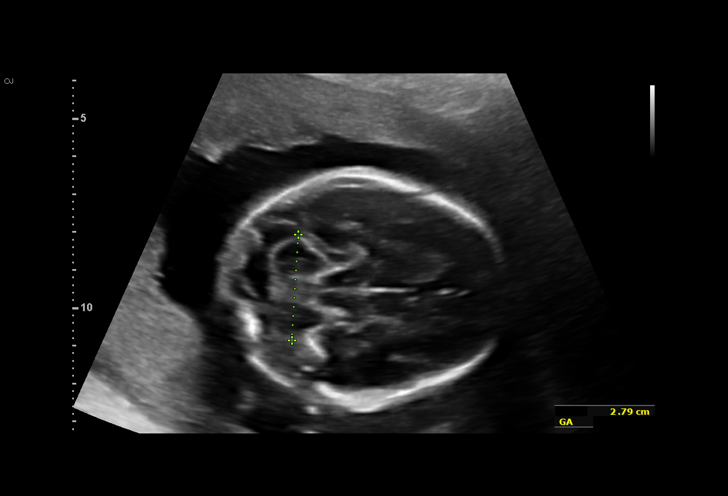
[im 27/49]
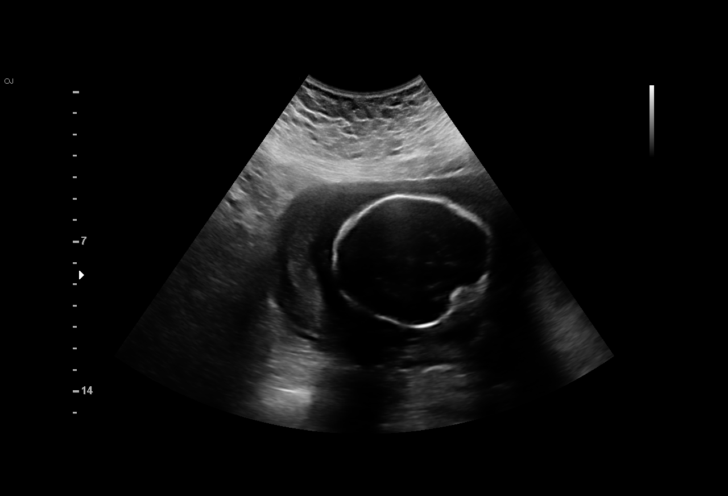
[im 31/49]
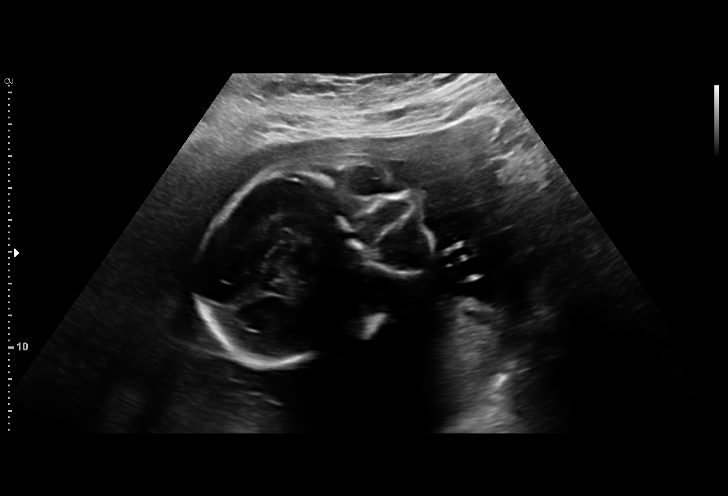
[im 34/49]
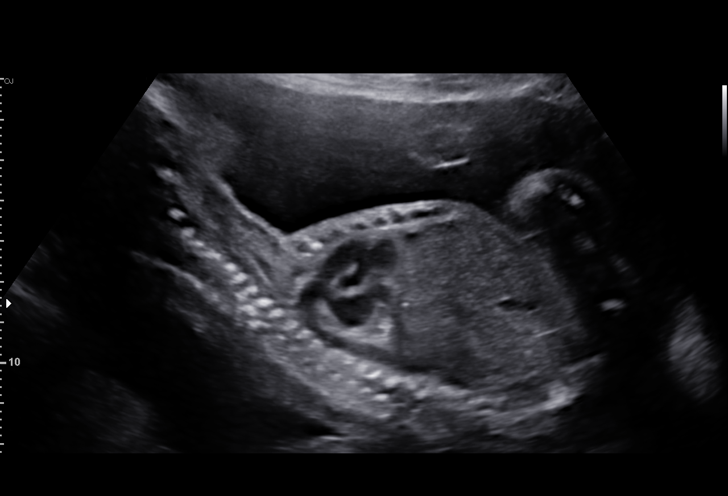
[im 38/49]
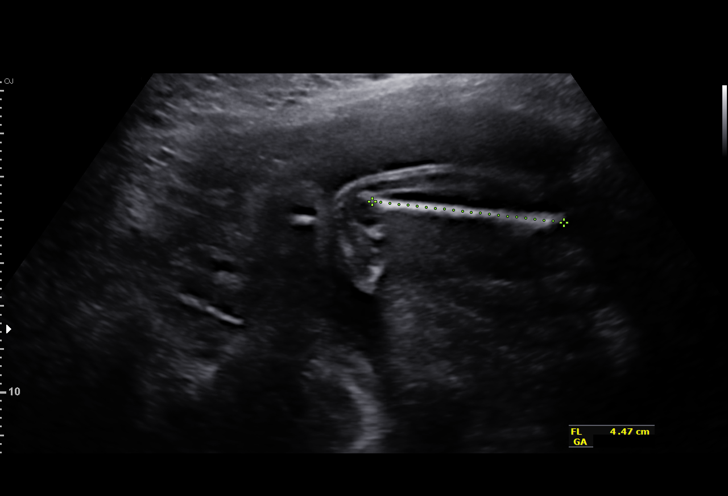
[im 41/49]
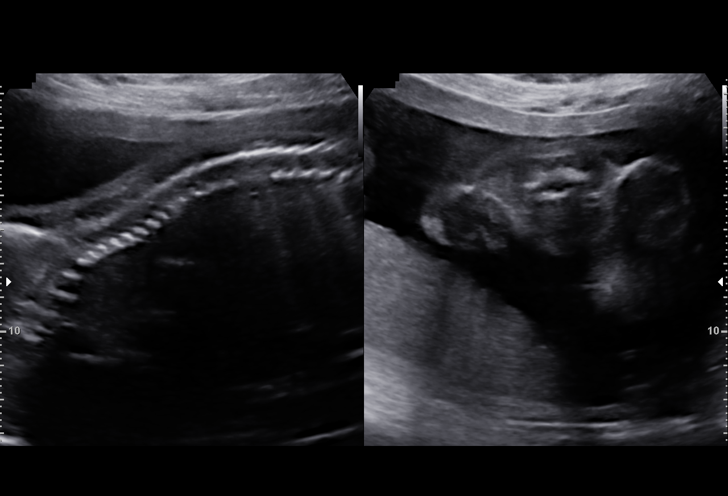
[im 45/49]
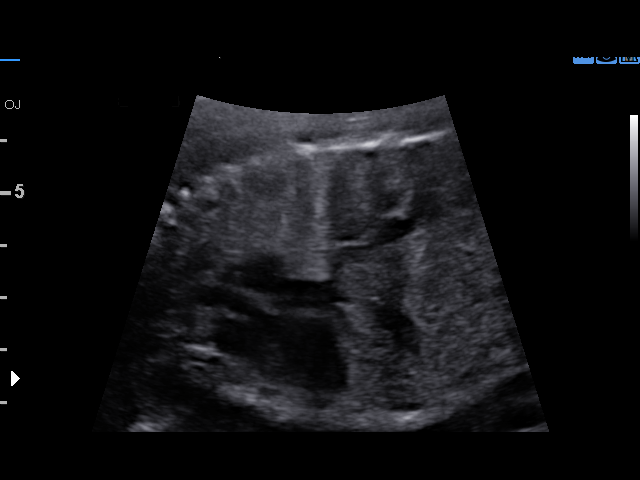
[im 49/49]
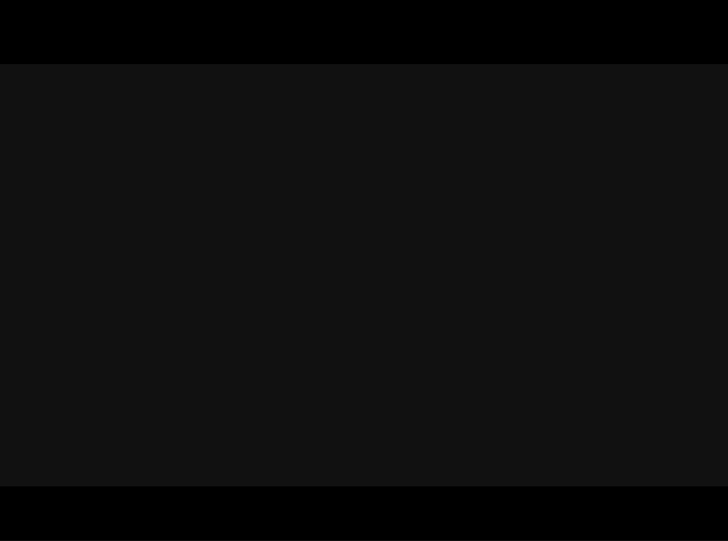

[14 of 28 positions shown; findings below may reference images not displayed]

1  JOANNIE HEPBURN            [id number]      [id number]     [id number]
Indications

24 weeks gestation of pregnancy
Encounter for other antenatal screening
follow-up
OB History

Gravidity:    1
Fetal Evaluation

Num Of Fetuses:     1
Fetal Heart         145
Rate(bpm):
Cardiac Activity:   Observed
Presentation:       Breech
Placenta:           Posterior, above cervical os
P. Cord Insertion:  Previously Visualized

Amniotic Fluid
AFI FV:      Subjectively within normal limits

Largest Pocket(cm)
4.8
Biometry

BPD:      61.2  mm     G. Age:  24w 6d         48  %    CI:        77.79   %    70 - 86
FL/HC:      20.4   %    18.7 -
HC:      219.6  mm     G. Age:  24w 0d         10  %    HC/AC:      1.16        1.04 -
AC:      189.6  mm     G. Age:  23w 5d         15  %    FL/BPD:     73.0   %    71 - 87
FL:       44.7  mm     G. Age:  24w 5d         38  %    FL/AC:      23.6   %    20 - 24
HUM:      44.4  mm     G. Age:  26w 2d         83  %
CER:      27.4  mm     G. Age:  24w 5d         50  %

Est. FW:     669  gm      1 lb 8 oz     40  %
Gestational Age

LMP:           24w 5d        Date:  02/10/16                 EDD:   11/16/16
U/S Today:     24w 2d                                        EDD:   11/19/16
Best:          24w 5d     Det. By:  LMP  (02/10/16)          EDD:   11/16/16
Anatomy

Cranium:               Appears normal         Aortic Arch:            Appears normal
Cavum:                 Appears normal         Ductal Arch:            Appears normal
Ventricles:            Appears normal         Diaphragm:              Appears normal
Choroid Plexus:        Previously seen        Stomach:                Appears normal, left
sided
Cerebellum:            Appears normal         Abdomen:                Appears normal
Posterior Fossa:       Appears normal         Abdominal Wall:         Previously seen
Nuchal Fold:           Appears normal         Cord Vessels:           Previously seen
Face:                  Orbits appear          Kidneys:                Appear normal
normal
Lips:                  Previously seen        Bladder:                Appears normal
Thoracic:              Appears normal         Spine:                  Previously seen
Heart:                 Appears normal         Upper Extremities:      Previously seen
(4CH, axis, and situs
RVOT:                  Appears normal         Lower Extremities:      Previously seen
LVOT:                  Appears normal

Other:  Fetus appears to be a male. 5th digit visualized. Technically difficult
due to fetal position.
Cervix Uterus Adnexa

Cervix
Length:           3.27  cm.
Normal appearance by transabdominal scan.

Uterus
No abnormality visualized.

Left Ovary
Not visualized. No adnexal mass visualized.

Right Ovary
Not visualized. No adnexal mass visualized.
Impression

Single IUP at 24w 5d
Normal interval anatomy - the anatomic survey is now
complete
Interval fetal growth is appropriate (40th %tile)
Posterior placenta without previa
Normal amniotic fluid volume
Recommendations

Follow-up ultrasounds as clinically indicated.

## 2017-04-13 ENCOUNTER — Encounter (HOSPITAL_COMMUNITY): Payer: Self-pay

## 2017-08-29 NOTE — L&D Delivery Note (Signed)
OB/GYN Faculty Practice Delivery Note  Annette Juarez is a 22 y.o. G2P1001 s/p SVD at 5763w1d. She was admitted for IOL for gestational hypertension.   ROM: 0h 9367m with clear fluid, SROM GBS Status: positive - received PCN x 2   Maximum Maternal Temperature: Temp (24hrs), Avg:97.9 F (36.6 C), Min:97.8 F (36.6 C), Max:98.1 F (36.7 C)  Labor Progress: . Presented to [facility] with pelvic pressure and hemorrhoids, mild contractions, elevated BP . Induction started with SVE at 1cm - FB placed and 1 dose of vaginal cytotec . 5/70/-2 with BBOW, transitioned to pitocin . Pitocin off because of subtle late decelerations, progressed to complete  Delivery Date/Time: 07/23/18 at 0530  Delivery: Called to room and patient was complete and pushing. Head delivered ROA. No nuchal cord present. Difficulty in delivery of shoulders - alerted team to shoulder dystocia, placed in [place] with limited movement of shoulders. Woods screw maneuver with pressure on anterior shoulder resulted in successful delivery of infant. Shoulder and body then delivered in usual fashion. Infant with spontaneous cry, placed on mother's abdomen, dried and stimulated. Cord clamped x 2 after 1-minute delay, and cut by father of baby. Cord blood drawn. Placenta delivered spontaneously with gentle cord traction. Fundus firm with massage and Pitocin. Labia, perineum, vagina, and cervix inspected inspected with no lacerations. Infant vigorous and crying initially but then decreased respiratory effort so taken to warmer for additional stimulation, suctioning and improved.   Placenta: spontaneous, intact, 3-vessel cord Complications: shoulder dystocia x 90 seconds  resuscitation in warmer with suctioning and blow-by oxygen with improvement in color, tone, respiratory effort Lacerations: none EBL: 100cc Analgesia: epidural   Postpartum Planning [x]  message to sent to schedule follow-up  [x]  vaccines UTD -- notified OR team of planned  BTL, consent signed 9/19, will keep NPO  Infant: vigorous female  APGARs 7, 9  weight pending, AGA  [name] S. Earlene PlaterWallace, DO OB/GYN Fellow, Faculty Practice

## 2018-01-11 ENCOUNTER — Ambulatory Visit: Payer: Medicaid Other

## 2018-01-19 ENCOUNTER — Encounter (HOSPITAL_COMMUNITY): Payer: Self-pay | Admitting: *Deleted

## 2018-01-19 ENCOUNTER — Ambulatory Visit (INDEPENDENT_AMBULATORY_CARE_PROVIDER_SITE_OTHER): Payer: Medicaid Other

## 2018-01-19 ENCOUNTER — Inpatient Hospital Stay (HOSPITAL_COMMUNITY)
Admission: AD | Admit: 2018-01-19 | Discharge: 2018-01-19 | Disposition: A | Discharge status: Home / Self Care | Payer: Medicaid Other | Source: Ambulatory Visit | Attending: Obstetrics and Gynecology | Admitting: Obstetrics and Gynecology

## 2018-01-19 VITALS — BP 137/81 | HR 109 | Temp 98.4°F | Ht 63.0 in | Wt 201.2 lb

## 2018-01-19 DIAGNOSIS — Z3A13 13 weeks gestation of pregnancy: Secondary | ICD-10-CM | POA: Diagnosis not present

## 2018-01-19 DIAGNOSIS — Z3201 Encounter for pregnancy test, result positive: Secondary | ICD-10-CM

## 2018-01-19 DIAGNOSIS — Z6791 Unspecified blood type, Rh negative: Secondary | ICD-10-CM | POA: Diagnosis not present

## 2018-01-19 DIAGNOSIS — O26851 Spotting complicating pregnancy, first trimester: Secondary | ICD-10-CM | POA: Diagnosis not present

## 2018-01-19 DIAGNOSIS — O209 Hemorrhage in early pregnancy, unspecified: Secondary | ICD-10-CM

## 2018-01-19 DIAGNOSIS — O26891 Other specified pregnancy related conditions, first trimester: Secondary | ICD-10-CM | POA: Diagnosis not present

## 2018-01-19 LAB — WET PREP, GENITAL
SPERM: NONE SEEN
TRICH WET PREP: NONE SEEN
YEAST WET PREP: NONE SEEN

## 2018-01-19 LAB — URINALYSIS, ROUTINE W REFLEX MICROSCOPIC
BILIRUBIN URINE: NEGATIVE
Glucose, UA: NEGATIVE mg/dL
Hgb urine dipstick: NEGATIVE
Ketones, ur: NEGATIVE mg/dL
Nitrite: NEGATIVE
PROTEIN: NEGATIVE mg/dL
SPECIFIC GRAVITY, URINE: 1.02 (ref 1.005–1.030)
pH: 7 (ref 5.0–8.0)

## 2018-01-19 LAB — POCT URINE PREGNANCY: Preg Test, Ur: POSITIVE — AB

## 2018-01-19 MED ORDER — RHO D IMMUNE GLOBULIN 1500 UNIT/2ML IJ SOSY
300.0000 ug | PREFILLED_SYRINGE | Freq: Once | INTRAMUSCULAR | Status: AC
  Administered 2018-01-19: 300 ug via INTRAMUSCULAR
  Filled 2018-01-19: qty 2

## 2018-01-19 NOTE — Discharge Instructions (Signed)

## 2018-01-19 NOTE — Progress Notes (Signed)
Annette Juarez presents today for UPT. She has no unusual complaints and complains of nausea with vomiting for 10 days and light spotting.  LMP: Sometime in Feb. ( 09/2017). Unknown Wt. 201.2 lbs prev. Wt. 210 lbs     OBJECTIVE: Appears well, in no apparent distress.   OB History    Gravida  1   Para      Term      Preterm      AB      Living  0     SAB      TAB      Ectopic      Multiple      Live Births             Home UPT Result: POSITIVE In-Office UPT result: POSITIVE I have reviewed the patient's medical, obstetrical, social, and family histories, and medications.   ASSESSMENT: Positive pregnancy test   PLAN Prenatal care to be completed at: [facility]. To send PNV and Nausea Rx to pharmacy. Patient advised to go to [facility]U for an [place] to determine GA and for spotting.

## 2018-01-19 NOTE — MAU Note (Signed)
Pt reports spotting and cramping.LMP 10/16/17. Positive preg test today

## 2018-01-19 NOTE — MAU Provider Note (Signed)
History     CSN: [id number]  Arrival date and time: 01/19/18 1147   First Provider Initiated Contact with Patient 01/19/18 1316      Chief Complaint  Patient presents with  . Vaginal Bleeding  . Abdominal Pain   HPI  Annette Juarez is a 22 y.o. G2P1000 at [redacted]w[redacted]d by unsure LMP who presents with abdominal cramping and vaginal spotting. Went to [facility] this morning, was seen by the nurse and sent here for evaluation. Reports intermittent lower abdominal cramping for the last week. Rates pain 0/10 currently. Denies dysuria, vaginal discharge, diarrhea, fever. Endorses some nausea and vomiting; was rx antiemetic at [facility] this morning. Also reports episode of bright red blood on toilet paper last night. Since then has turned to pink. Not bleeding into underwear. No recent intercourse.   OB History    Gravida  2   Para  1   Term  1   Preterm      AB      Living  0     SAB      TAB      Ectopic      Multiple      Live Births              No past medical history on file.  Past Surgical History:  Procedure Laterality Date  . NO PAST SURGERIES      Family History  Problem Relation Age of Onset  . Hypertension Mother   . Diabetes Father   . Asthma Sister   . Diabetes Maternal Grandmother   . Hypertension Maternal Grandmother   . Hypertension Maternal Grandfather     Social History   Tobacco Use  . Smoking status: Never Smoker  . Smokeless tobacco: Never Used  Substance Use Topics  . Alcohol use: No  . Drug use: No    Allergies: No Known Allergies  Medications Prior to Admission  Medication Sig Dispense Refill Last Dose  . amLODipine (NORVASC) 5 MG tablet Take 1 tablet (5 mg total) by mouth daily. (Patient not taking: Reported on 01/19/2018) 30 tablet 5 Not Taking  . hydrocortisone (ANUSOL-HC) 25 MG suppository Place 1 suppository (25 mg total) rectally 2 (two) times daily. (Patient not taking: Reported on 01/19/2018) 12 suppository 0 Not Taking  .  ibuprofen (ADVIL,MOTRIN) 600 MG tablet Take 1 tablet (600 mg total) by mouth every 6 (six) hours. (Patient not taking: Reported on 01/19/2018) 120 tablet 2 Not Taking  . Prenatal Multivit-Min-Fe-FA (PRENATAL VITAMINS) 0.8 MG tablet Take 1 tablet by mouth daily. (Patient not taking: Reported on 01/19/2018) 30 tablet 12 Not Taking    Review of Systems  Constitutional: Negative.   Gastrointestinal: Positive for abdominal pain, nausea and vomiting. Negative for constipation and diarrhea.  Genitourinary: Positive for vaginal bleeding. Negative for dysuria and vaginal discharge.   Physical Exam   Blood pressure 118/72, pulse 89, temperature 98.4 F (36.9 C), resp. rate 16, height  (1.6 m), weight 202 lb (91.6 kg), last menstrual period 10/15/2017, SpO2 100 %, unknown if currently breastfeeding.  Physical Exam  Nursing note and vitals reviewed. Constitutional: She is oriented to person, place, and time. She appears well-developed and well-nourished. No distress.  HENT:  Head: Normocephalic and atraumatic.  Eyes: Conjunctivae are normal. Right eye exhibits no discharge. Left eye exhibits no discharge. No scleral icterus.  Neck: Normal range of motion.  Respiratory: Effort normal. No respiratory distress.  GI: Soft. She exhibits no distension. There is no  tenderness. There is no rebound.  Genitourinary: No bleeding in the vagina.  Genitourinary Comments: Cervix closed  Neurological: She is alert and oriented to person, place, and time.  Skin: Skin is warm and dry. She is not diaphoretic.  Psychiatric: She has a normal mood and affect. Her behavior is normal. Judgment and thought content normal.    MAU Course  Procedures Results for orders placed or performed during the hospital encounter of 01/19/18 (from the past 24 hour(s))  Urinalysis, Routine w reflex microscopic     Status: Abnormal   Collection Time: 01/19/18 12:45 PM  Result Value Ref Range   Color, Urine YELLOW YELLOW    APPearance HAZY (A) CLEAR   Specific Gravity, Urine 1.020 1.005 - 1.030   pH 7.0 5.0 - 8.0   Glucose, UA NEGATIVE NEGATIVE mg/dL   Hgb urine dipstick NEGATIVE NEGATIVE   Bilirubin Urine NEGATIVE NEGATIVE   Ketones, ur NEGATIVE NEGATIVE mg/dL   Protein, ur NEGATIVE NEGATIVE mg/dL   Nitrite NEGATIVE NEGATIVE   Leukocytes, UA MODERATE (A) NEGATIVE   RBC / HPF 6-10 0 - 5 RBC/hpf   WBC, UA 21-50 0 - 5 WBC/hpf   Bacteria, UA RARE (A) NONE SEEN   Squamous Epithelial / LPF 6-10 0 - 5   Mucus PRESENT   Wet prep, genital     Status: Abnormal   Collection Time: 01/19/18  1:35 PM  Result Value Ref Range   Yeast Wet Prep HPF POC NONE SEEN NONE SEEN   Trich, Wet Prep NONE SEEN NONE SEEN   Clue Cells Wet Prep HPF POC PRESENT (A) NONE SEEN   WBC, Wet Prep HPF POC FEW (A) NONE SEEN   Sperm NONE SEEN   Rh IG workup (includes ABO/Rh)     Status: None (Preliminary result)   Collection Time: 01/19/18  1:49 PM  Result Value Ref Range   Gestational Age(Wks) 13    ABO/RH(D) O NEG    Antibody Screen NEG    Unit Number [id number]    Blood Component Type RHIG    Unit division 00    Status of Unit ISSUED    Transfusion Status      OK TO TRANSFUSE Performed at [facility], [address] [place], [place] [address]     MDM FHT 138 by doppler No blood on exam Informal bedside ultrasound shows SIUP measuring [redacted]w[redacted]d by CRL  O negative blood type. Rhogam given today   Assessment and Plan  A; 1. [redacted] weeks gestation of pregnancy   2. Vaginal bleeding in pregnancy, first trimester   3. Rh negative state in antepartum period, first trimester    P: Discharge home Start prenatal care Discussed reasons to return to MAU GC/Ct & urine culture pending  Judeth Horn 01/19/2018, 1:16 PM

## 2018-01-20 LAB — CULTURE, OB URINE

## 2018-01-20 LAB — RH IG WORKUP (INCLUDES ABO/RH)
ABO/RH(D): O NEG
Antibody Screen: NEGATIVE
Gestational Age(Wks): 13
UNIT DIVISION: 0

## 2018-01-23 ENCOUNTER — Other Ambulatory Visit: Payer: Self-pay | Admitting: Certified Nurse Midwife

## 2018-01-23 DIAGNOSIS — O0990 Supervision of high risk pregnancy, unspecified, unspecified trimester: Secondary | ICD-10-CM

## 2018-01-23 DIAGNOSIS — O219 Vomiting of pregnancy, unspecified: Secondary | ICD-10-CM

## 2018-01-23 LAB — GC/CHLAMYDIA PROBE AMP (~~LOC~~) NOT AT ARMC
CHLAMYDIA, DNA PROBE: NEGATIVE
Neisseria Gonorrhea: NEGATIVE

## 2018-01-23 MED ORDER — DOXYLAMINE-PYRIDOXINE 10-10 MG PO TBEC: DELAYED_RELEASE_TABLET | ORAL | 4 refills | Status: DC

## 2018-01-23 MED ORDER — VITAFOL-NANO 18-0.6-0.4 MG PO TABS: 1.0000 | ORAL_TABLET | Freq: Every day | ORAL | 12 refills | Status: DC

## 2018-01-23 NOTE — Progress Notes (Signed)
RX Vitafol Nano PNV and Diclegis for N&V sent to the pharmacy. I have reviewed the chart and agree with nursing staff's documentation of this patient's encounter.  Roe Coombs, CNM 01/23/2018 8:04 AM

## 2018-02-20 ENCOUNTER — Encounter: Payer: Medicaid Other | Admitting: Certified Nurse Midwife

## 2018-02-26 ENCOUNTER — Telehealth: Payer: Self-pay | Admitting: *Deleted

## 2018-02-26 NOTE — Telephone Encounter (Signed)
Called patient to reschedule New OB  or to let us know her plans, no answers and unable to leave a voicemail.Marland Kitchen.Marland Kitchen..Marland Kitchen

## 2018-03-23 ENCOUNTER — Inpatient Hospital Stay (HOSPITAL_COMMUNITY)
Admission: AD | Admit: 2018-03-23 | Discharge: 2018-03-23 | Disposition: A | Discharge status: Home / Self Care | Payer: Medicaid Other | Source: Ambulatory Visit | Attending: Obstetrics and Gynecology | Admitting: Obstetrics and Gynecology

## 2018-03-23 ENCOUNTER — Encounter (HOSPITAL_COMMUNITY): Payer: Self-pay

## 2018-03-23 DIAGNOSIS — O23592 Infection of other part of genital tract in pregnancy, second trimester: Secondary | ICD-10-CM | POA: Diagnosis not present

## 2018-03-23 DIAGNOSIS — O2342 Unspecified infection of urinary tract in pregnancy, second trimester: Secondary | ICD-10-CM | POA: Diagnosis not present

## 2018-03-23 DIAGNOSIS — B9689 Other specified bacterial agents as the cause of diseases classified elsewhere: Secondary | ICD-10-CM

## 2018-03-23 DIAGNOSIS — Z79899 Other long term (current) drug therapy: Secondary | ICD-10-CM | POA: Insufficient documentation

## 2018-03-23 DIAGNOSIS — N760 Acute vaginitis: Secondary | ICD-10-CM

## 2018-03-23 DIAGNOSIS — Z3A22 22 weeks gestation of pregnancy: Secondary | ICD-10-CM | POA: Diagnosis not present

## 2018-03-23 DIAGNOSIS — O24912 Unspecified diabetes mellitus in pregnancy, second trimester: Secondary | ICD-10-CM | POA: Insufficient documentation

## 2018-03-23 DIAGNOSIS — R1030 Lower abdominal pain, unspecified: Secondary | ICD-10-CM | POA: Diagnosis not present

## 2018-03-23 DIAGNOSIS — R109 Unspecified abdominal pain: Secondary | ICD-10-CM

## 2018-03-23 DIAGNOSIS — O0932 Supervision of pregnancy with insufficient antenatal care, second trimester: Secondary | ICD-10-CM | POA: Insufficient documentation

## 2018-03-23 DIAGNOSIS — O26892 Other specified pregnancy related conditions, second trimester: Secondary | ICD-10-CM

## 2018-03-23 DIAGNOSIS — M545 Low back pain: Secondary | ICD-10-CM | POA: Diagnosis present

## 2018-03-23 HISTORY — DX: Gestational diabetes mellitus in pregnancy, unspecified control: O24.419

## 2018-03-23 HISTORY — DX: Type 2 diabetes mellitus without complications: E11.9

## 2018-03-23 LAB — URINALYSIS, ROUTINE W REFLEX MICROSCOPIC
Bilirubin Urine: NEGATIVE
GLUCOSE, UA: NEGATIVE mg/dL
Hgb urine dipstick: NEGATIVE
KETONES UR: NEGATIVE mg/dL
Nitrite: NEGATIVE
PH: 7 (ref 5.0–8.0)
Protein, ur: NEGATIVE mg/dL
Specific Gravity, Urine: 1.011 (ref 1.005–1.030)

## 2018-03-23 LAB — WET PREP, GENITAL
Sperm: NONE SEEN
TRICH WET PREP: NONE SEEN
Yeast Wet Prep HPF POC: NONE SEEN

## 2018-03-23 LAB — CBC
HCT: 28.5 % — ABNORMAL LOW (ref 36.0–46.0)
Hemoglobin: 10.2 g/dL — ABNORMAL LOW (ref 12.0–15.0)
MCH: 33.7 pg (ref 26.0–34.0)
MCHC: 35.8 g/dL (ref 30.0–36.0)
MCV: 94.1 fL (ref 78.0–100.0)
PLATELETS: 325 10*3/uL (ref 150–400)
RBC: 3.03 MIL/uL — AB (ref 3.87–5.11)
RDW: 12.8 % (ref 11.5–15.5)
WBC: 8.2 10*3/uL (ref 4.0–10.5)

## 2018-03-23 MED ORDER — METRONIDAZOLE 500 MG PO TABS: 500.0000 mg | ORAL_TABLET | Freq: Two times a day (BID) | ORAL | 0 refills | Status: AC

## 2018-03-23 MED ORDER — SULFAMETHOXAZOLE-TRIMETHOPRIM 400-80 MG PO TABS: 1.0000 | ORAL_TABLET | Freq: Two times a day (BID) | ORAL | 0 refills | Status: DC

## 2018-03-23 NOTE — MAU Provider Note (Signed)
Chief Complaint: Pelvic Pain   First Provider Initiated Contact with Patient 03/23/18 0945      SUBJECTIVE HPI: Annette Juarez is a 22 y.o. G2P1000 at [redacted]w[redacted]d by LMP who presents to maternity admissions reporting lower abdominal cramping and low back pain x 3-4 days. She has not yet started prenatal care in this pregnancy.  She has not tried any treatments. Nothing makes the pain better or worse. There are no other associated symptoms. She denies vaginal bleeding, vaginal itching/burning, urinary symptoms, h/a, dizziness, n/v, or fever/chills.     HPI  Past Medical History:  Diagnosis Date  . Diabetes mellitus without complication (HCC)    Gestational diet control  . Gestational diabetes    Past Surgical History:  Procedure Laterality Date  . NO PAST SURGERIES     Social History   Socioeconomic History  . Marital status: Single    Spouse name: Not on file  . Number of children: Not on file  . Years of education: Not on file  . Highest education level: Not on file  Occupational History  . Not on file  Social Needs  . Financial resource strain: Not on file  . Food insecurity:    Worry: Not on file    Inability: Not on file  . Transportation needs:    Medical: Not on file    Non-medical: Not on file  Tobacco Use  . Smoking status: Never Smoker  . Smokeless tobacco: Never Used  Substance and Sexual Activity  . Alcohol use: No  . Drug use: No  . Sexual activity: Yes    Birth control/protection: None  Lifestyle  . Physical activity:    Days per week: Not on file    Minutes per session: Not on file  . Stress: Not on file  Relationships  . Social connections:    Talks on phone: Not on file    Gets together: Not on file    Attends religious service: Not on file    Active member of club or organization: Not on file    Attends meetings of clubs or organizations: Not on file    Relationship status: Not on file  . Intimate partner violence:    Fear of current or ex  partner: Not on file    Emotionally abused: Not on file    Physically abused: Not on file    Forced sexual activity: Not on file  Other Topics Concern  . Not on file  Social History Narrative  . Not on file   No current facility-administered medications on file prior to encounter.    Current Outpatient Medications on File Prior to Encounter  Medication Sig Dispense Refill  . Doxylamine-Pyridoxine (DICLEGIS) 10-10 MG TBEC Take 1 tablet with breakfast and lunch.  Take 2 tablets at bedtime. 100 tablet 4  . Prenatal-Fe Fum-Methf-FA w/o A (VITAFOL-NANO) 18-0.6-0.4 MG TABS Take 1 tablet by mouth daily. 30 tablet 12   No Known Allergies  ROS:  Review of Systems  Constitutional: Negative for chills, fatigue and fever.  Respiratory: Negative for shortness of breath.   Cardiovascular: Negative for chest pain.  Gastrointestinal: Positive for abdominal pain. Negative for nausea and vomiting.  Genitourinary: Positive for pelvic pain. Negative for difficulty urinating, dysuria, flank pain, vaginal bleeding, vaginal discharge and vaginal pain.  Musculoskeletal: Positive for back pain.  Neurological: Negative for dizziness and headaches.  Psychiatric/Behavioral: Negative.      I have reviewed patient's Past Medical Hx, Surgical Hx, Family Hx, Social  Hx, medications and allergies.   Physical Exam   Patient Vitals for the past 24 hrs:  BP Temp Temp src Pulse Resp SpO2 Weight  03/23/18 0905 125/67 98.3 F (36.8 C) Oral 93 18 100 % 201 lb 1.9 oz (91.2 kg)   Constitutional: Well-developed, well-nourished female in no acute distress.  Cardiovascular: normal rate Respiratory: normal effort GI: Abd soft, non-tender. Pos BS x 4 MS: Extremities nontender, no edema, normal ROM Neurologic: Alert and oriented x 4.  GU: Neg CVAT.  PELVIC EXAM: Cervix pink, visually closed, without lesion, moderate amount thin white discharge, vaginal walls and external genitalia normal Bimanual exam: Cervix  0/long/high, posterior, suprapubic pain noted on exam  FHT 145 by doppler  LAB RESULTS Results for orders placed or performed during the hospital encounter of 03/23/18 (from the past 24 hour(s))  Urinalysis, Routine w reflex microscopic     Status: Abnormal   Collection Time: 03/23/18  8:07 AM  Result Value Ref Range   Color, Urine YELLOW YELLOW   APPearance CLEAR CLEAR   Specific Gravity, Urine 1.011 1.005 - 1.030   pH 7.0 5.0 - 8.0   Glucose, UA NEGATIVE NEGATIVE mg/dL   Hgb urine dipstick NEGATIVE NEGATIVE   Bilirubin Urine NEGATIVE NEGATIVE   Ketones, ur NEGATIVE NEGATIVE mg/dL   Protein, ur NEGATIVE NEGATIVE mg/dL   Nitrite NEGATIVE NEGATIVE   Leukocytes, UA MODERATE (A) NEGATIVE   RBC / HPF 0-5 0 - 5 RBC/hpf   WBC, UA 11-20 0 - 5 WBC/hpf   Bacteria, UA RARE (A) NONE SEEN   Squamous Epithelial / LPF 6-10 0 - 5   Mucus PRESENT   Wet prep, genital     Status: Abnormal   Collection Time: 03/23/18 10:16 AM  Result Value Ref Range   Yeast Wet Prep HPF POC NONE SEEN NONE SEEN   Trich, Wet Prep NONE SEEN NONE SEEN   Clue Cells Wet Prep HPF POC PRESENT (A) NONE SEEN   WBC, Wet Prep HPF POC MODERATE (A) NONE SEEN   Sperm NONE SEEN   CBC     Status: Abnormal   Collection Time: 03/23/18 10:23 AM  Result Value Ref Range   WBC 8.2 4.0 - 10.5 K/uL   RBC 3.03 (L) 3.87 - 5.11 MIL/uL   Hemoglobin 10.2 (L) 12.0 - 15.0 g/dL   HCT 16.1 (L) 09.6 - 04.5 %   MCV 94.1 78.0 - 100.0 fL   MCH 33.7 26.0 - 34.0 pg   MCHC 35.8 30.0 - 36.0 g/dL   RDW 40.9 81.1 - 91.4 %   Platelets 325 150 - 400 K/uL    --/--/O NEG (05/24 1349)  IMAGING No results found.  MAU Management/MDM: Ordered labs and reviewed results.  UA and suprapubic abdominal pain c/w UTI.  Clue cells and clinical exam c/w BV.  FHT normal on today's exam.  Will treat BV and UTI and order outpatient anatomy US. Rx for Bactrim DS BID x 7 days, Flagyl 500 mg BID x 7 days. Message to [facility] to establish prenatal care.  Pt discharged  with strict second trimester/abdominal pain precautions.  ASSESSMENT 1. Urinary tract infection in mother during second trimester of pregnancy   2. BV (bacterial vaginosis)   3. Late prenatal care affecting pregnancy in second trimester   4. Abdominal pain during pregnancy, second trimester     PLAN Discharge home Allergies as of 03/23/2018   No Known Allergies     Medication List    TAKE these  medications   Doxylamine-Pyridoxine 10-10 MG Tbec Commonly known as:  DICLEGIS Take 1 tablet with breakfast and lunch.  Take 2 tablets at bedtime.   metroNIDAZOLE 500 MG tablet Commonly known as:  FLAGYL Take 1 tablet (500 mg total) by mouth 2 (two) times daily for 7 days.   sulfamethoxazole-trimethoprim 400-80 MG tablet Commonly known as:  BACTRIM Take 1 tablet by mouth 2 (two) times daily.   VITAFOL-NANO 18-0.6-0.4 MG Tabs Take 1 tablet by mouth daily.      Follow-up Information    [facility] Follow [facility].   Contact information: [address] [place] [phone]       [facility] Follow up.   Specialty:  Obstetrics and Gynecology Contact information: [address] [place] [phone]       [facility] [place] Follow up.   Specialty:  Obstetrics and Gynecology Contact information: [address] [place] [phone]          Sharen CounterLisa Leftwich-Kirby Certified Nurse-Midwife 03/23/2018  8:20 PM

## 2018-03-23 NOTE — MAU Note (Signed)
Pt is having pelvic/lower abdominal pain. Having some pink spotting. Pain 10/10.

## 2018-03-23 NOTE — Discharge Instructions (Signed)
Bacterial Vaginosis Bacterial vaginosis is a vaginal infection that occurs when the normal balance of bacteria in the vagina is disrupted. It results from an overgrowth of certain bacteria. This is the most common vaginal infection among women ages 7-44. Because bacterial vaginosis increases your risk for STIs (sexually transmitted infections), getting treated can help reduce your risk for chlamydia, gonorrhea, herpes, and HIV (human immunodeficiency virus). Treatment is also important for preventing complications in pregnant women, because this condition can cause an early (premature) delivery. What are the causes? This condition is caused by an increase in harmful bacteria that are normally present in small amounts in the vagina. However, the reason that the condition develops is not fully understood. What increases the risk? The following factors may make you more likely to develop this condition:  Having a new sexual partner or multiple sexual partners.  Having unprotected sex.  Douching.  Having an intrauterine device (IUD).  Smoking.  Drug and alcohol abuse.  Taking certain antibiotic medicines.  Being pregnant.  You cannot get bacterial vaginosis from toilet seats, bedding, swimming pools, or contact with objects around you. What are the signs or symptoms? Symptoms of this condition include:  Grey or white vaginal discharge. The discharge can also be watery or foamy.  A fish-like odor with discharge, especially after sexual intercourse or during menstruation.  Itching in and around the vagina.  Burning or pain with urination.  Some women with bacterial vaginosis have no signs or symptoms. How is this diagnosed? This condition is diagnosed based on:  Your medical history.  A physical exam of the vagina.  Testing a sample of vaginal fluid under a microscope to look for a large amount of bad bacteria or abnormal cells. Your health care provider may use a cotton swab  or a small wooden spatula to collect the sample.  How is this treated? This condition is treated with antibiotics. These may be given as a pill, a vaginal cream, or a medicine that is put into the vagina (suppository). If the condition comes back after treatment, a second round of antibiotics may be needed. Follow these instructions at home: Medicines  Take over-the-counter and prescription medicines only as told by your health care provider.  Take or use your antibiotic as told by your health care provider. Do not stop taking or using the antibiotic even if you start to feel better. General instructions  If you have a female sexual partner, tell her that you have a vaginal infection. She should see her health care provider and be treated if she has symptoms. If you have a female sexual partner, he does not need treatment.  During treatment: ? Avoid sexual activity until you finish treatment. ? Do not douche. ? Avoid alcohol as directed by your health care provider. ? Avoid breastfeeding as directed by your health care provider.  Drink enough water and fluids to keep your urine clear or pale yellow.  Keep the area around your vagina and rectum clean. ? Wash the area daily with warm water. ? Wipe yourself from front to back after using the toilet.  Keep all follow-up visits as told by your health care provider. This is important. How is this prevented?  Do not douche.  Wash the outside of your vagina with warm water only.  Use protection when having sex. This includes latex condoms and dental dams.  Limit how many sexual partners you have. To help prevent bacterial vaginosis, it is best to have sex with just  one partner (monogamous). °· Make sure you and your sexual partner are tested for STIs. °· Wear cotton or cotton-lined underwear. °· Avoid wearing tight pants and pantyhose, especially during summer. °· Limit the amount of alcohol that you drink. °· Do not use any products that  contain nicotine or tobacco, such as cigarettes and e-cigarettes. If you need help quitting, ask your health care provider. °· Do not use illegal drugs. °Where to find more information: °· Centers for Disease Control and Prevention: www.cdc.gov/std °· American Sexual Health Association (ASHA): www.ashastd.org °· U.S. Department of Health and Human Services, Office on Women's Health: www.womenshealth.gov/ or https://www.womenshealth.gov/a-z-topics/bacterial-vaginosis °Contact a health care provider if: °· Your symptoms do not improve, even after treatment. °· You have more discharge or pain when urinating. °· You have a fever. °· You have pain in your abdomen. °· You have pain during sex. °· You have vaginal bleeding between periods. °Summary °· Bacterial vaginosis is a vaginal infection that occurs when the normal balance of bacteria in the vagina is disrupted. °· Because bacterial vaginosis increases your risk for STIs (sexually transmitted infections), getting treated can help reduce your risk for chlamydia, gonorrhea, herpes, and HIV (human immunodeficiency virus). Treatment is also important for preventing complications in pregnant women, because the condition can cause an early (premature) delivery. °· This condition is treated with antibiotic medicines. These may be given as a pill, a vaginal cream, or a medicine that is put into the vagina (suppository). °This information is not intended to replace advice given to you by your health care provider. Make sure you discuss any questions you have with your health care provider. °Document Released: 08/15/2005 Document Revised: 12/19/2016 Document Reviewed: 04/30/2016 °Elsevier Interactive Patient Education © 2018 Elsevier Inc. ° °Pregnancy and Urinary Tract Infection °What is a urinary tract infection? °A urinary tract infection (UTI) is an infection of any part of the urinary tract. This includes the kidneys, the tubes that connect your kidneys to your bladder  (ureters), the bladder, and the tube that carries urine out of your body (urethra). These organs make, store, and get rid of urine in the body. A UTI can be a bladder infection (cystitis) or a kidney infection (pyelonephritis). This infection may be caused by fungi, viruses, and bacteria. Bacteria are the most common cause of UTIs. °You are more likely to develop a UTI during pregnancy because: °· The physical and hormonal changes your body goes through can make it easier for bacteria to get into your urinary tract. °· Your growing baby puts pressure on your uterus and can affect urine flow. ° °Does a UTI place my baby at risk? °An untreated UTI during pregnancy could lead to a kidney infection, which can cause health problems that could affect your baby. Possible complications of an untreated UTI include: °· Having your baby before 37 weeks of pregnancy (premature). °· Having a baby with a low birth weight. °· Developing high blood pressure during pregnancy (preeclampsia). ° °What are the symptoms of a UTI? °Symptoms of a UTI include: °· Fever. °· Frequent urination or passing small amounts of urine frequently. °· Needing to urinate urgently. °· Pain or a burning sensation with urination. °· Urine that smells bad or unusual. °· Cloudy urine. °· Pain in the lower abdomen or back. °· Trouble urinating. °· Blood in the urine. °· Vomiting or being less hungry than normal. °· Diarrhea or abdominal pain. °· Vaginal discharge. ° °What are the treatment options for a UTI during pregnancy? °Treatment   for this condition may include:  Antibiotic medicines that are safe to take during pregnancy.  Other medicines to treat less common causes of UTI.  How can I prevent a UTI?  To prevent a UTI:  Go to the bathroom as soon as you feel the need.  Always wipe from front to back.  Wash your genital area with soap and warm water daily.  Empty your bladder before and after sex.  Wear cotton underwear.  Limit your  intake of high sugar foods or drinks, such as regular soda, juice, and sweets.  Drink 6-8 glasses of water daily.  Do not wear tight-fitting pants.  Do not douche or use deodorant sprays.  Do not drink alcohol, caffeine, or carbonated drinks. These can irritate the bladder.  Contact a health care provider if:  Your symptoms do not improve or get worse.  You have a fever after two days of treatment.  You have a rash.  You have abnormal vaginal discharge.  You have back or side pain.  You have chills.  You have nausea and vomiting. Get help right away if: Seek immediate medical care if you are pregnant and:  You feel contractions in your uterus.  You have lower belly pain.  You have a gush of fluid from your vagina.  You have blood in your urine.  You are vomiting and cannot keep down any medicines or water.  This information is not intended to replace advice given to you by your health care provider. Make sure you discuss any questions you have with your health care provider. Document Released: 12/10/2010 Document Revised: 07/29/2016 Document Reviewed: 07/06/2015 Elsevier Interactive Patient Education  2017 [employer]

## 2018-03-24 LAB — RPR: RPR: NONREACTIVE

## 2018-03-24 LAB — HIV ANTIBODY (ROUTINE TESTING W REFLEX): HIV SCREEN 4TH GENERATION: NONREACTIVE

## 2018-03-26 LAB — GC/CHLAMYDIA PROBE AMP (~~LOC~~) NOT AT ARMC
Chlamydia: NEGATIVE
NEISSERIA GONORRHEA: NEGATIVE

## 2018-04-09 ENCOUNTER — Encounter: Payer: Medicaid Other | Admitting: Obstetrics

## 2018-04-10 ENCOUNTER — Encounter: Payer: Self-pay | Admitting: Obstetrics and Gynecology

## 2018-04-10 ENCOUNTER — Ambulatory Visit (INDEPENDENT_AMBULATORY_CARE_PROVIDER_SITE_OTHER): Payer: Medicaid Other | Admitting: Obstetrics and Gynecology

## 2018-04-10 ENCOUNTER — Other Ambulatory Visit (HOSPITAL_COMMUNITY)
Admission: RE | Admit: 2018-04-10 | Discharge: 2018-04-10 | Disposition: A | Discharge status: Home / Self Care | Payer: Medicaid Other | Source: Ambulatory Visit | Attending: Obstetrics and Gynecology | Admitting: Obstetrics and Gynecology

## 2018-04-10 VITALS — BP 128/72 | HR 105 | Wt 205.0 lb

## 2018-04-10 DIAGNOSIS — Z8632 Personal history of gestational diabetes: Secondary | ICD-10-CM

## 2018-04-10 DIAGNOSIS — O0930 Supervision of pregnancy with insufficient antenatal care, unspecified trimester: Secondary | ICD-10-CM | POA: Insufficient documentation

## 2018-04-10 DIAGNOSIS — Z6791 Unspecified blood type, Rh negative: Secondary | ICD-10-CM

## 2018-04-10 DIAGNOSIS — O0990 Supervision of high risk pregnancy, unspecified, unspecified trimester: Secondary | ICD-10-CM | POA: Diagnosis present

## 2018-04-10 DIAGNOSIS — Z3480 Encounter for supervision of other normal pregnancy, unspecified trimester: Secondary | ICD-10-CM

## 2018-04-10 DIAGNOSIS — O0932 Supervision of pregnancy with insufficient antenatal care, second trimester: Secondary | ICD-10-CM | POA: Diagnosis not present

## 2018-04-10 DIAGNOSIS — Z3A25 25 weeks gestation of pregnancy: Secondary | ICD-10-CM | POA: Insufficient documentation

## 2018-04-10 DIAGNOSIS — O360920 Maternal care for other rhesus isoimmunization, second trimester, not applicable or unspecified: Secondary | ICD-10-CM

## 2018-04-10 DIAGNOSIS — O99210 Obesity complicating pregnancy, unspecified trimester: Secondary | ICD-10-CM

## 2018-04-10 DIAGNOSIS — O0992 Supervision of high risk pregnancy, unspecified, second trimester: Secondary | ICD-10-CM

## 2018-04-10 DIAGNOSIS — O09299 Supervision of pregnancy with other poor reproductive or obstetric history, unspecified trimester: Secondary | ICD-10-CM | POA: Insufficient documentation

## 2018-04-10 DIAGNOSIS — O26899 Other specified pregnancy related conditions, unspecified trimester: Secondary | ICD-10-CM

## 2018-04-10 DIAGNOSIS — O09292 Supervision of pregnancy with other poor reproductive or obstetric history, second trimester: Secondary | ICD-10-CM

## 2018-04-10 DIAGNOSIS — O99212 Obesity complicating pregnancy, second trimester: Secondary | ICD-10-CM

## 2018-04-10 NOTE — Patient Instructions (Signed)
 Second Trimester of Pregnancy The second trimester is from week 14 through week 27 (months 4 through 6). The second trimester is often a time when you feel your best. Your body has adjusted to being pregnant, and you begin to feel better physically. Usually, morning sickness has lessened or quit completely, you may have more energy, and you may have an increase in appetite. The second trimester is also a time when the fetus is growing rapidly. At the end of the sixth month, the fetus is about 9 inches long and weighs about 1 pounds. You will likely begin to feel the baby move (quickening) between 16 and 20 weeks of pregnancy. Body changes during your second trimester Your body continues to go through many changes during your second trimester. The changes vary from woman to woman.  Your weight will continue to increase. You will notice your lower abdomen bulging out.  You may begin to get stretch marks on your hips, abdomen, and breasts.  You may develop headaches that can be relieved by medicines. The medicines should be approved by your health care provider.  You may urinate more often because the fetus is pressing on your bladder.  You may develop or continue to have heartburn as a result of your pregnancy.  You may develop constipation because certain hormones are causing the muscles that push waste through your intestines to slow down.  You may develop hemorrhoids or swollen, bulging veins (varicose veins).  You may have back pain. This is caused by: ? Weight gain. ? Pregnancy hormones that are relaxing the joints in your pelvis. ? A shift in weight and the muscles that support your balance.  Your breasts will continue to grow and they will continue to become tender.  Your gums may bleed and may be sensitive to brushing and flossing.  Dark spots or blotches (chloasma, mask of pregnancy) may develop on your face. This will likely fade after the baby is born.  A dark line from  your belly button to the pubic area (linea nigra) may appear. This will likely fade after the baby is born.  You may have changes in your hair. These can include thickening of your hair, rapid growth, and changes in texture. Some women also have hair loss during or after pregnancy, or hair that feels dry or thin. Your hair will most likely return to normal after your baby is born.  What to expect at prenatal visits During a routine prenatal visit:  You will be weighed to make sure you and the fetus are growing normally.  Your blood pressure will be taken.  Your abdomen will be measured to track your baby's growth.  The fetal heartbeat will be listened to.  Any test results from the previous visit will be discussed.  Your health care provider may ask you:  How you are feeling.  If you are feeling the baby move.  If you have had any abnormal symptoms, such as leaking fluid, bleeding, severe headaches, or abdominal cramping.  If you are using any tobacco products, including cigarettes, chewing tobacco, and electronic cigarettes.  If you have any questions.  Other tests that may be performed during your second trimester include:  Blood tests that check for: ? Low iron levels (anemia). ? High blood sugar that affects pregnant women (gestational diabetes) between 24 and 28 weeks. ? Rh antibodies. This is to check for a protein on red blood cells (Rh factor).  Urine tests to check for infections, diabetes,   or protein in the urine.  An ultrasound to confirm the proper growth and development of the baby.  An amniocentesis to check for possible genetic problems.  Fetal screens for spina bifida and Down syndrome.  HIV (human immunodeficiency virus) testing. Routine prenatal testing includes screening for HIV, unless you choose not to have this test.  Follow these instructions at home: Medicines  Follow your health care provider's instructions regarding medicine use. Specific  medicines may be either safe or unsafe to take during pregnancy.  Take a prenatal vitamin that contains at least 600 micrograms (mcg) of folic acid.  If you develop constipation, try taking a stool softener if your health care provider approves. Eating and drinking  Eat a balanced diet that includes fresh fruits and vegetables, whole grains, good sources of protein such as meat, eggs, or tofu, and low-fat dairy. Your health care provider will help you determine the amount of weight gain that is right for you.  Avoid raw meat and uncooked cheese. These carry germs that can cause birth defects in the baby.  If you have low calcium intake from food, talk to your health care provider about whether you should take a daily calcium supplement.  Limit foods that are high in fat and processed sugars, such as fried and sweet foods.  To prevent constipation: ? Drink enough fluid to keep your urine clear or pale yellow. ? Eat foods that are high in fiber, such as fresh fruits and vegetables, whole grains, and beans. Activity  Exercise only as directed by your health care provider. Most women can continue their usual exercise routine during pregnancy. Try to exercise for 30 minutes at least 5 days a week. Stop exercising if you experience uterine contractions.  Avoid heavy lifting, wear low heel shoes, and practice good posture.  A sexual relationship may be continued unless your health care provider directs you otherwise. Relieving pain and discomfort  Wear a good support bra to prevent discomfort from breast tenderness.  Take warm sitz baths to soothe any pain or discomfort caused by hemorrhoids. Use hemorrhoid cream if your health care provider approves.  Rest with your legs elevated if you have leg cramps or low back pain.  If you develop varicose veins, wear support hose. Elevate your feet for 15 minutes, 3-4 times a day. Limit salt in your diet. Prenatal Care  Write down your questions.  Take them to your prenatal visits.  Keep all your prenatal visits as told by your health care provider. This is important. Safety  Wear your seat belt at all times when driving.  Make a list of emergency phone numbers, including numbers for family, friends, the hospital, and police and fire departments. General instructions  Ask your health care provider for a referral to a local prenatal education class. Begin classes no later than the beginning of month 6 of your pregnancy.  Ask for help if you have counseling or nutritional needs during pregnancy. Your health care provider can offer advice or refer you to specialists for help with various needs.  Do not use hot tubs, steam rooms, or saunas.  Do not douche or use tampons or scented sanitary pads.  Do not cross your legs for long periods of time.  Avoid cat litter boxes and soil used by cats. These carry germs that can cause birth defects in the baby and possibly loss of the fetus by miscarriage or stillbirth.  Avoid all smoking, herbs, alcohol, and unprescribed drugs. Chemicals in these products   can affect the formation and growth of the baby.  Do not use any products that contain nicotine or tobacco, such as cigarettes and e-cigarettes. If you need help quitting, ask your health care provider.  Visit your dentist if you have not gone yet during your pregnancy. Use a soft toothbrush to brush your teeth and be gentle when you floss. Contact a health care provider if:  You have dizziness.  You have mild pelvic cramps, pelvic pressure, or nagging pain in the abdominal area.  You have persistent nausea, vomiting, or diarrhea.  You have a bad smelling vaginal discharge.  You have pain when you urinate. Get help right away if:  You have a fever.  You are leaking fluid from your vagina.  You have spotting or bleeding from your vagina.  You have severe abdominal cramping or pain.  You have rapid weight gain or weight  loss.  You have shortness of breath with chest pain.  You notice sudden or extreme swelling of your face, hands, ankles, feet, or legs.  You have not felt your baby move in over an hour.  You have severe headaches that do not go away when you take medicine.  You have vision changes. Summary  The second trimester is from week 14 through week 27 (months 4 through 6). It is also a time when the fetus is growing rapidly.  Your body goes through many changes during pregnancy. The changes vary from woman to woman.  Avoid all smoking, herbs, alcohol, and unprescribed drugs. These chemicals affect the formation and growth your baby.  Do not use any tobacco products, such as cigarettes, chewing tobacco, and e-cigarettes. If you need help quitting, ask your health care provider.  Contact your health care provider if you have any questions. Keep all prenatal visits as told by your health care provider. This is important. This information is not intended to replace advice given to you by your health care provider. Make sure you discuss any questions you have with your health care provider. Document Released: 08/09/2001 Document Revised: 09/20/2016 Document Reviewed: 09/20/2016 [employer] Interactive Patient Education  2018 [employer]   Contraception Choices Contraception, also called birth control, refers to methods or devices that prevent pregnancy. Hormonal methods Contraceptive implant A contraceptive implant is a thin, plastic tube that contains a hormone. It is inserted into the upper part of the arm. It can remain in place for up to 3 years. Progestin-only injections Progestin-only injections are injections of progestin, a synthetic form of the hormone progesterone. They are given every 3 months by a health care provider. Birth control pills Birth control pills are pills that contain hormones that prevent pregnancy. They must be taken once a day, preferably at the same time each  day. Birth control patch The birth control patch contains hormones that prevent pregnancy. It is placed on the skin and must be changed once a week for three weeks and removed on the fourth week. A prescription is needed to use this method of contraception. Vaginal ring A vaginal ring contains hormones that prevent pregnancy. It is placed in the vagina for three weeks and removed on the fourth week. After that, the process is repeated with a new ring. A prescription is needed to use this method of contraception. Emergency contraceptive Emergency contraceptives prevent pregnancy after unprotected sex. They come in pill form and can be taken up to 5 days after sex. They work best the sooner they are taken after having sex. Most emergency   contraceptives are available without a prescription. This method should not be used as your only form of birth control. Barrier methods Female condom A female condom is a thin sheath that is worn over the penis during sex. Condoms keep sperm from going inside a woman's body. They can be used with a spermicide to increase their effectiveness. They should be disposed after a single use. Female condom A female condom is a soft, loose-fitting sheath that is put into the vagina before sex. The condom keeps sperm from going inside a woman's body. They should be disposed after a single use. Diaphragm A diaphragm is a soft, dome-shaped barrier. It is inserted into the vagina before sex, along with a spermicide. The diaphragm blocks sperm from entering the uterus, and the spermicide kills sperm. A diaphragm should be left in the vagina for 6-8 hours after sex and removed within 24 hours. A diaphragm is prescribed and fitted by a health care provider. A diaphragm should be replaced every 1-2 years, after giving birth, after gaining more than 15 lb (6.8 kg), and after pelvic surgery. Cervical cap A cervical cap is a round, soft latex or plastic cup that fits over the cervix. It is  inserted into the vagina before sex, along with spermicide. It blocks sperm from entering the uterus. The cap should be left in place for 6-8 hours after sex and removed within 48 hours. A cervical cap must be prescribed and fitted by a health care provider. It should be replaced every 2 years. Sponge A sponge is a soft, circular piece of polyurethane foam with spermicide on it. The sponge helps block sperm from entering the uterus, and the spermicide kills sperm. To use it, you make it wet and then insert it into the vagina. It should be inserted before sex, left in for at least 6 hours after sex, and removed and thrown away within 30 hours. Spermicides Spermicides are chemicals that kill or block sperm from entering the cervix and uterus. They can come as a cream, jelly, suppository, foam, or tablet. A spermicide should be inserted into the vagina with an applicator at least 10-15 minutes before sex to allow time for it to work. The process must be repeated every time you have sex. Spermicides do not require a prescription. Intrauterine contraception Intrauterine device (IUD) An IUD is a T-shaped device that is put in a woman's uterus. There are two types:  Hormone IUD.This type contains progestin, a synthetic form of the hormone progesterone. This type can stay in place for 3-5 years.  Copper IUD.This type is wrapped in copper wire. It can stay in place for 10 years.  Permanent methods of contraception Female tubal ligation In this method, a woman's fallopian tubes are sealed, tied, or blocked during surgery to prevent eggs from traveling to the uterus. Hysteroscopic sterilization In this method, a small, flexible insert is placed into each fallopian tube. The inserts cause scar tissue to form in the fallopian tubes and block them, so sperm cannot reach an egg. The procedure takes about 3 months to be effective. Another form of birth control must be used during those 3 months. Female  sterilization This is a procedure to tie off the tubes that carry sperm (vasectomy). After the procedure, the man can still ejaculate fluid (semen). Natural planning methods Natural family planning In this method, a couple does not have sex on days when the woman could become pregnant. Calendar method This means keeping track of the length of each   menstrual cycle, identifying the days when pregnancy can happen, and not having sex on those days. Ovulation method In this method, a couple avoids sex during ovulation. Symptothermal method This method involves not having sex during ovulation. The woman typically checks for ovulation by watching changes in her temperature and in the consistency of cervical mucus. Post-ovulation method In this method, a couple waits to have sex until after ovulation. Summary  Contraception, also called birth control, means methods or devices that prevent pregnancy.  Hormonal methods of contraception include implants, injections, pills, patches, vaginal rings, and emergency contraceptives.  Barrier methods of contraception can include female condoms, female condoms, diaphragms, cervical caps, sponges, and spermicides.  There are two types of IUDs (intrauterine devices). An IUD can be put in a woman's uterus to prevent pregnancy for 3-5 years.  Permanent sterilization can be done through a procedure for males, females, or both.  Natural family planning methods involve not having sex on days when the woman could become pregnant. This information is not intended to replace advice given to you by your health care provider. Make sure you discuss any questions you have with your health care provider. Document Released: 08/15/2005 Document Revised: 09/17/2016 Document Reviewed: 09/17/2016 [employer] Interactive Patient Education  2018 [employer]   Breastfeeding Choosing to breastfeed is one of the best decisions you can make for yourself and your baby. A change in  hormones during pregnancy causes your breasts to make breast milk in your milk-producing glands. Hormones prevent breast milk from being released before your baby is born. They also prompt milk flow after birth. Once breastfeeding has begun, thoughts of your baby, as well as his or her sucking or crying, can stimulate the release of milk from your milk-producing glands. Benefits of breastfeeding Research shows that breastfeeding offers many health benefits for infants and mothers. It also offers a cost-free and convenient way to feed your baby. For your baby  Your first milk (colostrum) helps your baby's digestive system to function better.  Special cells in your milk (antibodies) help your baby to fight off infections.  Breastfed babies are less likely to develop asthma, allergies, obesity, or type 2 diabetes. They are also at lower risk for sudden infant death syndrome (SIDS).  Nutrients in breast milk are better able to meet your baby's needs compared to infant formula.  Breast milk improves your baby's brain development. For you  Breastfeeding helps to create a very special bond between you and your baby.  Breastfeeding is convenient. Breast milk costs nothing and is always available at the correct temperature.  Breastfeeding helps to burn calories. It helps you to lose the weight that you gained during pregnancy.  Breastfeeding makes your uterus return faster to its size before pregnancy. It also slows bleeding (lochia) after you give birth.  Breastfeeding helps to lower your risk of developing type 2 diabetes, osteoporosis, rheumatoid arthritis, cardiovascular disease, and breast, ovarian, uterine, and endometrial cancer later in life. Breastfeeding basics Starting breastfeeding  Find a comfortable place to sit or lie down, with your neck and back well-supported.  Place a pillow or a rolled-up blanket under your baby to bring him or her to the level of your breast (if you are  seated). Nursing pillows are specially designed to help support your arms and your baby while you breastfeed.  Make sure that your baby's tummy (abdomen) is facing your abdomen.  Gently massage your breast. With your fingertips, massage from the outer edges of your breast inward   toward the nipple. This encourages milk flow. If your milk flows slowly, you may need to continue this action during the feeding.  Support your breast with 4 fingers underneath and your thumb above your nipple (make the letter "C" with your hand). Make sure your fingers are well away from your nipple and your baby's mouth.  Stroke your baby's lips gently with your finger or nipple.  When your baby's mouth is open wide enough, quickly bring your baby to your breast, placing your entire nipple and as much of the areola as possible into your baby's mouth. The areola is the colored area around your nipple. ? More areola should be visible above your baby's upper lip than below the lower lip. ? Your baby's lips should be opened and extended outward (flanged) to ensure an adequate, comfortable latch. ? Your baby's tongue should be between his or her lower gum and your breast.  Make sure that your baby's mouth is correctly positioned around your nipple (latched). Your baby's lips should create a seal on your breast and be turned out (everted).  It is common for your baby to suck about 2-3 minutes in order to start the flow of breast milk. Latching Teaching your baby how to latch onto your breast properly is very important. An improper latch can cause nipple pain, decreased milk supply, and poor weight gain in your baby. Also, if your baby is not latched onto your nipple properly, he or she may swallow some air during feeding. This can make your baby fussy. Burping your baby when you switch breasts during the feeding can help to get rid of the air. However, teaching your baby to latch on properly is still the best way to prevent  fussiness from swallowing air while breastfeeding. Signs that your baby has successfully latched onto your nipple  Silent tugging or silent sucking, without causing you pain. Infant's lips should be extended outward (flanged).  Swallowing heard between every 3-4 sucks once your milk has started to flow (after your let-down milk reflex occurs).  Muscle movement above and in front of his or her ears while sucking.  Signs that your baby has not successfully latched onto your nipple  Sucking sounds or smacking sounds from your baby while breastfeeding.  Nipple pain.  If you think your baby has not latched on correctly, slip your finger into the corner of your baby's mouth to break the suction and place it between your baby's gums. Attempt to start breastfeeding again. Signs of successful breastfeeding Signs from your baby  Your baby will gradually decrease the number of sucks or will completely stop sucking.  Your baby will fall asleep.  Your baby's body will relax.  Your baby will retain a small amount of milk in his or her mouth.  Your baby will let go of your breast by himself or herself.  Signs from you  Breasts that have increased in firmness, weight, and size 1-3 hours after feeding.  Breasts that are softer immediately after breastfeeding.  Increased milk volume, as well as a change in milk consistency and color by the fifth day of breastfeeding.  Nipples that are not sore, cracked, or bleeding.  Signs that your baby is getting enough milk  Wetting at least 1-2 diapers during the first 24 hours after birth.  Wetting at least 5-6 diapers every 24 hours for the first week after birth. The urine should be clear or pale yellow by the age of 5 days.  Wetting 6-8   diapers every 24 hours as your baby continues to grow and develop.  At least 3 stools in a 24-hour period by the age of 5 days. The stool should be soft and yellow.  At least 3 stools in a 24-hour period by the  age of 7 days. The stool should be seedy and yellow.  No loss of weight greater than 10% of birth weight during the first 3 days of life.  Average weight gain of 4-7 oz (113-198 g) per week after the age of 4 days.  Consistent daily weight gain by the age of 5 days, without weight loss after the age of 2 weeks. After a feeding, your baby may spit up a small amount of milk. This is normal. Breastfeeding frequency and duration Frequent feeding will help you make more milk and can prevent sore nipples and extremely full breasts (breast engorgement). Breastfeed when you feel the need to reduce the fullness of your breasts or when your baby shows signs of hunger. This is called "breastfeeding on demand." Signs that your baby is hungry include:  Increased alertness, activity, or restlessness.  Movement of the head from side to side.  Opening of the mouth when the corner of the mouth or cheek is stroked (rooting).  Increased sucking sounds, smacking lips, cooing, sighing, or squeaking.  Hand-to-mouth movements and sucking on fingers or hands.  Fussing or crying.  Avoid introducing a pacifier to your baby in the first 4-6 weeks after your baby is born. After this time, you may choose to use a pacifier. Research has shown that pacifier use during the first year of a baby's life decreases the risk of sudden infant death syndrome (SIDS). Allow your baby to feed on each breast as long as he or she wants. When your baby unlatches or falls asleep while feeding from the first breast, offer the second breast. Because newborns are often sleepy in the first few weeks of life, you may need to awaken your baby to get him or her to feed. Breastfeeding times will vary from baby to baby. However, the following rules can serve as a guide to help you make sure that your baby is properly fed:  Newborns (babies 4 weeks of age or younger) may breastfeed every 1-3 hours.  Newborns should not go without breastfeeding  for longer than 3 hours during the day or 5 hours during the night.  You should breastfeed your baby a minimum of 8 times in a 24-hour period.  Breast milk pumping Pumping and storing breast milk allows you to make sure that your baby is exclusively fed your breast milk, even at times when you are unable to breastfeed. This is especially important if you go back to work while you are still breastfeeding, or if you are not able to be present during feedings. Your lactation consultant can help you find a method of pumping that works best for you and give you guidelines about how long it is safe to store breast milk. Caring for your breasts while you breastfeed Nipples can become dry, cracked, and sore while breastfeeding. The following recommendations can help keep your breasts moisturized and healthy:  Avoid using soap on your nipples.  Wear a supportive bra designed especially for nursing. Avoid wearing underwire-style bras or extremely tight bras (sports bras).  Air-dry your nipples for 3-4 minutes after each feeding.  Use only cotton bra pads to absorb leaked breast milk. Leaking of breast milk between feedings is normal.  Use lanolin   on your nipples after breastfeeding. Lanolin helps to maintain your skin's normal moisture barrier. Pure lanolin is not harmful (not toxic) to your baby. You may also hand express a few drops of breast milk and gently massage that milk into your nipples and allow the milk to air-dry.  In the first few weeks after giving birth, some women experience breast engorgement. Engorgement can make your breasts feel heavy, warm, and tender to the touch. Engorgement peaks within 3-5 days after you give birth. The following recommendations can help to ease engorgement:  Completely empty your breasts while breastfeeding or pumping. You may want to start by applying warm, moist heat (in the shower or with warm, water-soaked hand towels) just before feeding or pumping. This  increases circulation and helps the milk flow. If your baby does not completely empty your breasts while breastfeeding, pump any extra milk after he or she is finished.  Apply ice packs to your breasts immediately after breastfeeding or pumping, unless this is too uncomfortable for you. To do this: ? Put ice in a plastic bag. ? Place a towel between your skin and the bag. ? Leave the ice on for 20 minutes, 2-3 times a day.  Make sure that your baby is latched on and positioned properly while breastfeeding.  If engorgement persists after 48 hours of following these recommendations, contact your health care provider or a lactation consultant. Overall health care recommendations while breastfeeding  Eat 3 healthy meals and 3 snacks every day. Well-nourished mothers who are breastfeeding need an additional 450-500 calories a day. You can meet this requirement by increasing the amount of a balanced diet that you eat.  Drink enough water to keep your urine pale yellow or clear.  Rest often, relax, and continue to take your prenatal vitamins to prevent fatigue, stress, and low vitamin and mineral levels in your body (nutrient deficiencies).  Do not use any products that contain nicotine or tobacco, such as cigarettes and e-cigarettes. Your baby may be harmed by chemicals from cigarettes that pass into breast milk and exposure to secondhand smoke. If you need help quitting, ask your health care provider.  Avoid alcohol.  Do not use illegal drugs or marijuana.  Talk with your health care provider before taking any medicines. These include over-the-counter and prescription medicines as well as vitamins and herbal supplements. Some medicines that may be harmful to your baby can pass through breast milk.  It is possible to become pregnant while breastfeeding. If birth control is desired, ask your health care provider about options that will be safe while breastfeeding your baby. Where to find more  information: [employer]: www.llli.org Contact a health care provider if:  You feel like you want to stop breastfeeding or have become frustrated with breastfeeding.  Your nipples are cracked or bleeding.  Your breasts are red, tender, or warm.  You have: ? Painful breasts or nipples. ? A swollen area on either breast. ? A fever or chills. ? Nausea or vomiting. ? Drainage other than breast milk from your nipples.  Your breasts do not become full before feedings by the fifth day after you give birth.  You feel sad and depressed.  Your baby is: ? Too sleepy to eat well. ? Having trouble sleeping. ? More than 1 week old and wetting fewer than 6 diapers in a 24-hour period. ? Not gaining weight by 5 days of age.  Your baby has fewer than 3 stools in a 24-hour period.    Your baby's skin or the white parts of his or her eyes become yellow. Get help right away if:  Your baby is overly tired (lethargic) and does not want to wake up and feed.  Your baby develops an unexplained fever. Summary  Breastfeeding offers many health benefits for infant and mothers.  Try to breastfeed your infant when he or she shows early signs of hunger.  Gently tickle or stroke your baby's lips with your finger or nipple to allow the baby to open his or her mouth. Bring the baby to your breast. Make sure that much of the areola is in your baby's mouth. Offer one side and burp the baby before you offer the other side.  Talk with your health care provider or lactation consultant if you have questions or you face problems as you breastfeed. This information is not intended to replace advice given to you by your health care provider. Make sure you discuss any questions you have with your health care provider. Document Released: 08/15/2005 Document Revised: 09/16/2016 Document Reviewed: 09/16/2016 Elsevier Interactive Patient Education  2018 Elsevier Inc.  

## 2018-04-10 NOTE — Progress Notes (Signed)
  Subjective:    Annette Juarez is a G2P1000 1653w2d being seen today for her first obstetrical visit.  Her obstetrical history is significant for obesity, history of GDM, late onset to care. Pregnancy history fully reviewed.  Patient reports no complaints.  Vitals:   04/10/18 1031  BP: 128/72  Pulse: (!) 105  Weight: 205 lb (93 kg)    HISTORY: OB History  Gravida Para Term Preterm AB Living  2 1 1      0  SAB TAB Ectopic Multiple Live Births               # Outcome Date GA Lbr Len/2nd Weight Sex Delivery Anes PTL Lv  2 Current           1 Term 11/15/16 1254w2d    Vag-Spont      Past Medical History:  Diagnosis Date  . Diabetes mellitus without complication (HCC)    Gestational diet control  . Gestational diabetes    Past Surgical History:  Procedure Laterality Date  . NO PAST SURGERIES     Family History  Problem Relation Age of Onset  . Hypertension Mother   . Diabetes Father   . Asthma Sister   . Diabetes Maternal Grandmother   . Hypertension Maternal Grandmother   . Hypertension Maternal Grandfather      Exam    Uterus:     Pelvic Exam:    Perineum: No Hemorrhoids, Normal Perineum   Vulva: normal   Vagina:  normal mucosa, normal discharge   pH:    Cervix: Multiparous and cervix is closed and long   Adnexa: normal adnexa and no mass, fullness, tenderness   Bony Pelvis: gynecoid  System: Breast:  normal appearance, no masses or tenderness   Skin: normal coloration and turgor, no rashes    Neurologic: oriented, no focal deficits   Extremities: normal strength, tone, and muscle mass   HEENT extra ocular movement intact   Mouth/Teeth mucous membranes moist, pharynx normal without lesions and dental hygiene good   Neck supple and no masses   Cardiovascular: regular rate and rhythm   Respiratory:  appears well, vitals normal, no respiratory distress, acyanotic, normal RR, chest clear, no wheezing, crepitations, rhonchi, normal symmetric air entry   Abdomen:  soft, non-tender; bowel sounds normal; no masses,  no organomegaly   Urinary:       Assessment:    Pregnancy: G2P1000 Patient Active Problem List   Diagnosis Date Noted  . Supervision of other normal pregnancy, antepartum 04/10/2018  . Late prenatal care affecting pregnancy 04/10/2018  . History of gestational diabetes in prior pregnancy, currently pregnant 04/10/2018  . Normal labor 11/14/2016  . Rh negative, antepartum 05/18/2016  . Susceptible to varicella (non-immune), currently pregnant 05/12/2016  . Obesity affecting pregnancy, antepartum 05/11/2016        Plan:     Initial labs drawn. Prenatal vitamins. Problem list reviewed and updated. Genetic Screening discussed : panorama ordered.  Ultrasound discussed; fetal survey: ordered.  Follow up in 2 weeks for ROB and 2 hour glucola Patient with history of GDM. Advised patient to resume diabetic diet  Patient became frustrated at the end of the visit as she desired to know fetal gender today. Patient left office very upset 50% of 30 min visit spent on counseling and coordination of care.     Annette Juarez 04/10/2018

## 2018-04-11 LAB — CYTOLOGY - PAP
CHLAMYDIA, DNA PROBE: NEGATIVE
Diagnosis: NEGATIVE
Neisseria Gonorrhea: NEGATIVE

## 2018-04-12 LAB — CULTURE, OB URINE

## 2018-04-12 LAB — URINE CULTURE, OB REFLEX

## 2018-04-13 ENCOUNTER — Telehealth: Payer: Self-pay

## 2018-04-13 NOTE — Telephone Encounter (Signed)
TC to pt regarding Genetic Screening results from 04/10/18 Pt not ava Results are not back yet however specimens were received and are processing per [facility] site may be completed 04/16/18-04/18/18.

## 2018-04-16 ENCOUNTER — Other Ambulatory Visit: Payer: Self-pay

## 2018-04-17 ENCOUNTER — Other Ambulatory Visit: Payer: Self-pay | Admitting: Obstetrics and Gynecology

## 2018-04-17 ENCOUNTER — Ambulatory Visit (HOSPITAL_COMMUNITY)
Admission: RE | Admit: 2018-04-17 | Discharge: 2018-04-17 | Disposition: A | Discharge status: Home / Self Care | Payer: Medicaid Other | Source: Ambulatory Visit | Attending: Advanced Practice Midwife | Admitting: Advanced Practice Midwife

## 2018-04-17 ENCOUNTER — Encounter: Payer: Self-pay | Admitting: *Deleted

## 2018-04-17 DIAGNOSIS — Z3A26 26 weeks gestation of pregnancy: Secondary | ICD-10-CM

## 2018-04-17 DIAGNOSIS — O0990 Supervision of high risk pregnancy, unspecified, unspecified trimester: Secondary | ICD-10-CM

## 2018-04-17 DIAGNOSIS — O0992 Supervision of high risk pregnancy, unspecified, second trimester: Secondary | ICD-10-CM | POA: Diagnosis not present

## 2018-04-17 DIAGNOSIS — O321XX0 Maternal care for breech presentation, not applicable or unspecified: Secondary | ICD-10-CM | POA: Insufficient documentation

## 2018-04-17 DIAGNOSIS — Z8632 Personal history of gestational diabetes: Secondary | ICD-10-CM | POA: Diagnosis not present

## 2018-04-17 DIAGNOSIS — Z363 Encounter for antenatal screening for malformations: Secondary | ICD-10-CM

## 2018-04-17 DIAGNOSIS — O99212 Obesity complicating pregnancy, second trimester: Secondary | ICD-10-CM

## 2018-04-17 DIAGNOSIS — O360120 Maternal care for anti-D [Rh] antibodies, second trimester, not applicable or unspecified: Secondary | ICD-10-CM

## 2018-04-17 DIAGNOSIS — O0932 Supervision of pregnancy with insufficient antenatal care, second trimester: Secondary | ICD-10-CM | POA: Diagnosis not present

## 2018-04-19 LAB — OBSTETRIC PANEL, INCLUDING HIV
ANTIBODY SCREEN: NEGATIVE
BASOS: 1 %
Basophils Absolute: 0 10*3/uL (ref 0.0–0.2)
EOS (ABSOLUTE): 0.2 10*3/uL (ref 0.0–0.4)
Eos: 2 %
HEMATOCRIT: 31.3 % — AB (ref 34.0–46.6)
HIV SCREEN 4TH GENERATION: NONREACTIVE
Hemoglobin: 10.4 g/dL — ABNORMAL LOW (ref 11.1–15.9)
Hepatitis B Surface Ag: NEGATIVE
Immature Grans (Abs): 0.1 10*3/uL (ref 0.0–0.1)
Immature Granulocytes: 1 %
LYMPHS ABS: 2.2 10*3/uL (ref 0.7–3.1)
LYMPHS: 28 %
MCH: 32.8 pg (ref 26.6–33.0)
MCHC: 33.2 g/dL (ref 31.5–35.7)
MCV: 99 fL — AB (ref 79–97)
MONOS ABS: 0.6 10*3/uL (ref 0.1–0.9)
Monocytes: 8 %
NEUTROS ABS: 4.7 10*3/uL (ref 1.4–7.0)
Neutrophils: 60 %
Platelets: 395 10*3/uL (ref 150–450)
RBC: 3.17 x10E6/uL — AB (ref 3.77–5.28)
RDW: 12.1 % — AB (ref 12.3–15.4)
RH TYPE: NEGATIVE
RPR Ser Ql: NONREACTIVE
Rubella Antibodies, IGG: 1.22 index (ref >0.99)
WBC: 7.9 10*3/uL (ref 3.4–10.8)

## 2018-04-19 LAB — SMN1 COPY NUMBER ANALYSIS (SMA CARRIER SCREENING)

## 2018-05-01 ENCOUNTER — Other Ambulatory Visit: Payer: Medicaid Other

## 2018-05-02 ENCOUNTER — Other Ambulatory Visit: Payer: Medicaid Other

## 2018-05-02 ENCOUNTER — Encounter: Payer: Medicaid Other | Admitting: Obstetrics and Gynecology

## 2018-05-04 ENCOUNTER — Telehealth: Payer: Self-pay

## 2018-05-04 NOTE — Telephone Encounter (Signed)
Attempted to return call, called 2x, busy signal.

## 2018-05-07 ENCOUNTER — Encounter: Payer: Self-pay | Admitting: Obstetrics and Gynecology

## 2018-05-07 ENCOUNTER — Other Ambulatory Visit: Payer: Medicaid Other

## 2018-05-07 ENCOUNTER — Ambulatory Visit (INDEPENDENT_AMBULATORY_CARE_PROVIDER_SITE_OTHER): Payer: Medicaid Other | Admitting: Obstetrics and Gynecology

## 2018-05-07 VITALS — BP 124/79 | HR 99 | Wt 204.0 lb

## 2018-05-07 DIAGNOSIS — O360930 Maternal care for other rhesus isoimmunization, third trimester, not applicable or unspecified: Secondary | ICD-10-CM | POA: Diagnosis not present

## 2018-05-07 DIAGNOSIS — O09299 Supervision of pregnancy with other poor reproductive or obstetric history, unspecified trimester: Secondary | ICD-10-CM

## 2018-05-07 DIAGNOSIS — Z3480 Encounter for supervision of other normal pregnancy, unspecified trimester: Secondary | ICD-10-CM

## 2018-05-07 DIAGNOSIS — O99210 Obesity complicating pregnancy, unspecified trimester: Secondary | ICD-10-CM

## 2018-05-07 DIAGNOSIS — O0933 Supervision of pregnancy with insufficient antenatal care, third trimester: Secondary | ICD-10-CM

## 2018-05-07 DIAGNOSIS — O99213 Obesity complicating pregnancy, third trimester: Secondary | ICD-10-CM

## 2018-05-07 DIAGNOSIS — Z8632 Personal history of gestational diabetes: Secondary | ICD-10-CM

## 2018-05-07 DIAGNOSIS — Z6791 Unspecified blood type, Rh negative: Secondary | ICD-10-CM

## 2018-05-07 DIAGNOSIS — O09293 Supervision of pregnancy with other poor reproductive or obstetric history, third trimester: Secondary | ICD-10-CM

## 2018-05-07 DIAGNOSIS — O26899 Other specified pregnancy related conditions, unspecified trimester: Secondary | ICD-10-CM

## 2018-05-07 MED ORDER — RHO D IMMUNE GLOBULIN 1500 UNIT/2ML IJ SOSY
300.0000 ug | PREFILLED_SYRINGE | Freq: Once | INTRAMUSCULAR | Status: AC
  Administered 2018-05-07: 300 ug via INTRAMUSCULAR

## 2018-05-07 MED ORDER — PROMETHAZINE HCL 25 MG PO TABS: 25.0000 mg | ORAL_TABLET | Freq: Four times a day (QID) | ORAL | 1 refills | Status: DC | PRN

## 2018-05-07 NOTE — Addendum Note (Signed)
Addended by: Marya Landry D on: 05/07/2018 10:15 AM   Modules accepted: Orders

## 2018-05-07 NOTE — Progress Notes (Signed)
   PRENATAL VISIT NOTE  Subjective:  Annette Juarez is a 22 y.o. G2P1000 at [redacted]w[redacted]d being seen today for ongoing prenatal care.  She is currently monitored for the following issues for this high-risk pregnancy and has Obesity affecting pregnancy, antepartum; Susceptible to varicella (non-immune), currently pregnant; Rh negative, antepartum; Supervision of other normal pregnancy, antepartum; Late prenatal care affecting pregnancy; and History of gestational diabetes in prior pregnancy, currently pregnant on their problem list.  Patient reports no complaints.  Contractions: Irritability. Vag. Bleeding: None.  Movement: Present. Denies leaking of fluid.   The following portions of the patient's history were reviewed and updated as appropriate: allergies, current medications, past family history, past medical history, past social history, past surgical history and problem list. Problem list updated.  Objective:   Vitals:   05/07/18 0940  BP: 124/79  Pulse: 99  Weight: 204 lb (92.5 kg)    Fetal Status: Fetal Heart Rate (bpm): 140 Fundal Height: 30 cm Movement: Present     General:  Alert, oriented and cooperative. Patient is in no acute distress.  Skin: Skin is warm and dry. No rash noted.   Cardiovascular: Normal heart rate noted  Respiratory: Normal respiratory effort, no problems with respiration noted  Abdomen: Soft, gravid, appropriate for gestational age.  Pain/Pressure: Present     Pelvic: Cervical exam deferred        Extremities: Normal range of motion.     Mental Status: Normal mood and affect. Normal behavior. Normal judgment and thought content.   Assessment and Plan:  Pregnancy: G2P1000 at [redacted]w[redacted]d  1. Supervision of other normal pregnancy, antepartum Patient is doing well without complaints Anatomy ultrasound report reviewed Third trimester labs today Patient considering BTL- medicaid form signed today - Glucose Tolerance, 2 Hours w/1 Hour - CBC - HIV antibody -  RPR  2. Rh negative, antepartum Rhogam today  3. History of gestational diabetes in prior pregnancy, currently pregnant glucola today  4. Late prenatal care affecting pregnancy in third trimester Onset at 25 weeks  5. Obesity affecting pregnancy, antepartum   Preterm labor symptoms and general obstetric precautions including but not limited to vaginal bleeding, contractions, leaking of fluid and fetal movement were reviewed in detail with the patient. Please refer to After Visit Summary for other counseling recommendations.  Return in about 2 weeks (around 05/21/2018) for ROB.  Future Appointments  Date Time Provider Department Center  05/07/2018 10:15 AM Agastya Meister, Gigi Gin, MD [facility] None    Catalina Antigua, MD

## 2018-05-08 LAB — CBC
HEMATOCRIT: 29 % — AB (ref 34.0–46.6)
HEMOGLOBIN: 9.9 g/dL — AB (ref 11.1–15.9)
MCH: 32.6 pg (ref 26.6–33.0)
MCHC: 34.1 g/dL (ref 31.5–35.7)
MCV: 95 fL (ref 79–97)
Platelets: 344 10*3/uL (ref 150–450)
RBC: 3.04 x10E6/uL — AB (ref 3.77–5.28)
RDW: 12.1 % — ABNORMAL LOW (ref 12.3–15.4)
WBC: 8.6 10*3/uL (ref 3.4–10.8)

## 2018-05-08 LAB — HIV ANTIBODY (ROUTINE TESTING W REFLEX): HIV Screen 4th Generation wRfx: NONREACTIVE

## 2018-05-08 LAB — GLUCOSE TOLERANCE, 2 HOURS W/ 1HR
Glucose, 1 hour: 128 mg/dL (ref 65–179)
Glucose, 2 hour: 101 mg/dL (ref 65–152)
Glucose, Fasting: 86 mg/dL (ref 65–91)

## 2018-05-08 LAB — RPR: RPR: NONREACTIVE

## 2018-05-21 ENCOUNTER — Encounter: Payer: Medicaid Other | Admitting: Obstetrics & Gynecology

## 2018-05-23 ENCOUNTER — Encounter: Payer: Medicaid Other | Admitting: Obstetrics & Gynecology

## 2018-05-25 ENCOUNTER — Encounter (HOSPITAL_COMMUNITY): Payer: Self-pay | Admitting: *Deleted

## 2018-05-25 ENCOUNTER — Inpatient Hospital Stay (HOSPITAL_COMMUNITY)
Admission: AD | Admit: 2018-05-25 | Discharge: 2018-05-25 | Disposition: A | Discharge status: Home / Self Care | Payer: Medicaid Other | Source: Ambulatory Visit | Attending: Obstetrics & Gynecology | Admitting: Obstetrics & Gynecology

## 2018-05-25 DIAGNOSIS — Z825 Family history of asthma and other chronic lower respiratory diseases: Secondary | ICD-10-CM | POA: Diagnosis not present

## 2018-05-25 DIAGNOSIS — O2343 Unspecified infection of urinary tract in pregnancy, third trimester: Secondary | ICD-10-CM | POA: Diagnosis not present

## 2018-05-25 DIAGNOSIS — O26893 Other specified pregnancy related conditions, third trimester: Secondary | ICD-10-CM | POA: Diagnosis not present

## 2018-05-25 DIAGNOSIS — O0933 Supervision of pregnancy with insufficient antenatal care, third trimester: Secondary | ICD-10-CM | POA: Diagnosis not present

## 2018-05-25 DIAGNOSIS — Z3A31 31 weeks gestation of pregnancy: Secondary | ICD-10-CM | POA: Diagnosis not present

## 2018-05-25 DIAGNOSIS — Z8632 Personal history of gestational diabetes: Secondary | ICD-10-CM | POA: Insufficient documentation

## 2018-05-25 DIAGNOSIS — Z3480 Encounter for supervision of other normal pregnancy, unspecified trimester: Secondary | ICD-10-CM

## 2018-05-25 DIAGNOSIS — Z8249 Family history of ischemic heart disease and other diseases of the circulatory system: Secondary | ICD-10-CM | POA: Diagnosis not present

## 2018-05-25 DIAGNOSIS — Z79899 Other long term (current) drug therapy: Secondary | ICD-10-CM | POA: Insufficient documentation

## 2018-05-25 DIAGNOSIS — Z833 Family history of diabetes mellitus: Secondary | ICD-10-CM | POA: Diagnosis not present

## 2018-05-25 DIAGNOSIS — R109 Unspecified abdominal pain: Secondary | ICD-10-CM | POA: Diagnosis not present

## 2018-05-25 DIAGNOSIS — O09299 Supervision of pregnancy with other poor reproductive or obstetric history, unspecified trimester: Secondary | ICD-10-CM

## 2018-05-25 LAB — URINALYSIS, ROUTINE W REFLEX MICROSCOPIC
BILIRUBIN URINE: NEGATIVE
GLUCOSE, UA: NEGATIVE mg/dL
HGB URINE DIPSTICK: NEGATIVE
Ketones, ur: NEGATIVE mg/dL
NITRITE: NEGATIVE
Protein, ur: NEGATIVE mg/dL
SPECIFIC GRAVITY, URINE: 1.013 (ref 1.005–1.030)
pH: 7 (ref 5.0–8.0)

## 2018-05-25 MED ORDER — PHENAZOPYRIDINE HCL 100 MG PO TABS
100.0000 mg | ORAL_TABLET | Freq: Once | ORAL | Status: AC
  Administered 2018-05-25: 100 mg via ORAL
  Filled 2018-05-25: qty 1

## 2018-05-25 MED ORDER — CEPHALEXIN 500 MG PO CAPS
500.0000 mg | ORAL_CAPSULE | Freq: Once | ORAL | Status: AC
  Administered 2018-05-25: 500 mg via ORAL
  Filled 2018-05-25: qty 1

## 2018-05-25 MED ORDER — CEPHALEXIN 500 MG PO CAPS: 500.0000 mg | ORAL_CAPSULE | Freq: Three times a day (TID) | ORAL | 0 refills | Status: AC

## 2018-05-25 MED ORDER — PHENAZOPYRIDINE HCL 200 MG PO TABS: 200.0000 mg | ORAL_TABLET | Freq: Three times a day (TID) | ORAL | 1 refills | Status: DC | PRN

## 2018-05-25 MED ORDER — COMFORT FIT MATERNITY SUPP MED MISC: 1.0000 | Freq: Every day | 0 refills | Status: DC

## 2018-05-25 NOTE — MAU Note (Signed)
PT SAYS  FEELS CONTRACTIONS- STARTED AT 0930-    THOUGHT BRAXTON  HICKS.   [facility]- [facility] - DID NOT CALL DR.   DENIES HSV AND MRSA.   LAST SEX-   BEGINNING OF SEPT.

## 2018-05-25 NOTE — MAU Provider Note (Addendum)
Chief Complaint:  Contractions   First Provider Initiated Contact with Patient 05/25/18 2129      HPI: Annette Juarez is a 22 y.o. G2P1001 at [redacted]w[redacted]d who presents to maternity admissions reporting onset of cramping pain every 6-7 minutes starting this morning. She reports the pain is in her low abdomen, bilateral groin areas and shooting into her vagina. The pain is intermittent.  Nothing makes it better or worse. It is associated with urinary frequency and loose and frequent bowel movements. She denies sick contacts. She denies n/v.  There are no other associated symptoms. She has not tried any treatments. She reports good fetal movement, denies LOF, vaginal bleeding, vaginal itching/burning, h/a, dizziness, n/v, or fever/chills.    HPI  Past Medical History: Past Medical History:  Diagnosis Date  . Diabetes mellitus without complication (HCC)    Gestational diet control  . Gestational diabetes     Past obstetric history: OB History  Gravida Para Term Preterm AB Living  2 1 1  0 0 1  SAB TAB Ectopic Multiple Live Births  0 0 0 0 1    # Outcome Date GA Lbr Len/2nd Weight Sex Delivery Anes PTL Lv  2 Current           1 Term 11/15/16 [redacted]w[redacted]d    Vag-Spont       Past Surgical History: Past Surgical History:  Procedure Laterality Date  . NO PAST SURGERIES      Family History: Family History  Problem Relation Age of Onset  . Hypertension Mother   . Diabetes Father   . Asthma Sister   . Diabetes Maternal Grandmother   . Hypertension Maternal Grandmother   . Hypertension Maternal Grandfather     Social History: Social History   Tobacco Use  . Smoking status: Never Smoker  . Smokeless tobacco: Never Used  Substance Use Topics  . Alcohol use: No  . Drug use: No    Allergies: No Known Allergies  Meds:  Medications Prior to Admission  Medication Sig Dispense Refill Last Dose  . Doxylamine-Pyridoxine (DICLEGIS) 10-10 MG TBEC Take 1 tablet with breakfast and lunch.  Take  2 tablets at bedtime. 100 tablet 4 Taking  . Prenatal-Fe Fum-Methf-FA w/o A (VITAFOL-NANO) 18-0.6-0.4 MG TABS Take 1 tablet by mouth daily. 30 tablet 12 Taking  . promethazine (PHENERGAN) 25 MG tablet Take 1 tablet (25 mg total) by mouth every 6 (six) hours as needed for nausea or vomiting. 30 tablet 1     ROS:  Review of Systems  Constitutional: Negative for chills, fatigue and fever.  Eyes: Negative for visual disturbance.  Respiratory: Negative for shortness of breath.   Cardiovascular: Negative for chest pain.  Gastrointestinal: Positive for abdominal pain. Negative for nausea and vomiting.  Genitourinary: Positive for frequency, pelvic pain and vaginal pain. Negative for difficulty urinating, dysuria, flank pain, vaginal bleeding and vaginal discharge.  Musculoskeletal: Positive for back pain.  Neurological: Negative for dizziness and headaches.  Psychiatric/Behavioral: Negative.      I have reviewed patient's Past Medical Hx, Surgical Hx, Family Hx, Social Hx, medications and allergies.   Physical Exam   Patient Vitals for the past 24 hrs:  BP Temp Temp src Pulse Resp Height Weight  05/25/18 2140 109/69 98.3 F (36.8 C) Oral 93 16 - -  05/25/18 2018 (!) 117/59 98.3 F (36.8 C) Oral (!) 105 - 5\' 3"  (1.6 m) 91.9 kg   Constitutional: Well-developed, well-nourished female in no acute distress.  Cardiovascular: normal rate  Respiratory: normal effort GI: Abd soft, non-tender, gravid appropriate for gestational age.  MS: Extremities nontender, no edema, normal ROM Neurologic: Alert and oriented x 4.  GU: Neg CVAT.  PELVIC EXAM:   Dilation: Closed Effacement (%): Thick Cervical Position: Posterior Exam by:: L. Leftwich-Kirby, CNM  FHT:  Baseline 135  , moderate variability, accelerations present, no decelerations Contractions: irritability, mild to palpation   Labs: Results for orders placed or performed during the hospital encounter of 05/25/18 (from the past 24  hour(s))  Urinalysis, Routine w reflex microscopic     Status: Abnormal   Collection Time: 05/25/18  8:23 PM  Result Value Ref Range   Color, Urine YELLOW YELLOW   APPearance HAZY (A) CLEAR   Specific Gravity, Urine 1.013 1.005 - 1.030   pH 7.0 5.0 - 8.0   Glucose, UA NEGATIVE NEGATIVE mg/dL   Hgb urine dipstick NEGATIVE NEGATIVE   Bilirubin Urine NEGATIVE NEGATIVE   Ketones, ur NEGATIVE NEGATIVE mg/dL   Protein, ur NEGATIVE NEGATIVE mg/dL   Nitrite NEGATIVE NEGATIVE   Leukocytes, UA LARGE (A) NEGATIVE   RBC / HPF 6-10 0 - 5 RBC/hpf   WBC, UA 21-50 0 - 5 WBC/hpf   Bacteria, UA MANY (A) NONE SEEN   Squamous Epithelial / LPF 6-10 0 - 5   Mucus PRESENT    O/Negative/-- (08/13 1118)  Imaging:  No results found.  MAU Course/MDM: I have ordered labs and reviewed results.  NST reviewed and reactive Cervix closed/thick, no evidence of preterm labor UA with large leukocytes and many WBCs, likely UTI contributing to pt pain Will treat with Keflex and pyridium with first doses given tonight in MAU due to late hour F/U at [facility] as scheduled Return to MAU as needed for emergencies Rx for maternity support belt for pelvic pain of pregnancy   Pt discharged with strict return precautions.  Today's evaluation included a work-up for preterm labor which can be life-threatening for both mom and baby.   Addendum: Pt left MAU prior to taking medications and without discharge teaching or printed Rx for support belt.  Assessment: 1. UTI (urinary tract infection) during pregnancy, third trimester   2. Supervision of other normal pregnancy, antepartum   3. Late prenatal care affecting pregnancy in third trimester   4. History of gestational diabetes in prior pregnancy, currently pregnant   5. Abdominal pain during pregnancy in third trimester     Plan: Discharge home Labor precautions and fetal kick counts Follow-up Information    [facility] Follow up.   Why:  As scheduled,  return to MAU as needed for emergencies. Contact information: [address] [place] [address] [phone]         Allergies as of 05/25/2018   No Known Allergies     Medication List    TAKE these medications   cephALEXin 500 MG capsule Commonly known as:  KEFLEX Take 1 capsule (500 mg total) by mouth 3 (three) times daily for 7 days.   COMFORT FIT MATERNITY SUPP MED Misc 1 Device by Does not apply route daily.   Doxylamine-Pyridoxine 10-10 MG Tbec Take 1 tablet with breakfast and lunch.  Take 2 tablets at bedtime.   phenazopyridine 200 MG tablet Commonly known as:  PYRIDIUM Take 1 tablet (200 mg total) by mouth 3 (three) times daily as needed for pain (urethral spasm).   promethazine 25 MG tablet Commonly known as:  PHENERGAN Take 1 tablet (25 mg total) by mouth  every 6 (six) hours as needed for nausea or vomiting.   VITAFOL-NANO 18-0.6-0.4 MG Tabs Take 1 tablet by mouth daily.       Sharen Counter Certified Nurse-Midwife 05/25/2018 10:02 PM

## 2018-05-25 NOTE — Progress Notes (Signed)
Patient came out of room and told Nurse Tech that her ride was here and that she had to go.  Patient had been educated by [facility] about plan/Rx to pick up tomorrow.  Patient verbalized understanding, however, patient did not sign AVS or receive printed copy.  CNM aware.

## 2018-05-27 LAB — CULTURE, OB URINE

## 2018-06-13 ENCOUNTER — Encounter: Payer: Medicaid Other | Admitting: Certified Nurse Midwife

## 2018-07-02 ENCOUNTER — Other Ambulatory Visit (HOSPITAL_COMMUNITY)
Admission: RE | Admit: 2018-07-02 | Discharge: 2018-07-02 | Disposition: A | Discharge status: Home / Self Care | Payer: Medicaid Other | Source: Ambulatory Visit | Attending: Obstetrics & Gynecology | Admitting: Obstetrics & Gynecology

## 2018-07-02 ENCOUNTER — Ambulatory Visit (INDEPENDENT_AMBULATORY_CARE_PROVIDER_SITE_OTHER): Payer: Medicaid Other | Admitting: Advanced Practice Midwife

## 2018-07-02 VITALS — BP 117/69 | HR 97 | Wt 211.1 lb

## 2018-07-02 DIAGNOSIS — Z87440 Personal history of urinary (tract) infections: Secondary | ICD-10-CM

## 2018-07-02 DIAGNOSIS — Z3480 Encounter for supervision of other normal pregnancy, unspecified trimester: Secondary | ICD-10-CM | POA: Diagnosis present

## 2018-07-02 LAB — POCT URINALYSIS DIPSTICK
Bilirubin, UA: NEGATIVE
Blood, UA: NEGATIVE
GLUCOSE UA: NEGATIVE
KETONES UA: NEGATIVE
LEUKOCYTES UA: NEGATIVE
NITRITE UA: NEGATIVE
Protein, UA: NEGATIVE
SPEC GRAV UA: 1.015 (ref 1.010–1.025)
UROBILINOGEN UA: 0.2 U/dL
pH, UA: 7.5 (ref 5.0–8.0)

## 2018-07-02 NOTE — Progress Notes (Signed)
   PRENATAL VISIT NOTE  Subjective:  Annette Juarez is a 22 y.o. G2P1001 at [redacted]w[redacted]d being seen today for ongoing prenatal care.  She is currently monitored for the following issues for this low-risk pregnancy and has Obesity affecting pregnancy, antepartum; Susceptible to varicella (non-immune), currently pregnant; Rh negative, antepartum; Supervision of other normal pregnancy, antepartum; Late prenatal care affecting pregnancy; and History of gestational diabetes in prior pregnancy, currently pregnant on their problem list.  Patient reports occasional contractions.  Contractions: Irritability. Vag. Bleeding: None.  Movement: Present. Denies leaking of fluid.   The following portions of the patient's history were reviewed and updated as appropriate: allergies, current medications, past family history, past medical history, past social history, past surgical history and problem list. Problem list updated.  Objective:   Vitals:   07/02/18 1026  BP: 117/69  Pulse: 97  Weight: 95.8 kg    Fetal Status:   Fundal Height: 37 cm Movement: Present  Presentation: Vertex  General:  Alert, oriented and cooperative. Patient is in no acute distress.  Skin: Skin is warm and dry. No rash noted.   Cardiovascular: Normal heart rate noted  Respiratory: Normal respiratory effort, no problems with respiration noted  Abdomen: Soft, gravid, appropriate for gestational age.  Pain/Pressure: Present     Pelvic: Cervical exam performed Dilation: 2 Effacement (%): 50 Station: -3  Extremities: Normal range of motion.  Edema: None  Mental Status: Normal mood and affect. Normal behavior. Normal judgment and thought content.   Assessment and Plan:  Pregnancy: G2P1001 at [redacted]w[redacted]d  1. History of UTI --TOC today - POCT Urinalysis Dipstick - Urine Culture  2. Supervision of other normal pregnancy, antepartum --Pt with cramping/contractions 3-4 times/day and painful.  Desires cervical exam today.  Cervix 2/50/-3.   Reviewed signs of labor/reasons to go to MAU. --Anticipatory guidance about next visits/weeks of pregnancy given. - Strep Gp B NAA - Cervicovaginal ancillary only  Term labor symptoms and general obstetric precautions including but not limited to vaginal bleeding, contractions, leaking of fluid and fetal movement were reviewed in detail with the patient. Please refer to After Visit Summary for other counseling recommendations.  Return in about 1 week (around 07/09/2018).  No future appointments.  Sharen Counter, CNM

## 2018-07-02 NOTE — Patient Instructions (Signed)

## 2018-07-02 NOTE — Progress Notes (Signed)
Pt is here for ROB. G2P1 [redacted]w[redacted]d.

## 2018-07-03 LAB — CERVICOVAGINAL ANCILLARY ONLY
Chlamydia: NEGATIVE
Neisseria Gonorrhea: NEGATIVE

## 2018-07-04 LAB — STREP GP B NAA: STREP GROUP B AG: POSITIVE — AB

## 2018-07-04 LAB — URINE CULTURE

## 2018-07-05 ENCOUNTER — Encounter: Payer: Self-pay | Admitting: Advanced Practice Midwife

## 2018-07-05 DIAGNOSIS — O99820 Streptococcus B carrier state complicating pregnancy: Secondary | ICD-10-CM | POA: Insufficient documentation

## 2018-07-09 ENCOUNTER — Encounter: Payer: Medicaid Other | Admitting: Obstetrics and Gynecology

## 2018-07-10 ENCOUNTER — Ambulatory Visit (INDEPENDENT_AMBULATORY_CARE_PROVIDER_SITE_OTHER): Payer: Medicaid Other | Admitting: Obstetrics and Gynecology

## 2018-07-10 ENCOUNTER — Encounter: Payer: Self-pay | Admitting: Obstetrics and Gynecology

## 2018-07-10 VITALS — BP 130/84 | HR 108 | Wt 215.5 lb

## 2018-07-10 DIAGNOSIS — Z3483 Encounter for supervision of other normal pregnancy, third trimester: Secondary | ICD-10-CM

## 2018-07-10 DIAGNOSIS — Z6791 Unspecified blood type, Rh negative: Secondary | ICD-10-CM

## 2018-07-10 DIAGNOSIS — O99820 Streptococcus B carrier state complicating pregnancy: Secondary | ICD-10-CM | POA: Diagnosis not present

## 2018-07-10 DIAGNOSIS — O26893 Other specified pregnancy related conditions, third trimester: Secondary | ICD-10-CM | POA: Diagnosis not present

## 2018-07-10 DIAGNOSIS — O0933 Supervision of pregnancy with insufficient antenatal care, third trimester: Secondary | ICD-10-CM

## 2018-07-10 DIAGNOSIS — O26899 Other specified pregnancy related conditions, unspecified trimester: Secondary | ICD-10-CM

## 2018-07-10 DIAGNOSIS — Z23 Encounter for immunization: Secondary | ICD-10-CM

## 2018-07-10 DIAGNOSIS — Z3480 Encounter for supervision of other normal pregnancy, unspecified trimester: Secondary | ICD-10-CM

## 2018-07-10 NOTE — Progress Notes (Signed)
   PRENATAL VISIT NOTE  Subjective:  Annette Juarez is a 22 y.o. G2P1001 at 7832w2d being seen today for ongoing prenatal care.  She is currently monitored for the following issues for this low-risk pregnancy and has Obesity affecting pregnancy, antepartum; Susceptible to varicella (non-immune), currently pregnant; Rh negative, antepartum; Supervision of other normal pregnancy, antepartum; Late prenatal care affecting pregnancy; History of gestational diabetes in prior pregnancy, currently pregnant; and GBS (group B Streptococcus carrier), +RV culture, currently pregnant on their problem list.  Patient reports backache.  Contractions: Irregular. Vag. Bleeding: None.  Movement: Present. Denies leaking of fluid.   The following portions of the patient's history were reviewed and updated as appropriate: allergies, current medications, past family history, past medical history, past social history, past surgical history and problem list. Problem list updated.  Objective:   Vitals:   07/10/18 1359  BP: 130/84  Pulse: (!) 108  Weight: 215 lb 8 oz (97.8 kg)    Fetal Status: Fetal Heart Rate (bpm): 140 Fundal Height: 37 cm Movement: Present  Presentation: Vertex  General:  Alert, oriented and cooperative. Patient is in no acute distress.  Skin: Skin is warm and dry. No rash noted.   Cardiovascular: Normal heart rate noted  Respiratory: Normal respiratory effort, no problems with respiration noted  Abdomen: Soft, gravid, appropriate for gestational age.  Pain/Pressure: Present     Pelvic: Cervical exam performed Dilation: 2 Effacement (%): 50 Station: -3  Extremities: Normal range of motion.  Edema: None  Mental Status: Normal mood and affect. Normal behavior. Normal judgment and thought content.   Assessment and Plan:  Pregnancy: G2P1001 at 6032w2d  1. Supervision of other normal pregnancy, antepartum Patient is doing well Tdap today - Tdap vaccine greater than or equal to 7yo IM  2. GBS  (group B Streptococcus carrier), +RV culture, currently pregnant Will receive prophylaxis in labor  3. Rh negative, antepartum S/p rhogam  4. Late prenatal care affecting pregnancy in third trimester Onset of care at 25 weeks  Term labor symptoms and general obstetric precautions including but not limited to vaginal bleeding, contractions, leaking of fluid and fetal movement were reviewed in detail with the patient. Please refer to After Visit Summary for other counseling recommendations.  Return in about 1 week (around 07/17/2018) for ROB.  No future appointments.  Catalina AntiguaPeggy Tequisha Maahs, MD

## 2018-07-10 NOTE — Progress Notes (Signed)
Pt is here for ROB. G2P1 [redacted]w[redacted]d.

## 2018-07-18 ENCOUNTER — Encounter: Payer: Medicaid Other | Admitting: Advanced Practice Midwife

## 2018-07-18 NOTE — Progress Notes (Deleted)
   PRENATAL VISIT NOTE  Subjective:  Annette Juarez is a 22 y.o. G2P1001 at 5252w3d being seen today for ongoing prenatal care.  She is currently monitored for the following issues for this {Blank single:19197::"high-risk","low-risk"} pregnancy and has Obesity affecting pregnancy, antepartum; Susceptible to varicella (non-immune), currently pregnant; Rh negative, antepartum; Supervision of other normal pregnancy, antepartum; Late prenatal care affecting pregnancy; History of gestational diabetes in prior pregnancy, currently pregnant; and GBS (group B Streptococcus carrier), +RV culture, currently pregnant on their problem list.  Patient reports {sx:14538}.   .  .   . Denies leaking of fluid.   The following portions of the patient's history were reviewed and updated as appropriate: allergies, current medications, past family history, past medical history, past social history, past surgical history and problem list. Problem list updated.  Objective:  There were no vitals filed for this visit.  Fetal Status:           General:  Alert, oriented and cooperative. Patient is in no acute distress.  Skin: Skin is warm and dry. No rash noted.   Cardiovascular: Normal heart rate noted  Respiratory: Normal respiratory effort, no problems with respiration noted  Abdomen: Soft, gravid, appropriate for gestational age.        Pelvic: {Blank single:19197::"Cervical exam performed","Cervical exam deferred"}        Extremities: Normal range of motion.     Mental Status: Normal mood and affect. Normal behavior. Normal judgment and thought content.   Assessment and Plan:  Pregnancy: G2P1001 at 4452w3d  1. Supervision of other normal pregnancy, antepartum ***  2. GBS (group B Streptococcus carrier), +RV culture, currently pregnant ***  {Blank single:19197::"Term","Preterm"} labor symptoms and general obstetric precautions including but not limited to vaginal bleeding, contractions, leaking of fluid and  fetal movement were reviewed in detail with the patient. Please refer to After Visit Summary for other counseling recommendations.  No follow-ups on file.  Future Appointments  Date Time Provider Department Center  07/18/2018  1:45 PM Leftwich-Kirby, Wilmer FloorLisa A, CNM [facility] None    Sharen CounterLisa Leftwich-Kirby, CNM

## 2018-07-22 ENCOUNTER — Encounter (HOSPITAL_COMMUNITY): Payer: Self-pay | Admitting: *Deleted

## 2018-07-22 ENCOUNTER — Inpatient Hospital Stay (HOSPITAL_COMMUNITY)
Admission: AD | Admit: 2018-07-22 | Discharge: 2018-07-24 | DRG: 798 | Disposition: A | Discharge status: Home / Self Care | Payer: Medicaid Other | Attending: Obstetrics & Gynecology | Admitting: Obstetrics & Gynecology

## 2018-07-22 ENCOUNTER — Other Ambulatory Visit: Payer: Self-pay

## 2018-07-22 DIAGNOSIS — Z825 Family history of asthma and other chronic lower respiratory diseases: Secondary | ICD-10-CM | POA: Diagnosis not present

## 2018-07-22 DIAGNOSIS — Z302 Encounter for sterilization: Secondary | ICD-10-CM

## 2018-07-22 DIAGNOSIS — Z8632 Personal history of gestational diabetes: Secondary | ICD-10-CM

## 2018-07-22 DIAGNOSIS — Z833 Family history of diabetes mellitus: Secondary | ICD-10-CM | POA: Diagnosis not present

## 2018-07-22 DIAGNOSIS — Z8249 Family history of ischemic heart disease and other diseases of the circulatory system: Secondary | ICD-10-CM | POA: Diagnosis not present

## 2018-07-22 DIAGNOSIS — Z3480 Encounter for supervision of other normal pregnancy, unspecified trimester: Secondary | ICD-10-CM

## 2018-07-22 DIAGNOSIS — O139 Gestational [pregnancy-induced] hypertension without significant proteinuria, unspecified trimester: Secondary | ICD-10-CM | POA: Diagnosis present

## 2018-07-22 DIAGNOSIS — O134 Gestational [pregnancy-induced] hypertension without significant proteinuria, complicating childbirth: Secondary | ICD-10-CM | POA: Diagnosis present

## 2018-07-22 DIAGNOSIS — O24420 Gestational diabetes mellitus in childbirth, diet controlled: Secondary | ICD-10-CM | POA: Diagnosis present

## 2018-07-22 DIAGNOSIS — O09299 Supervision of pregnancy with other poor reproductive or obstetric history, unspecified trimester: Secondary | ICD-10-CM

## 2018-07-22 DIAGNOSIS — Z3A40 40 weeks gestation of pregnancy: Secondary | ICD-10-CM | POA: Diagnosis not present

## 2018-07-22 DIAGNOSIS — O99824 Streptococcus B carrier state complicating childbirth: Secondary | ICD-10-CM | POA: Diagnosis present

## 2018-07-22 DIAGNOSIS — O480 Post-term pregnancy: Secondary | ICD-10-CM | POA: Diagnosis present

## 2018-07-22 DIAGNOSIS — O99820 Streptococcus B carrier state complicating pregnancy: Secondary | ICD-10-CM

## 2018-07-22 DIAGNOSIS — O0933 Supervision of pregnancy with insufficient antenatal care, third trimester: Secondary | ICD-10-CM

## 2018-07-22 HISTORY — DX: Urinary tract infection, site not specified: N39.0

## 2018-07-22 HISTORY — DX: Gastro-esophageal reflux disease without esophagitis: K21.9

## 2018-07-22 LAB — COMPREHENSIVE METABOLIC PANEL
ALT: 7 U/L (ref 0–44)
AST: 10 U/L — AB (ref 15–41)
Albumin: 2.6 g/dL — ABNORMAL LOW (ref 3.5–5.0)
Alkaline Phosphatase: 111 U/L (ref 38–126)
Anion gap: 7 (ref 5–15)
BILIRUBIN TOTAL: 0.8 mg/dL (ref 0.3–1.2)
CALCIUM: 8.3 mg/dL — AB (ref 8.9–10.3)
CHLORIDE: 104 mmol/L (ref 98–111)
CO2: 23 mmol/L (ref 22–32)
CREATININE: 0.39 mg/dL — AB (ref 0.44–1.00)
GFR calc Af Amer: >60 mL/min (ref >60)
Glucose, Bld: 74 mg/dL (ref 70–99)
Potassium: 4.1 mmol/L (ref 3.5–5.1)
Sodium: 134 mmol/L — ABNORMAL LOW (ref 135–145)
Total Protein: 6 g/dL — ABNORMAL LOW (ref 6.5–8.1)

## 2018-07-22 LAB — CBC
HEMATOCRIT: 30.9 % — AB (ref 36.0–46.0)
Hemoglobin: 10.3 g/dL — ABNORMAL LOW (ref 12.0–15.0)
MCH: 32.3 pg (ref 26.0–34.0)
MCHC: 33.3 g/dL (ref 30.0–36.0)
MCV: 96.9 fL (ref 80.0–100.0)
NRBC: 0 % (ref 0.0–0.2)
Platelets: 323 10*3/uL (ref 150–400)
RBC: 3.19 MIL/uL — AB (ref 3.87–5.11)
RDW: 13.1 % (ref 11.5–15.5)
WBC: 9.1 10*3/uL (ref 4.0–10.5)

## 2018-07-22 LAB — PROTEIN / CREATININE RATIO, URINE
CREATININE, URINE: 83 mg/dL
Protein Creatinine Ratio: 0.13 mg/mg{Cre} (ref 0.00–0.15)
Total Protein, Urine: 11 mg/dL

## 2018-07-22 LAB — TYPE AND SCREEN
ABO/RH(D): O NEG
Antibody Screen: NEGATIVE

## 2018-07-22 MED ORDER — MISOPROSTOL 50MCG HALF TABLET: 50.0000 ug | ORAL_TABLET | ORAL | Status: DC | PRN

## 2018-07-22 MED ORDER — FENTANYL CITRATE (PF) 100 MCG/2ML IJ SOLN
100.0000 ug | INTRAMUSCULAR | Status: DC | PRN
  Administered 2018-07-22: 100 ug via INTRAVENOUS
  Filled 2018-07-22: qty 2

## 2018-07-22 MED ORDER — LIDOCAINE HCL (PF) 1 % IJ SOLN
30.0000 mL | INTRAMUSCULAR | Status: DC | PRN
  Filled 2018-07-22: qty 30

## 2018-07-22 MED ORDER — OXYTOCIN BOLUS FROM INFUSION
500.0000 mL | Freq: Once | INTRAVENOUS | Status: AC
  Administered 2018-07-23: 500 mL via INTRAVENOUS

## 2018-07-22 MED ORDER — LACTATED RINGERS IV SOLN
INTRAVENOUS | Status: DC
  Administered 2018-07-22 (×2): via INTRAVENOUS

## 2018-07-22 MED ORDER — OXYTOCIN 40 UNITS IN LACTATED RINGERS INFUSION - SIMPLE MED
2.5000 [IU]/h | INTRAVENOUS | Status: DC
  Administered 2018-07-23: 2.5 [IU]/h via INTRAVENOUS
  Filled 2018-07-22: qty 1000

## 2018-07-22 MED ORDER — SODIUM CHLORIDE 0.9 % IV SOLN
5.0000 10*6.[IU] | Freq: Once | INTRAVENOUS | Status: AC
  Administered 2018-07-23: 5 10*6.[IU] via INTRAVENOUS
  Filled 2018-07-22: qty 5

## 2018-07-22 MED ORDER — PENICILLIN G 3 MILLION UNITS IVPB - SIMPLE MED
3.0000 10*6.[IU] | INTRAVENOUS | Status: DC
  Administered 2018-07-23: 3 10*6.[IU] via INTRAVENOUS
  Filled 2018-07-22 (×2): qty 100

## 2018-07-22 MED ORDER — TERBUTALINE SULFATE 1 MG/ML IJ SOLN
0.2500 mg | Freq: Once | INTRAMUSCULAR | Status: DC | PRN
  Filled 2018-07-22: qty 1

## 2018-07-22 MED ORDER — FLEET ENEMA 7-19 GM/118ML RE ENEM: 1.0000 | ENEMA | RECTAL | Status: DC | PRN

## 2018-07-22 MED ORDER — ONDANSETRON HCL 4 MG/2ML IJ SOLN
4.0000 mg | Freq: Four times a day (QID) | INTRAMUSCULAR | Status: DC | PRN
  Administered 2018-07-23: 4 mg via INTRAVENOUS
  Filled 2018-07-22: qty 2

## 2018-07-22 MED ORDER — MISOPROSTOL 25 MCG QUARTER TABLET
25.0000 ug | ORAL_TABLET | ORAL | Status: DC | PRN
  Administered 2018-07-22: 25 ug via VAGINAL
  Filled 2018-07-22: qty 1

## 2018-07-22 MED ORDER — LACTATED RINGERS IV SOLN: 500.0000 mL | INTRAVENOUS | Status: DC | PRN

## 2018-07-22 MED ORDER — LIDOCAINE HCL URETHRAL/MUCOSAL 2 % EX GEL
1.0000 "application " | Freq: Once | CUTANEOUS | Status: AC
  Administered 2018-07-22: 1 via TOPICAL
  Filled 2018-07-22: qty 5

## 2018-07-22 MED ORDER — SOD CITRATE-CITRIC ACID 500-334 MG/5ML PO SOLN: 30.0000 mL | ORAL | Status: DC | PRN

## 2018-07-22 NOTE — H&P (Signed)
OBSTETRIC ADMISSION HISTORY AND PHYSICAL  Rufus Henriette CombsS Hegg is a 22 y.o. female G2P1001 with IUP at 6247w0d by L/26 presenting for pelvic pressure, contractions, and hemorrhoids. States started feeling contractions this morning but not too intense. Would not have come to be evaluated but lots of pelvic pressure that is constant and hemorrhoids. In triage, had elevated blood pressures in 140s systolic, denied headache, vision changes, abdominal pain.    Reports fetal movement. Denies vaginal bleeding, leakage of fluids.  She [facility]  Support person in labor: Partner  Ultrasounds . Anatomy U/S: 26w2: normal anatomy scan aside from intracardiac echogenic focus   Prenatal History/Complications: . History of GDM - poorly controlled but never started on medication . Late onset and limited prenatal care . GBS positive - will give antibiotics in labor . Echogenic intracardiac focus   Past Medical History: Past Medical History:  Diagnosis Date  . Diabetes mellitus without complication (HCC)    Gestational diet control  . GERD (gastroesophageal reflux disease)   . Gestational diabetes   . UTI (urinary tract infection)     Past Surgical History: Past Surgical History:  Procedure Laterality Date  . NO PAST SURGERIES      Obstetrical History: OB History    Gravida  2   Para  1   Term  1   Preterm  0   AB  0   Living  1     SAB  0   TAB  0   Ectopic  0   Multiple  0   Live Births  1           Social History: Social History   Socioeconomic History  . Marital status: Single    Spouse name: Not on file  . Number of children: Not on file  . Years of education: Not on file  . Highest education level: Not on file  Occupational History  . Not on file  Social Needs  . Financial resource strain: Not hard at all  . Food insecurity:    Worry: Never true    Inability: Never true  . Transportation needs:    Medical: No    Non-medical:  No  Tobacco Use  . Smoking status: Never Smoker  . Smokeless tobacco: Never Used  Substance and Sexual Activity  . Alcohol use: No  . Drug use: No  . Sexual activity: Yes    Birth control/protection: None  Lifestyle  . Physical activity:    Days per week: Not on file    Minutes per session: Not on file  . Stress: Not at all  Relationships  . Social connections:    Talks on phone: Not on file    Gets together: Not on file    Attends religious service: Not on file    Active member of club or organization: Not on file    Attends meetings of clubs or organizations: Not on file    Relationship status: Not on file  Other Topics Concern  . Not on file  Social History Narrative  . Not on file    Family History: Family History  Problem Relation Age of Onset  . Hypertension Mother   . Diabetes Father   . Asthma Sister   . Diabetes Maternal Grandmother   . Hypertension Maternal Grandmother   . Hypertension Maternal Grandfather     Allergies: No Known Allergies  Medications Prior to Admission  Medication Sig Dispense Refill Last Dose  .  Prenatal-Fe Fum-Methf-FA w/o A (VITAFOL-NANO) 18-0.6-0.4 MG TABS Take 1 tablet by mouth daily. 30 tablet 12 07/22/2018 at Unknown time     Review of Systems  All systems reviewed and negative except as stated in HPI  Blood pressure 130/65, pulse (!) 106, temperature 97.9 F (36.6 C), temperature source Oral, resp. rate 16, height 5\' 5"  (1.651 m), weight 98 kg, last menstrual period 10/15/2017, SpO2 100 %, unknown if currently breastfeeding. General appearance: lying on side, uncomfortable appearing Lungs: no respiratory distress Heart: regular rate  Abdomen: soft, non-tender; gravid Pelvic: deferred Extremities: Homans sign is negative, no sign of DVT Presentation: cephalic by BSUS Fetal monitoring: 145/mod/+a/occ variables Uterine activity: irritability  Dilation: (ext os 2cm int os closed) Station: -3 Exam by:: Domenic Polite  Prenatal labs: ABO, Rh: O/Negative/-- (08/13 1118) Antibody: Negative (08/13 1118) Rubella: 1.22 (08/13 1118) RPR: Non Reactive (09/09 1135)  HBsAg: Negative (08/13 1118)  HIV: Non Reactive (09/09 1135)  GBS: Positive (11/04 1052)  Glucola: normal 2-hr (86/128/101) Genetic screening:  Low risk NIPS  Prenatal Transfer Tool  Maternal Diabetes: No Genetic Screening: Normal Maternal Ultrasounds/Referrals: Abnormal:  Findings:   Other: echogenic intracardiac focus Fetal Ultrasounds or other Referrals:  None Maternal Substance Abuse:  No Significant Maternal Medications:  None Significant Maternal Lab Results: None  Results for orders placed or performed during the hospital encounter of 07/22/18 (from the past 24 hour(s))  Protein / creatinine ratio, urine   Collection Time: 07/22/18  8:50 PM  Result Value Ref Range   Creatinine, Urine 83.00 mg/dL   Total Protein, Urine 11 mg/dL   Protein Creatinine Ratio 0.13 0.00 - 0.15 mg/mg[Cre]  CBC   Collection Time: 07/22/18  9:12 PM  Result Value Ref Range   WBC 9.1 4.0 - 10.5 K/uL   RBC 3.19 (L) 3.87 - 5.11 MIL/uL   Hemoglobin 10.3 (L) 12.0 - 15.0 g/dL   HCT 16.1 (L) 09.6 - 04.5 %   MCV 96.9 80.0 - 100.0 fL   MCH 32.3 26.0 - 34.0 pg   MCHC 33.3 30.0 - 36.0 g/dL   RDW 40.9 81.1 - 91.4 %   Platelets 323 150 - 400 K/uL   nRBC 0.0 0.0 - 0.2 %  Comprehensive metabolic panel   Collection Time: 07/22/18  9:12 PM  Result Value Ref Range   Sodium 134 (L) 135 - 145 mmol/L   Potassium 4.1 3.5 - 5.1 mmol/L   Chloride 104 98 - 111 mmol/L   CO2 23 22 - 32 mmol/L   Glucose, Bld 74 70 - 99 mg/dL   BUN <5 (L) 6 - 20 mg/dL   Creatinine, Ser 7.82 (L) 0.44 - 1.00 mg/dL   Calcium 8.3 (L) 8.9 - 10.3 mg/dL   Total Protein 6.0 (L) 6.5 - 8.1 g/dL   Albumin 2.6 (L) 3.5 - 5.0 g/dL   AST 10 (L) 15 - 41 U/L   ALT 7 0 - 44 U/L   Alkaline Phosphatase 111 38 - 126 U/L   Total Bilirubin 0.8 0.3 - 1.2 mg/dL   GFR calc non Af Amer >60 >60 mL/min    GFR calc Af Amer >60 >60 mL/min   Anion gap 7 5 - 15    Patient Active Problem List   Diagnosis Date Noted  . GBS (group B Streptococcus carrier), +RV culture, currently pregnant 07/05/2018  . Supervision of other normal pregnancy, antepartum 04/10/2018  . Late prenatal care affecting pregnancy 04/10/2018  . History of gestational diabetes in prior  pregnancy, currently pregnant 04/10/2018  . Rh negative, antepartum 05/18/2016  . Susceptible to varicella (non-immune), currently pregnant 05/12/2016  . Obesity affecting pregnancy, antepartum 05/11/2016    Assessment/Plan:  MARINELL IGARASHI is a 22 y.o. G2P1001 at [redacted]w[redacted]d here for IOL for gestational HTN.  Labor: Induction to start with cytotec, FB.  -- pain control: desires epidural, fentanyl now  Fetal Wellbeing: EFW 7lbs  by Leopold's. Cephalic by BSUS.  -- GBS (positive) - pcn -- continuous fetal monitoring - category I   Postpartum Planning -- bottle/BTL (consent signed 9/9) -- RI/[x] Tdap  -- Rh (-) plan for RhoGAM administration postpartum   Vandora Jaskulski S. Earlene Plater, DO OB/GYN Fellow

## 2018-07-22 NOTE — MAU Note (Signed)
Pt here with pelvic pressure and hemorrhoids. Pt reports some irregular contractions all day. Pt denies LOF or vaginal bleeding. Reports good fetal movement. Cervix was 2cm on last exam

## 2018-07-22 NOTE — MAU Note (Signed)
Pt has 3 thrombosed hemmoroids. Ice pack applied and called MD for lidocaine order..Marland Kitchen

## 2018-07-23 ENCOUNTER — Inpatient Hospital Stay (HOSPITAL_COMMUNITY): Payer: Medicaid Other | Admitting: Certified Registered Nurse Anesthetist

## 2018-07-23 ENCOUNTER — Encounter (HOSPITAL_COMMUNITY)
Admission: AD | Disposition: A | Discharge status: Home / Self Care | Payer: Self-pay | Source: Home / Self Care | Attending: Obstetrics & Gynecology

## 2018-07-23 ENCOUNTER — Encounter (HOSPITAL_COMMUNITY): Payer: Self-pay | Admitting: Certified Registered Nurse Anesthetist

## 2018-07-23 ENCOUNTER — Observation Stay (HOSPITAL_COMMUNITY): Payer: Medicaid Other | Admitting: Anesthesiology

## 2018-07-23 DIAGNOSIS — Z3A40 40 weeks gestation of pregnancy: Secondary | ICD-10-CM

## 2018-07-23 DIAGNOSIS — O480 Post-term pregnancy: Secondary | ICD-10-CM

## 2018-07-23 DIAGNOSIS — O134 Gestational [pregnancy-induced] hypertension without significant proteinuria, complicating childbirth: Secondary | ICD-10-CM

## 2018-07-23 DIAGNOSIS — Z302 Encounter for sterilization: Secondary | ICD-10-CM

## 2018-07-23 HISTORY — PX: TUBAL LIGATION: SHX77

## 2018-07-23 LAB — GLUCOSE, CAPILLARY
GLUCOSE-CAPILLARY: 75 mg/dL (ref 70–99)
Glucose-Capillary: 63 mg/dL — ABNORMAL LOW (ref 70–99)

## 2018-07-23 LAB — RPR: RPR Ser Ql: NONREACTIVE

## 2018-07-23 SURGERY — LIGATION, FALLOPIAN TUBE, POSTPARTUM
Anesthesia: Epidural | Site: Abdomen | Laterality: Bilateral | Wound class: Clean Contaminated

## 2018-07-23 MED ORDER — COCONUT OIL OIL: 1.0000 "application " | TOPICAL_OIL | Status: DC | PRN

## 2018-07-23 MED ORDER — MEASLES, MUMPS & RUBELLA VAC IJ SOLR
0.5000 mL | Freq: Once | INTRAMUSCULAR | Status: DC
  Filled 2018-07-23: qty 0.5

## 2018-07-23 MED ORDER — LACTATED RINGERS IV SOLN
500.0000 mL | Freq: Once | INTRAVENOUS | Status: AC
  Administered 2018-07-23: 500 mL via INTRAVENOUS

## 2018-07-23 MED ORDER — EPHEDRINE 5 MG/ML INJ
10.0000 mg | INTRAVENOUS | Status: DC | PRN
  Filled 2018-07-23: qty 2

## 2018-07-23 MED ORDER — PHENYLEPHRINE 40 MCG/ML (10ML) SYRINGE FOR IV PUSH (FOR BLOOD PRESSURE SUPPORT)
80.0000 ug | PREFILLED_SYRINGE | INTRAVENOUS | Status: DC | PRN
  Filled 2018-07-23: qty 5
  Filled 2018-07-23: qty 10

## 2018-07-23 MED ORDER — DIPHENHYDRAMINE HCL 25 MG PO CAPS: 25.0000 mg | ORAL_CAPSULE | Freq: Four times a day (QID) | ORAL | Status: DC | PRN

## 2018-07-23 MED ORDER — FAMOTIDINE 20 MG PO TABS
40.0000 mg | ORAL_TABLET | Freq: Once | ORAL | Status: AC
  Administered 2018-07-23: 40 mg via ORAL
  Filled 2018-07-23 (×2): qty 2

## 2018-07-23 MED ORDER — LIDOCAINE-EPINEPHRINE (PF) 2 %-1:200000 IJ SOLN
INTRAMUSCULAR | Status: AC
  Filled 2018-07-23: qty 20

## 2018-07-23 MED ORDER — LIDOCAINE HCL (PF) 1 % IJ SOLN
INTRAMUSCULAR | Status: DC | PRN
  Administered 2018-07-23: 6 mL via EPIDURAL
  Administered 2018-07-23: 4 mL via EPIDURAL

## 2018-07-23 MED ORDER — OXYCODONE-ACETAMINOPHEN 5-325 MG PO TABS
1.0000 | ORAL_TABLET | ORAL | Status: DC | PRN
  Administered 2018-07-23: 2 via ORAL
  Administered 2018-07-24 (×2): 1 via ORAL
  Administered 2018-07-24: 2 via ORAL
  Filled 2018-07-23 (×2): qty 2
  Filled 2018-07-23: qty 1
  Filled 2018-07-23: qty 2
  Filled 2018-07-23: qty 1

## 2018-07-23 MED ORDER — ZOLPIDEM TARTRATE 5 MG PO TABS: 5.0000 mg | ORAL_TABLET | Freq: Every evening | ORAL | Status: DC | PRN

## 2018-07-23 MED ORDER — TETANUS-DIPHTH-ACELL PERTUSSIS 5-2.5-18.5 LF-MCG/0.5 IM SUSP: 0.5000 mL | Freq: Once | INTRAMUSCULAR | Status: DC

## 2018-07-23 MED ORDER — MEPERIDINE HCL 25 MG/ML IJ SOLN: 6.2500 mg | INTRAMUSCULAR | Status: DC | PRN

## 2018-07-23 MED ORDER — DIBUCAINE 1 % RE OINT: 1.0000 "application " | TOPICAL_OINTMENT | RECTAL | Status: DC | PRN

## 2018-07-23 MED ORDER — MIDAZOLAM HCL 2 MG/2ML IJ SOLN
INTRAMUSCULAR | Status: AC
  Filled 2018-07-23: qty 2

## 2018-07-23 MED ORDER — WITCH HAZEL-GLYCERIN EX PADS
1.0000 "application " | MEDICATED_PAD | CUTANEOUS | Status: DC | PRN
  Administered 2018-07-23: 1 via TOPICAL

## 2018-07-23 MED ORDER — PRAMOXINE HCL 1 % RE FOAM
Freq: Three times a day (TID) | RECTAL | Status: DC | PRN
  Filled 2018-07-23: qty 15

## 2018-07-23 MED ORDER — HYDROMORPHONE HCL 1 MG/ML IJ SOLN
INTRAMUSCULAR | Status: AC
  Filled 2018-07-23: qty 0.5

## 2018-07-23 MED ORDER — LACTATED RINGERS IV SOLN
INTRAVENOUS | Status: DC
  Administered 2018-07-23: 20 mL/h via INTRAVENOUS

## 2018-07-23 MED ORDER — METOCLOPRAMIDE HCL 10 MG PO TABS
10.0000 mg | ORAL_TABLET | Freq: Once | ORAL | Status: AC
  Administered 2018-07-23: 10 mg via ORAL
  Filled 2018-07-23: qty 1

## 2018-07-23 MED ORDER — FENTANYL CITRATE (PF) 100 MCG/2ML IJ SOLN
INTRAMUSCULAR | Status: AC
  Filled 2018-07-23: qty 2

## 2018-07-23 MED ORDER — ONDANSETRON HCL 4 MG/2ML IJ SOLN: 4.0000 mg | INTRAMUSCULAR | Status: DC | PRN

## 2018-07-23 MED ORDER — DIPHENHYDRAMINE HCL 50 MG/ML IJ SOLN: 12.5000 mg | INTRAMUSCULAR | Status: DC | PRN

## 2018-07-23 MED ORDER — SIMETHICONE 80 MG PO CHEW: 80.0000 mg | CHEWABLE_TABLET | ORAL | Status: DC | PRN

## 2018-07-23 MED ORDER — BUPIVACAINE HCL (PF) 0.25 % IJ SOLN
INTRAMUSCULAR | Status: DC | PRN
  Administered 2018-07-23: 30 mL

## 2018-07-23 MED ORDER — KETOROLAC TROMETHAMINE 30 MG/ML IJ SOLN
INTRAMUSCULAR | Status: DC | PRN
  Administered 2018-07-23: 30 mg via INTRAVENOUS

## 2018-07-23 MED ORDER — PROPOFOL 10 MG/ML IV BOLUS
INTRAVENOUS | Status: DC | PRN
  Administered 2018-07-23 (×5): 20 mg via INTRAVENOUS
  Administered 2018-07-23: 30 mg via INTRAVENOUS

## 2018-07-23 MED ORDER — ERYTHROMYCIN 5 MG/GM OP OINT
TOPICAL_OINTMENT | OPHTHALMIC | Status: AC
  Filled 2018-07-23: qty 1

## 2018-07-23 MED ORDER — HYDROMORPHONE HCL 1 MG/ML IJ SOLN
0.2500 mg | INTRAMUSCULAR | Status: DC | PRN
  Administered 2018-07-23: 0.5 mg via INTRAVENOUS

## 2018-07-23 MED ORDER — BENZOCAINE-MENTHOL 20-0.5 % EX AERO
1.0000 "application " | INHALATION_SPRAY | CUTANEOUS | Status: DC | PRN
  Administered 2018-07-23: 1 via TOPICAL
  Filled 2018-07-23: qty 56

## 2018-07-23 MED ORDER — BUPIVACAINE HCL (PF) 0.25 % IJ SOLN
INTRAMUSCULAR | Status: AC
  Filled 2018-07-23: qty 30

## 2018-07-23 MED ORDER — ACETAMINOPHEN 325 MG PO TABS: 650.0000 mg | ORAL_TABLET | ORAL | Status: DC | PRN

## 2018-07-23 MED ORDER — OXYTOCIN 40 UNITS IN LACTATED RINGERS INFUSION - SIMPLE MED
1.0000 m[IU]/min | INTRAVENOUS | Status: DC
  Administered 2018-07-23: 2 m[IU]/min via INTRAVENOUS

## 2018-07-23 MED ORDER — PRENATAL MULTIVITAMIN CH
1.0000 | ORAL_TABLET | Freq: Every day | ORAL | Status: DC
  Administered 2018-07-24: 1 via ORAL
  Filled 2018-07-23: qty 1

## 2018-07-23 MED ORDER — SENNOSIDES-DOCUSATE SODIUM 8.6-50 MG PO TABS
2.0000 | ORAL_TABLET | ORAL | Status: DC
  Administered 2018-07-23: 2 via ORAL
  Filled 2018-07-23: qty 2

## 2018-07-23 MED ORDER — KETOROLAC TROMETHAMINE 30 MG/ML IJ SOLN
INTRAMUSCULAR | Status: AC
  Filled 2018-07-23: qty 1

## 2018-07-23 MED ORDER — OXYCODONE-ACETAMINOPHEN 5-325 MG PO TABS
1.0000 | ORAL_TABLET | Freq: Four times a day (QID) | ORAL | Status: DC | PRN
  Administered 2018-07-23: 2 via ORAL
  Filled 2018-07-23: qty 2

## 2018-07-23 MED ORDER — IBUPROFEN 600 MG PO TABS
600.0000 mg | ORAL_TABLET | Freq: Four times a day (QID) | ORAL | Status: DC
  Administered 2018-07-23 – 2018-07-24 (×5): 600 mg via ORAL
  Filled 2018-07-23 (×5): qty 1

## 2018-07-23 MED ORDER — SODIUM CHLORIDE 0.9 % IR SOLN
Status: DC | PRN
  Administered 2018-07-23: 1

## 2018-07-23 MED ORDER — MISOPROSTOL 50MCG HALF TABLET
50.0000 ug | ORAL_TABLET | ORAL | Status: DC | PRN
  Filled 2018-07-23: qty 1

## 2018-07-23 MED ORDER — MIDAZOLAM HCL 5 MG/5ML IJ SOLN
INTRAMUSCULAR | Status: DC | PRN
  Administered 2018-07-23 (×2): 1 mg via INTRAVENOUS

## 2018-07-23 MED ORDER — ONDANSETRON HCL 4 MG PO TABS: 4.0000 mg | ORAL_TABLET | ORAL | Status: DC | PRN

## 2018-07-23 MED ORDER — PHENYLEPHRINE 40 MCG/ML (10ML) SYRINGE FOR IV PUSH (FOR BLOOD PRESSURE SUPPORT)
80.0000 ug | PREFILLED_SYRINGE | INTRAVENOUS | Status: DC | PRN
  Filled 2018-07-23: qty 5

## 2018-07-23 MED ORDER — FENTANYL 2.5 MCG/ML BUPIVACAINE 1/10 % EPIDURAL INFUSION (WH - ANES)
14.0000 mL/h | INTRAMUSCULAR | Status: DC | PRN
  Administered 2018-07-23: 14 mL/h via EPIDURAL
  Filled 2018-07-23: qty 100

## 2018-07-23 MED ORDER — SODIUM BICARBONATE 8.4 % IV SOLN
INTRAVENOUS | Status: DC | PRN
  Administered 2018-07-23: 3 mL via EPIDURAL
  Administered 2018-07-23: 2 mL via EPIDURAL
  Administered 2018-07-23: 3 mL via EPIDURAL
  Administered 2018-07-23: 4 mL via EPIDURAL
  Administered 2018-07-23 (×2): 3 mL via EPIDURAL

## 2018-07-23 MED ORDER — METOCLOPRAMIDE HCL 5 MG/ML IJ SOLN: 10.0000 mg | Freq: Once | INTRAMUSCULAR | Status: DC | PRN

## 2018-07-23 MED ORDER — SODIUM BICARBONATE 8.4 % IV SOLN
INTRAVENOUS | Status: AC
  Filled 2018-07-23: qty 50

## 2018-07-23 SURGICAL SUPPLY — 25 items
ADH SKN CLS APL DERMABOND .7 (GAUZE/BANDAGES/DRESSINGS) ×1
CLIP FILSHIE TUBAL LIGA STRL (Clip) ×2 IMPLANT
CLOTH BEACON ORANGE TIMEOUT ST (SAFETY) ×2 IMPLANT
DERMABOND ADVANCED (GAUZE/BANDAGES/DRESSINGS) ×1
DERMABOND ADVANCED .7 DNX12 (GAUZE/BANDAGES/DRESSINGS) IMPLANT
DRSG OPSITE POSTOP 3X4 (GAUZE/BANDAGES/DRESSINGS) ×2 IMPLANT
DURAPREP 26ML APPLICATOR (WOUND CARE) ×2 IMPLANT
GLOVE BIO SURGEON STRL SZ7.5 (GLOVE) ×2 IMPLANT
GLOVE BIOGEL PI IND STRL 7.0 (GLOVE) ×2 IMPLANT
GLOVE BIOGEL PI INDICATOR 7.0 (GLOVE) ×2
GOWN STRL REUS W/TWL LRG LVL3 (GOWN DISPOSABLE) ×2 IMPLANT
GOWN STRL REUS W/TWL XL LVL3 (GOWN DISPOSABLE) ×2 IMPLANT
NEEDLE HYPO 22GX1.5 SAFETY (NEEDLE) ×3 IMPLANT
NS IRRIG 1000ML POUR BTL (IV SOLUTION) ×2 IMPLANT
PACK ABDOMINAL MINOR (CUSTOM PROCEDURE TRAY) ×2 IMPLANT
PROTECTOR NERVE ULNAR (MISCELLANEOUS) ×2 IMPLANT
SPONGE LAP 4X18 RFD (DISPOSABLE) IMPLANT
SUT MNCRL AB 4-0 PS2 18 (SUTURE) ×2 IMPLANT
SUT PLAIN 0 NONE (SUTURE) ×2 IMPLANT
SUT VIC AB 0 CT1 27 (SUTURE) ×2
SUT VIC AB 0 CT1 27XBRD ANBCTR (SUTURE) ×1 IMPLANT
SYR CONTROL 10ML LL (SYRINGE) ×3 IMPLANT
TOWEL OR 17X24 6PK STRL BLUE (TOWEL DISPOSABLE) ×4 IMPLANT
TRAY FOLEY CATH SILVER 14FR (SET/KITS/TRAYS/PACK) ×2 IMPLANT
WATER STERILE IRR 1000ML POUR (IV SOLUTION) ×2 IMPLANT

## 2018-07-23 NOTE — Anesthesia Procedure Notes (Signed)
Epidural Patient location during procedure: OB Start time: 07/23/2018 2:03 AM End time: 07/23/2018 2:10 AM  Staffing Anesthesiologist: Beryle LatheBrock, Thomas E, MD Performed: anesthesiologist   Preanesthetic Checklist Completed: patient identified, pre-op evaluation, timeout performed, IV checked, risks and benefits discussed and monitors and equipment checked  Epidural Patient position: sitting Prep: DuraPrep Patient monitoring: continuous pulse ox and blood pressure Approach: midline Location: L2-L3 Injection technique: LOR saline  Needle:  Needle type: Tuohy  Needle gauge: 17 G Needle length: 9 cm Needle insertion depth: 9 cm Catheter size: 19 Gauge Catheter at skin depth: 14 cm Test dose: negative and Other (1% lidocaine)  Assessment Events: blood not aspirated and negative IV test  Additional Notes Patient identified. Risks including, but not limited to, bleeding, infection, nerve damage, paralysis, inadequate analgesia, blood pressure changes, nausea, vomiting, allergic reaction, postpartum back pain, itching, and headache were discussed. Patient expressed understanding and wished to proceed. Sterile prep and drape, including hand hygiene, mask, and sterile gloves were used. The patient was positioned and the spine was prepped. The skin was anesthetized with lidocaine. No paraesthesia or other complication noted. The patient did not experience any signs of intravascular injection such as tinnitus or metallic taste in mouth, nor signs of intrathecal spread such as rapid motor block. Please see nursing notes for vital signs. The patient tolerated the procedure well.   Leslye Peerhomas Brock, MDReason for block:procedure for pain

## 2018-07-23 NOTE — Progress Notes (Signed)
OB/GYN Faculty Practice: Labor Progress Note  Subjective: Doing well, sleeping in room on arrival, epidural in place. States only some vaginal pressure but otherwise much more comfortable.  Objective: BP 115/74   Pulse 100   Temp 97.8 F (36.6 C) (Oral)   Resp 18   Ht 5\' 5"  (1.651 m)   Wt 98 kg   LMP 10/15/2017   SpO2 100%   BMI 35.94 kg/m  Gen: sleeping, lying on right side Dilation: 5 Effacement (%): 70 Cervical Position: Posterior Station: Ballotable Presentation: Vertex Exam by:: Dr. Earlene Juarez  Assessment and Plan: 22 y.o. G2P1001 8850w1d here for IOL for gHTN, term.   Labor: Induction with cytotec x 1, FB out. Cervix now well-effaced, BBOW. Will switch to pitocin. -- pain control: epidural in place -- PPH Risk: low  GHTN: BP mostly wnl normal range, occasional moderate range BP. UPC 0.13, HELLP labs wnl.  -- continue to closely monitor BP   Fetal Wellbeing: EFW 7lbs  by Leopold's. Cephalic by BSUS.  -- GBS (positive) - pcn -- continuous fetal monitoring - category I - occasional variables, subtle decelerations resolved with repositioning or spontaneously, maintains excellent variability  Annette Amaker S. Earlene PlaterWallace, DO OB/GYN Fellow, Faculty Practice  3:42 AM

## 2018-07-23 NOTE — Anesthesia Postprocedure Evaluation (Signed)
Anesthesia Post Note  Patient: Annette Juarez  Procedure(s) Performed: POST PARTUM TUBAL LIGATION (Bilateral Abdomen)     Patient location during evaluation: PACU Anesthesia Type: Epidural Level of consciousness: oriented and awake and alert Pain management: pain level controlled Vital Signs Assessment: post-procedure vital signs reviewed and stable Respiratory status: spontaneous breathing, nonlabored ventilation and respiratory function stable Cardiovascular status: blood pressure returned to baseline and stable Postop Assessment: no apparent nausea or vomiting Anesthetic complications: no    Last Vitals:  Vitals:   07/23/18 1347 07/23/18 1404  BP: (!) 141/94 132/88  Pulse: 79 70  Resp: 18 18  Temp: 36.4 C   SpO2:  100%    Last Pain:  Vitals:   07/23/18 1515  TempSrc:   PainSc: 2    Pain Goal: Patients Stated Pain Goal: 3 (07/23/18 1515)               Hephzibah Strehle A.

## 2018-07-23 NOTE — Progress Notes (Signed)
1255 CBG in PACU 63

## 2018-07-23 NOTE — Anesthesia Preprocedure Evaluation (Signed)
Anesthesia Evaluation  Patient identified by MRN, date of birth, ID band Patient awake    Reviewed: Allergy & Precautions, NPO status , Patient's Chart, lab work & pertinent test results  History of Anesthesia Complications Negative for: history of anesthetic complications  Airway Mallampati: II  TM Distance: >3 FB Neck ROM: Full    Dental   Pulmonary neg pulmonary ROS,    breath sounds clear to auscultation       Cardiovascular hypertension (Gestational),  Rhythm:Regular Rate:Normal     Neuro/Psych negative neurological ROS  negative psych ROS   GI/Hepatic Neg liver ROS, GERD  Controlled,  Endo/Other  diabetes, Well Controlled, Gestational Obesity   Renal/GU negative Renal ROS     Musculoskeletal negative musculoskeletal ROS (+)   Abdominal   Peds  Hematology  (+) anemia ,   Anesthesia Other Findings   Reproductive/Obstetrics                             Anesthesia Physical Anesthesia Plan  ASA: II  Anesthesia Plan: Epidural   Post-op Pain Management:    Induction:   PONV Risk Score and Plan: 2 and Treatment may vary due to age or medical condition  Airway Management Planned: Natural Airway  Additional Equipment: None  Intra-op Plan:   Post-operative Plan:   Informed Consent: I have reviewed the patients History and Physical, chart, labs and discussed the procedure including the risks, benefits and alternatives for the proposed anesthesia with the patient or authorized representative who has indicated his/her understanding and acceptance.     Plan Discussed with: Anesthesiologist  Anesthesia Plan Comments: (Labs reviewed. Platelets acceptable, patient not taking any blood thinning medications. Per RN, FHR tracing reported to be stable enough for sitting procedure. Risks and benefits discussed with patient, including PDPH, backache, epidural hematoma, failed epidural,  allergic reaction, and nerve injury. Patient expressed understanding and wished to proceed.)        Anesthesia Quick Evaluation

## 2018-07-23 NOTE — Progress Notes (Signed)
Patient desires bilateral tubal sterilization.  Other reversible forms of contraception were discussed with patient; she declines all other modalities. Discussed bilateral tubal sterilization in detail; discussed options of laparoscopic bilateral tubal sterilization using Filshie clips vs laparoscopic bilateral salpingectomy. Risks and benefits discussed in detail including but not limited to: risk of regret, permanence of method, bleeding, infection, injury to surrounding organs and need for additional procedures.  Failure risk of 1-2 % for Filshie clips and <1% for bilateral salpingectomy with increased risk of ectopic gestation if pregnancy occurs was also discussed with patient.  Also discussed possible reduction of risk of ovarian cancer via bilateral salpingectomy given that a growing body of knowledge reveals that the majority of cases of high grade serous "ovarian" cancer actually are actually  cancers arising from the fimbriated end of the fallopian tubes. Emphasized that removal of fallopian tubes do not result in any known hormonal imbalance.  Patient verbalized understanding of these risks and benefits and wants to proceed with sterilization with laparoscopic bilateral sterilization using Filshie clips.     Medicaid papers have been signed.   Nettie ElmMichael Jahking Lesser. MD

## 2018-07-23 NOTE — Lactation Note (Signed)
This note was copied from a baby's chart. Lactation Consultation Note  Patient Name: Annette Annette Omanaris Haugan [id number]'s Date: 07/23/2018 Reason for consult: Initial assessment;Term  P2 mother whose infant is now 2916 hours old.  Mother had a BTL today and, until recently, has been very sleepy.  She breast fed her first child (who is now 23 year old) for 2 months.  She plans to breast feed this baby for the same amount of time before returning to work.  Mother was awake and holding baby when I arrived.  Baby was sleeping and not showing feeding cues.  Mother stated that she would like to breast/bottle feed and that her baby "does not like formula."  Baby spit formula out when mother attempted to feed earlier today.  Education completed related to feeding cues, STS, how to awaken a sleepy baby, hand expression and how to keep baby awake at the breast when feeding.  Encouraged mother to call her RN/LC for assistance with latching.  Colostrum container provided for any EBM she may obtain with hand expression.  Finger feeding demonstrated.    Mom made aware of O/P services, breastfeeding support groups, community resources, and our phone # for post-discharge questions. Father and visitor present.     Maternal Data Formula Feeding for Exclusion: No Has patient been taught Hand Expression?: Yes Does the patient have breastfeeding experience prior to this delivery?: Yes  Feeding    LATCH Score                   Interventions    Lactation Tools Discussed/Used WIC Program: Yes   Consult Status Consult Status: Follow-up Date: 07/24/18 Follow-up type: In-patient    Dora SimsBeth R Reka Wist 07/23/2018, 9:54 PM

## 2018-07-23 NOTE — Transfer of Care (Signed)
Immediate Anesthesia Transfer of Care Note  Patient: Annette Juarez  Procedure(s) Performed: POST PARTUM TUBAL LIGATION (Bilateral Abdomen)  Patient Location: PACU  Anesthesia Type:Epidural  Level of Consciousness: oriented, drowsy and patient cooperative  Airway & Oxygen Therapy: Patient Spontanous Breathing  Post-op Assessment: Report given to RN and Post -op Vital signs reviewed and stable  Post vital signs: Reviewed and stable  Last Vitals:  Vitals Value Taken Time  BP    Temp    Pulse    Resp    SpO2      Last Pain:  Vitals:   07/23/18 1033  TempSrc: Oral  PainSc:          Complications: No apparent anesthesia complications

## 2018-07-23 NOTE — Anesthesia Preprocedure Evaluation (Signed)
Anesthesia Evaluation  Patient identified by MRN, date of birth, ID band Patient awake    Reviewed: Allergy & Precautions, NPO status , Patient's Chart, lab work & pertinent test results  Airway Mallampati: II  TM Distance: >3 FB Neck ROM: Full    Dental no notable dental hx. (+) Teeth Intact   Pulmonary neg pulmonary ROS,    Pulmonary exam normal breath sounds clear to auscultation       Cardiovascular hypertension, Normal cardiovascular exam Rhythm:Regular Rate:Normal  Hx/o Gestational HTN   Neuro/Psych negative neurological ROS  negative psych ROS   GI/Hepatic Neg liver ROS, GERD  Medicated and Controlled,  Endo/Other  diabetes, Well Controlled, GestationalObesity  Renal/GU negative Renal ROS  negative genitourinary   Musculoskeletal negative musculoskeletal ROS (+)   Abdominal (+) + obese,   Peds  Hematology  (+) anemia ,   Anesthesia Other Findings   Reproductive/Obstetrics Desires sterilization post partum                             Anesthesia Physical Anesthesia Plan  ASA: II  Anesthesia Plan: Epidural   Post-op Pain Management:    Induction:   PONV Risk Score and Plan: 4 or greater and Scopolamine patch - Pre-op, Ondansetron, Dexamethasone and Treatment may vary due to age or medical condition  Airway Management Planned: Natural Airway  Additional Equipment:   Intra-op Plan:   Post-operative Plan:   Informed Consent: I have reviewed the patients History and Physical, chart, labs and discussed the procedure including the risks, benefits and alternatives for the proposed anesthesia with the patient or authorized representative who has indicated his/her understanding and acceptance.   Dental advisory given  Plan Discussed with: CRNA and Surgeon  Anesthesia Plan Comments:         Anesthesia Quick Evaluation

## 2018-07-23 NOTE — Op Note (Signed)
Hubbard Hartshornaris S Dolle 07/22/2018 - 07/23/2018  PREOPERATIVE DIAGNOSES: Multiparity, undesired fertility  POSTOPERATIVE DIAGNOSES: Multiparity, undesired fertility  PROCEDURE:  Postpartum Bilateral Tubal Sterilization using Filshie Clips   SURGEON: Dr.  Nettie ElmMichael Ervin  ANESTHESIA:  Epidural and local analgesia using 30 ml of 0.5% Marcaine  COMPLICATIONS:  None immediate.  ESTIMATED BLOOD LOSS: < 10 cc  FLUIDS: As recorded  URINE OUTPUT:  As recorded  INDICATIONS:  22 y.o. [id number] with undesired fertility,status post vaginal delivery, desires permanent sterilization.  Other reversible forms of contraception were discussed with patient; she declines all other modalities. Risks of procedure discussed with patient including but not limited to: risk of regret, permanence of method, bleeding, infection, injury to surrounding organs and need for additional procedures.  Failure risk of 1 -2 % with increased risk of ectopic gestation if pregnancy occurs was also discussed with patient.      FINDINGS:  Normal uterus, tubes, and ovaries.  PROCEDURE DETAILS: The patient was taken to the operating room where her epidural anesthesia was dosed up to surgical level and found to be adequate.  She was then placed in the dorsal supine position and prepped and draped in sterile fashion.  After an adequate timeout was performed, attention was turned to the patient's abdomen where a small transverse skin incision was made under the umbilical fold. The incision was taken down to the layer of fascia using the scalpel, and fascia was incised, and extended bilaterally using Mayo scissors. The peritoneum was entered in a sharp fashion. Attention was then turned to the patient's uterus, and left fallopian tube was identified and followed out to the fimbriated end.  A Filshie clip was placed on the left fallopian tube about 3 cm from the cornual attachment, with care given to incorporate the underlying mesosalpinx.  A similar  process was carried out on the right side allowing for bilateral tubal sterilization.  Good hemostasis was noted overall.  The instruments were then removed from the patient's abdomen and the fascial incision was repaired with 0 Vicryl, and the skin was closed with a 4-0 Vicryl subcuticular stitch. 30 cc local injected. The patient tolerated the procedure well.  Instrument, sponge, and needle counts were correct times two.  The patient was then taken to the recovery room awake and in stable condition.    Nettie ElmMichael Ervin, MD, FACOG Attending Obstetrician & Gynecologist Faculty Practice, [facility] [place]

## 2018-07-24 ENCOUNTER — Encounter (HOSPITAL_COMMUNITY): Payer: Self-pay

## 2018-07-24 MED ORDER — RHO D IMMUNE GLOBULIN 1500 UNIT/2ML IJ SOSY
300.0000 ug | PREFILLED_SYRINGE | Freq: Once | INTRAMUSCULAR | Status: AC
  Administered 2018-07-24: 300 ug via INTRAVENOUS
  Filled 2018-07-24: qty 2

## 2018-07-24 MED ORDER — IBUPROFEN 800 MG PO TABS: 800.0000 mg | ORAL_TABLET | Freq: Three times a day (TID) | ORAL | 0 refills | Status: DC | PRN

## 2018-07-24 MED ORDER — OXYCODONE HCL 5 MG PO TABS: 5.0000 mg | ORAL_TABLET | Freq: Four times a day (QID) | ORAL | 0 refills | Status: DC | PRN

## 2018-07-24 MED ORDER — RHO D IMMUNE GLOBULIN 1500 UNIT/2ML IJ SOSY
300.0000 ug | PREFILLED_SYRINGE | Freq: Once | INTRAMUSCULAR | Status: DC
  Filled 2018-07-24: qty 2

## 2018-07-24 MED ORDER — ACETAMINOPHEN 325 MG PO TABS: 650.0000 mg | ORAL_TABLET | ORAL | 2 refills | Status: DC | PRN

## 2018-07-24 NOTE — Lactation Note (Signed)
This note was copied from a baby'Juarez chart. Lactation Consultation Note  Patient Name: Annette Chipper Omanaris Vrba WUJWJ'XToday'Juarez Date: 07/24/2018 Reason for consult: Follow-up assessment;Term Baby is 2529 hours old.  Mom states baby is latching well to breast.  Mom states she was wanting baby to take formula in addition because she was only planning on breastfeeding for 2 months.  She will return to work at that time.  Mom has tried different nipples and a syringe to give formula.  She states baby spits up formula or won't suck on bottle.  It has been 5 hours since the last feeding.  Baby is showing feeding cues.  Encouraged mom to put baby to breast.  After a few attempts baby latched well in football hold.  Mom recently pumped 15 mls with manual pump.  Discussed holding off on formula and exclusively breastfeeding for now.  Mom has a manual pump.  Reviewed EBM storage.  Discussed milk coming to volume and the prevention and treatment of engorgement.  Lactation outpatient services and support reviewed and encouraged prn.  Maternal Data    Feeding Feeding Type: Breast Fed  LATCH Score Latch: Grasps breast easily, tongue down, lips flanged, rhythmical sucking.  Audible Swallowing: Spontaneous and intermittent  Type of Nipple: Everted at rest and after stimulation  Comfort (Breast/Nipple): Soft / non-tender  Hold (Positioning): Assistance needed to correctly position infant at breast and maintain latch.  LATCH Score: 9  Interventions Interventions: Assisted with latch  Lactation Tools Discussed/Used     Consult Status Consult Status: Complete Follow-up type: Call as needed    Huston FoleyMOULDEN, Annette Juarez 07/24/2018, 10:50 AM

## 2018-07-24 NOTE — Addendum Note (Signed)
Addendum  created 07/24/18 [id number] by Yolonda Kidaarver, Felisa Zechman L, CRNA   Sign clinical note

## 2018-07-24 NOTE — Anesthesia Postprocedure Evaluation (Signed)
Anesthesia Post Note  Patient: Annette Juarez  Procedure(s) Performed: AN AD HOC LABOR EPIDURAL     Patient location during evaluation: PACU Anesthesia Type: Epidural Level of consciousness: awake and alert Pain management: pain level controlled Vital Signs Assessment: post-procedure vital signs reviewed and stable Respiratory status: spontaneous breathing, respiratory function stable and nonlabored ventilation Cardiovascular status: blood pressure returned to baseline Postop Assessment: epidural receding and no apparent nausea or vomiting Anesthetic complications: no    Last Vitals:  Vitals:   07/23/18 2252 07/24/18 0300  BP: 116/81 129/78  Pulse: 75 86  Resp: 18 18  Temp: 36.7 C 36.9 C  SpO2: 100% 100%                 Beryle Lathehomas E Brock

## 2018-07-24 NOTE — Anesthesia Postprocedure Evaluation (Signed)
Anesthesia Post Note  Patient: Annette Juarez  Procedure(s) Performed: AN AD HOC LABOR EPIDURAL     Patient location during evaluation: Mother Baby Anesthesia Type: Epidural Level of consciousness: awake, awake and alert, oriented and patient cooperative Pain management: pain level controlled Vital Signs Assessment: post-procedure vital signs reviewed and stable Respiratory status: spontaneous breathing, respiratory function stable and nonlabored ventilation Cardiovascular status: stable Postop Assessment: no headache, no backache, patient able to bend at knees, no apparent nausea or vomiting and able to ambulate Anesthetic complications: no    Last Vitals:  Vitals:   07/23/18 2252 07/24/18 0300  BP: 116/81 129/78  Pulse: 75 86  Resp: 18 18  Temp: 36.7 C 36.9 C  SpO2: 100% 100%    Last Pain:  Vitals:   07/24/18 0546  TempSrc:   PainSc: 8    Pain Goal: Patients Stated Pain Goal: 3 (07/23/18 1853)               Lorrinda Ramstad L

## 2018-07-24 NOTE — Discharge Summary (Signed)
OB Discharge Summary     Patient Name: Annette Juarez DOB: Apr 19, 1996 MRN: [id number]  Date of admission: 07/22/2018 Delivering MD: Tamera Stands   Date of discharge: 07/24/2018  Admitting diagnosis: 40 WKS, CTXS Intrauterine pregnancy: [redacted]w[redacted]d     Secondary diagnosis:  Active Problems:   Gestational hypertension  Additional problems: SVD, RH negative      Discharge diagnosis: Term Pregnancy Delivered, Gestational Hypertension and RH negative                                                                                                Post partum procedures:rhogam  Augmentation: Pitocin, Cytotec and Foley Balloon  Complications: None  Hospital course:  Induction of Labor With Vaginal Delivery   22 y.o. yo [id number] at [redacted]w[redacted]d was admitted to the hospital 07/22/2018 for induction of labor.  Indication for induction: Gestational hypertension.  Patient had an uncomplicated labor course as follows: Membrane Rupture Time/Date: 4:40 AM ,07/23/2018   Intrapartum Procedures: Episiotomy: None [1]                                         Lacerations:  None [1]  Patient had delivery of a Viable infant.  Information for the patient's newborn:  Alijah, Hyde [[id number]]  Delivery Method: Vag-Spont   07/23/2018  Details of delivery can be found in separate delivery note.  Patient had a routine postpartum course. Patient is discharged home 07/24/18.  Physical exam  Vitals:   07/23/18 1404 07/23/18 1853 07/23/18 2252 07/24/18 0300  BP: 132/88 128/84 116/81 129/78  Pulse: 70 86 75 86  Resp: 18 18 18 18   Temp:  98.2 F (36.8 C) 98 F (36.7 C) 98.5 F (36.9 C)  TempSrc:  Axillary Oral Oral  SpO2: 100%  100% 100%  Weight:      Height:       General: alert, cooperative and no distress Lochia: appropriate Uterine Fundus: firm Incision: Healing well with no significant drainage, Dressing is clean, dry, and intact DVT Evaluation: No evidence of DVT seen on physical exam. No  significant calf/ankle edema. Labs: Lab Results  Component Value Date   WBC 9.1 07/22/2018   HGB 10.3 (L) 07/22/2018   HCT 30.9 (L) 07/22/2018   MCV 96.9 07/22/2018   PLT 323 07/22/2018   CMP Latest Ref Rng & Units 07/22/2018  Glucose 70 - 99 mg/dL 74  BUN 6 - 20 mg/dL <3(O)  Creatinine 7.56 - 1.00 mg/dL 4.33(I)  Sodium 951 - 884 mmol/L 134(L)  Potassium 3.5 - 5.1 mmol/L 4.1  Chloride 98 - 111 mmol/L 104  CO2 22 - 32 mmol/L 23  Calcium 8.9 - 10.3 mg/dL 8.3(L)  Total Protein 6.5 - 8.1 g/dL 6.0(L)  Total Bilirubin 0.3 - 1.2 mg/dL 0.8  Alkaline Phos 38 - 126 U/L 111  AST 15 - 41 U/L 10(L)  ALT 0 - 44 U/L 7    Discharge instruction: per After Visit Summary and "Baby and Me Booklet".  After  visit meds:  Allergies as of 07/24/2018   No Known Allergies     Medication List    TAKE these medications   VITAFOL-NANO 18-0.6-0.4 MG Tabs Take 1 tablet by mouth daily.       Diet: routine diet  Activity: Advance as tolerated. Pelvic rest for 6 weeks.   Outpatient follow up:1 week BP check and 4 week postpartum appointment Follow up Appt: Future Appointments  Date Time Provider Department Center  07/30/2018  4:15 PM [facility] NURSE [facility]  09/04/2018  3:15 PM Kooistra, Charlesetta GaribaldiKathryn Lorraine, CNM [facility]   Follow up Visit:No follow-ups on file.  Postpartum contraception: Tubal Ligation  Newborn Data: Live born female  Birth Weight: 7 lb 7.2 oz (3379 g) APGAR: 7, 9  Newborn Delivery   Time head delivered:  07/23/2018 05:27:33 Birth date/time:  07/23/2018 05:29:00 Delivery type:  Vaginal, Spontaneous     Baby Feeding: Bottle and Breast Disposition:home with mother   07/24/2018 Sharyon CableVeronica C Allen Egerton, CNM

## 2018-07-24 NOTE — Addendum Note (Signed)
Addendum  created 07/24/18 [id number] by Yolonda Kidaarver, Leota Maka L, CRNA   Charge Capture section accepted, Sign clinical note

## 2018-07-24 NOTE — Anesthesia Postprocedure Evaluation (Signed)
Anesthesia Post Note  Patient: Annette Juarez  Procedure(s) Performed: POST PARTUM TUBAL LIGATION (Bilateral Abdomen)     Patient location during evaluation: Mother Baby Anesthesia Type: Epidural Level of consciousness: awake, awake and alert, oriented and patient cooperative Pain management: pain level controlled Vital Signs Assessment: post-procedure vital signs reviewed and stable Respiratory status: spontaneous breathing, respiratory function stable and nonlabored ventilation Cardiovascular status: stable Postop Assessment: no headache, no backache, patient able to bend at knees, no apparent nausea or vomiting and able to ambulate Anesthetic complications: no    Last Vitals:  Vitals:   07/23/18 2252 07/24/18 0300  BP: 116/81 129/78  Pulse: 75 86  Resp: 18 18  Temp: 36.7 C 36.9 C  SpO2: 100% 100%    Last Pain:  Vitals:   07/24/18 0546  TempSrc:   PainSc: 8    Pain Goal: Patients Stated Pain Goal: 3 (07/23/18 1853)               Markiesha Delia L

## 2018-07-25 ENCOUNTER — Ambulatory Visit: Payer: Self-pay

## 2018-07-25 ENCOUNTER — Telehealth: Payer: Self-pay

## 2018-07-25 LAB — RH IG WORKUP (INCLUDES ABO/RH)
ABO/RH(D): O NEG
FETAL SCREEN: NEGATIVE
Gestational Age(Wks): 40
UNIT DIVISION: 0

## 2018-07-25 MED ORDER — IBUPROFEN 800 MG PO TABS: 800.0000 mg | ORAL_TABLET | Freq: Three times a day (TID) | ORAL | 0 refills | Status: DC | PRN

## 2018-07-25 MED ORDER — ACETAMINOPHEN 325 MG PO TABS: 650.0000 mg | ORAL_TABLET | ORAL | 2 refills | Status: AC | PRN

## 2018-07-25 MED ORDER — OXYCODONE HCL 5 MG PO TABS: 5.0000 mg | ORAL_TABLET | Freq: Four times a day (QID) | ORAL | 0 refills | Status: AC | PRN

## 2018-07-25 NOTE — Lactation Note (Signed)
This note was copied from a baby's chart. Lactation Consultation Note  Patient Name: Annette Juarez ZOXWR'UToday's Date: 07/25/2018 Reason for consult: Follow-up assessment;Term;Infant weight loss P2, 43 hour female infant. Per mom,  she planning to do more formula and less breastfeeding. Per mom, infant is taking formula well without problems. Per mom, she knows how to latch infant to breast and doesn't need any assistance prefers to give formula at this time. LC politely understood and repeated back mom's plan.   Maternal Data    Feeding Feeding Type: Formula Nipple Type: Slow - flow  LATCH Score                   Interventions    Lactation Tools Discussed/Used     Consult Status      Annette Juarez 07/25/2018, 12:53 AM

## 2018-07-25 NOTE — Lactation Note (Addendum)
This note was copied from a baby's chart. Lactation Consultation Note  Patient Name: Annette Juarez Today's Date: 07/25/2018   Follow up with mom prior to d/c home. Mom reports she has no questions/concerns about BF. Infant currently being formula fed via bottle. Mom reports she used hand expression, warm showers and pumping to alleviate engorgement with her last child. Discussed ice and cabbage leaves may also be helpful also if engorged. Mom reports she has a manual pump for home use. Mom aware she can call with any questions/concerns as needed.      Maternal Data    Feeding Feeding Type: Bottle Fed - Formula  LATCH Score                   Interventions    Lactation Tools Discussed/Used     Consult Status      Annette Juarez 07/25/2018, 9:32 AM

## 2018-07-25 NOTE — Telephone Encounter (Signed)
Patient called stating her prescriptions are at the wrong pharmacy. CNM informed patient that she would change the pharmacy. Patient demanding that it be done immediately and wanted to know a time frame. CNM informed patient that she would do them as soon as she could but she is currently providing patient care. Patient became belligerent on the phone and CNM repeatedly told patient that prescriptions would be sent as soon as possible. Patient stating she is coming to hospital and CNM assured her that is not necessary.  RX for medication sent to correct pharmacy and patient called but no answer.  Rolm BookbinderCaroline M Dorina Ribaudo, CNM 07/25/18 1:12 PM

## 2018-07-30 ENCOUNTER — Ambulatory Visit: Payer: Medicaid Other

## 2018-09-04 ENCOUNTER — Ambulatory Visit: Payer: Medicaid Other | Admitting: Student

## 2020-08-25 ENCOUNTER — Other Ambulatory Visit: Payer: Medicaid Other

## 2020-08-27 ENCOUNTER — Emergency Department (HOSPITAL_COMMUNITY): Payer: Medicaid Other

## 2020-08-27 ENCOUNTER — Emergency Department (HOSPITAL_COMMUNITY)
Admission: EM | Admit: 2020-08-27 | Discharge: 2020-08-27 | Disposition: A | Discharge status: Home / Self Care | Payer: Medicaid Other | Attending: Emergency Medicine | Admitting: Emergency Medicine

## 2020-08-27 ENCOUNTER — Encounter (HOSPITAL_COMMUNITY): Payer: Self-pay | Admitting: Emergency Medicine

## 2020-08-27 DIAGNOSIS — R079 Chest pain, unspecified: Secondary | ICD-10-CM | POA: Diagnosis not present

## 2020-08-27 DIAGNOSIS — R059 Cough, unspecified: Secondary | ICD-10-CM | POA: Diagnosis not present

## 2020-08-27 DIAGNOSIS — Z5321 Procedure and treatment not carried out due to patient leaving prior to being seen by health care provider: Secondary | ICD-10-CM | POA: Insufficient documentation

## 2020-08-27 DIAGNOSIS — R519 Headache, unspecified: Secondary | ICD-10-CM | POA: Insufficient documentation

## 2020-08-27 LAB — BASIC METABOLIC PANEL
Anion gap: 9 (ref 5–15)
BUN: 5 mg/dL — ABNORMAL LOW (ref 6–20)
CO2: 28 mmol/L (ref 22–32)
Calcium: 9.3 mg/dL (ref 8.9–10.3)
Chloride: 103 mmol/L (ref 98–111)
Creatinine, Ser: 0.74 mg/dL (ref 0.44–1.00)
GFR, Estimated: >60 mL/min (ref >60)
Glucose, Bld: 106 mg/dL — ABNORMAL HIGH (ref 70–99)
Potassium: 4 mmol/L (ref 3.5–5.1)
Sodium: 140 mmol/L (ref 135–145)

## 2020-08-27 LAB — CBC
HCT: 37.7 % (ref 36.0–46.0)
Hemoglobin: 12.3 g/dL (ref 12.0–15.0)
MCH: 31.9 pg (ref 26.0–34.0)
MCHC: 32.6 g/dL (ref 30.0–36.0)
MCV: 97.9 fL (ref 80.0–100.0)
Platelets: 412 10*3/uL — ABNORMAL HIGH (ref 150–400)
RBC: 3.85 MIL/uL — ABNORMAL LOW (ref 3.87–5.11)
RDW: 11.9 % (ref 11.5–15.5)
WBC: 6.6 10*3/uL (ref 4.0–10.5)
nRBC: 0 % (ref 0.0–0.2)

## 2020-08-27 LAB — I-STAT BETA HCG BLOOD, ED (MC, WL, AP ONLY): I-stat hCG, quantitative: <5 m[IU]/mL (ref <5)

## 2020-08-27 NOTE — ED Notes (Signed)
Pt still not responding to vital recheck °

## 2020-08-27 NOTE — ED Notes (Signed)
Pt not responding to vital check. 

## 2020-08-27 NOTE — ED Triage Notes (Signed)
Pt report chest pain, headache and cough x3 days, denies fevers, chills diarrhea.

## 2021-05-05 ENCOUNTER — Emergency Department (HOSPITAL_COMMUNITY): Payer: Medicaid Other

## 2021-05-05 ENCOUNTER — Other Ambulatory Visit: Payer: Self-pay

## 2021-05-05 ENCOUNTER — Encounter (HOSPITAL_COMMUNITY): Payer: Self-pay

## 2021-05-05 ENCOUNTER — Emergency Department (HOSPITAL_COMMUNITY)
Admission: EM | Admit: 2021-05-05 | Discharge: 2021-05-05 | Disposition: A | Discharge status: Home / Self Care | Payer: Medicaid Other | Attending: Emergency Medicine | Admitting: Emergency Medicine

## 2021-05-05 DIAGNOSIS — M542 Cervicalgia: Secondary | ICD-10-CM | POA: Insufficient documentation

## 2021-05-05 DIAGNOSIS — R0789 Other chest pain: Secondary | ICD-10-CM | POA: Diagnosis not present

## 2021-05-05 DIAGNOSIS — Y92410 Unspecified street and highway as the place of occurrence of the external cause: Secondary | ICD-10-CM | POA: Diagnosis not present

## 2021-05-05 DIAGNOSIS — E119 Type 2 diabetes mellitus without complications: Secondary | ICD-10-CM | POA: Diagnosis not present

## 2021-05-05 MED ORDER — NAPROXEN 500 MG PO TABS: 500.0000 mg | ORAL_TABLET | Freq: Two times a day (BID) | ORAL | 0 refills | Status: DC

## 2021-05-05 MED ORDER — METHOCARBAMOL 500 MG PO TABS: 500.0000 mg | ORAL_TABLET | Freq: Two times a day (BID) | ORAL | 0 refills | Status: DC
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
## 2021-05-05 NOTE — Discharge Instructions (Addendum)
Take the prescribed medication as directed.  Can use warm compresses or heating pad to help as well if you like. Follow-up with your primary care doctor. Return to the ED for new or worsening symptoms.

## 2022-05-31 ENCOUNTER — Ambulatory Visit: Payer: Medicaid Other | Admitting: Family Medicine

## 2022-12-08 ENCOUNTER — Emergency Department (HOSPITAL_COMMUNITY)
Admission: EM | Admit: 2022-12-08 | Discharge: 2022-12-09 | Disposition: A | Discharge status: Home / Self Care | Payer: Medicaid Other | Attending: Emergency Medicine | Admitting: Emergency Medicine

## 2022-12-08 ENCOUNTER — Other Ambulatory Visit: Payer: Self-pay

## 2022-12-08 ENCOUNTER — Encounter (HOSPITAL_COMMUNITY): Payer: Self-pay | Admitting: Emergency Medicine

## 2022-12-08 ENCOUNTER — Emergency Department (HOSPITAL_COMMUNITY): Payer: Medicaid Other

## 2022-12-08 DIAGNOSIS — M791 Myalgia, unspecified site: Secondary | ICD-10-CM | POA: Diagnosis not present

## 2022-12-08 DIAGNOSIS — R519 Headache, unspecified: Secondary | ICD-10-CM | POA: Insufficient documentation

## 2022-12-08 DIAGNOSIS — R6883 Chills (without fever): Secondary | ICD-10-CM | POA: Insufficient documentation

## 2022-12-08 DIAGNOSIS — E876 Hypokalemia: Secondary | ICD-10-CM | POA: Insufficient documentation

## 2022-12-08 DIAGNOSIS — R112 Nausea with vomiting, unspecified: Secondary | ICD-10-CM | POA: Diagnosis present

## 2022-12-08 DIAGNOSIS — Z1152 Encounter for screening for COVID-19: Secondary | ICD-10-CM | POA: Insufficient documentation

## 2022-12-08 DIAGNOSIS — E119 Type 2 diabetes mellitus without complications: Secondary | ICD-10-CM | POA: Insufficient documentation

## 2022-12-08 DIAGNOSIS — R251 Tremor, unspecified: Secondary | ICD-10-CM | POA: Insufficient documentation

## 2022-12-08 DIAGNOSIS — R1084 Generalized abdominal pain: Secondary | ICD-10-CM

## 2022-12-08 DIAGNOSIS — K219 Gastro-esophageal reflux disease without esophagitis: Secondary | ICD-10-CM | POA: Diagnosis not present

## 2022-12-08 DIAGNOSIS — R000 Tachycardia, unspecified: Secondary | ICD-10-CM | POA: Diagnosis not present

## 2022-12-08 LAB — CBC
HCT: 37.3 % (ref 36.0–46.0)
Hemoglobin: 12.6 g/dL (ref 12.0–15.0)
MCH: 31 pg (ref 26.0–34.0)
MCHC: 33.8 g/dL (ref 30.0–36.0)
MCV: 91.6 fL (ref 80.0–100.0)
Platelets: 389 10*3/uL (ref 150–400)
RBC: 4.07 MIL/uL (ref 3.87–5.11)
RDW: 11.5 % (ref 11.5–15.5)
WBC: 5.9 10*3/uL (ref 4.0–10.5)
nRBC: 0 % (ref 0.0–0.2)

## 2022-12-08 LAB — BASIC METABOLIC PANEL
Anion gap: 12 (ref 5–15)
BUN: <5 mg/dL — ABNORMAL LOW (ref 6–20)
CO2: 24 mmol/L (ref 22–32)
Calcium: 8.9 mg/dL (ref 8.9–10.3)
Chloride: 99 mmol/L (ref 98–111)
Creatinine, Ser: 0.65 mg/dL (ref 0.44–1.00)
GFR, Estimated: >60 mL/min (ref >60)
Glucose, Bld: 99 mg/dL (ref 70–99)
Potassium: 2.8 mmol/L — ABNORMAL LOW (ref 3.5–5.1)
Sodium: 135 mmol/L (ref 135–145)

## 2022-12-08 LAB — I-STAT BETA HCG BLOOD, ED (MC, WL, AP ONLY): I-stat hCG, quantitative: <5 m[IU]/mL (ref <5)

## 2022-12-08 LAB — RESP PANEL BY RT-PCR (RSV, FLU A&B, COVID)  RVPGX2
Influenza A by PCR: NEGATIVE
Influenza B by PCR: NEGATIVE
Resp Syncytial Virus by PCR: NEGATIVE
SARS Coronavirus 2 by RT PCR: NEGATIVE

## 2022-12-08 MED ORDER — ONDANSETRON 4 MG PO TBDP
ORAL_TABLET | ORAL | Status: AC
  Filled 2022-12-08: qty 2

## 2022-12-08 MED ORDER — ONDANSETRON 4 MG PO TBDP
8.0000 mg | ORAL_TABLET | Freq: Once | ORAL | Status: AC
  Administered 2022-12-08: 8 mg via ORAL

## 2022-12-08 MED ORDER — ACETAMINOPHEN 500 MG PO TABS
1000.0000 mg | ORAL_TABLET | Freq: Once | ORAL | Status: AC
  Administered 2022-12-08: 1000 mg via ORAL
  Filled 2022-12-08: qty 2

## 2022-12-08 MED ORDER — IBUPROFEN 400 MG PO TABS
400.0000 mg | ORAL_TABLET | Freq: Once | ORAL | Status: AC
  Administered 2022-12-08: 400 mg via ORAL
  Filled 2022-12-08: qty 1

## 2022-12-08 NOTE — ED Provider Notes (Signed)
MSE  Pt with c/o general headache, runny nose, congestion, for past few days. Headaches constant. Indicates hx migraines, but states unusual to have headache for this long. No specific sinus area pain. No neck stiffness or rigidity on exam. No vomiting.   Will  check labs and  imaging. Definitive/complete ED eval by provider once patient able to be placed into treatment room.      Cathren Laine, MD 12/08/22 [phone]

## 2022-12-08 NOTE — ED Triage Notes (Signed)
Patient BIB [facility] from home for evaluation of headache and generalized weakness that started three days ago, patient's daughter was recently also ill but was negative for COVID, influenza, and RSV. Patient is alert, oriented, and in no apparent distress at this time.

## 2022-12-08 NOTE — ED Notes (Signed)
EDP at [facility] 

## 2022-12-09 ENCOUNTER — Emergency Department (HOSPITAL_COMMUNITY)
Admission: EM | Admit: 2022-12-09 | Discharge: 2022-12-10 | Disposition: A | Discharge status: Home / Self Care | Payer: Medicaid Other | Source: Home / Self Care | Attending: Emergency Medicine | Admitting: Emergency Medicine

## 2022-12-09 ENCOUNTER — Encounter (HOSPITAL_COMMUNITY): Payer: Self-pay

## 2022-12-09 ENCOUNTER — Other Ambulatory Visit: Payer: Self-pay

## 2022-12-09 ENCOUNTER — Emergency Department (HOSPITAL_COMMUNITY): Payer: Medicaid Other

## 2022-12-09 DIAGNOSIS — M791 Myalgia, unspecified site: Secondary | ICD-10-CM | POA: Insufficient documentation

## 2022-12-09 DIAGNOSIS — R109 Unspecified abdominal pain: Secondary | ICD-10-CM | POA: Insufficient documentation

## 2022-12-09 DIAGNOSIS — R112 Nausea with vomiting, unspecified: Secondary | ICD-10-CM | POA: Insufficient documentation

## 2022-12-09 DIAGNOSIS — R519 Headache, unspecified: Secondary | ICD-10-CM | POA: Insufficient documentation

## 2022-12-09 DIAGNOSIS — K219 Gastro-esophageal reflux disease without esophagitis: Secondary | ICD-10-CM | POA: Insufficient documentation

## 2022-12-09 DIAGNOSIS — R1110 Vomiting, unspecified: Secondary | ICD-10-CM

## 2022-12-09 DIAGNOSIS — R251 Tremor, unspecified: Secondary | ICD-10-CM | POA: Insufficient documentation

## 2022-12-09 DIAGNOSIS — R6883 Chills (without fever): Secondary | ICD-10-CM | POA: Insufficient documentation

## 2022-12-09 LAB — HEPATIC FUNCTION PANEL
ALT: 11 U/L (ref 0–44)
AST: 20 U/L (ref 15–41)
Albumin: 3.6 g/dL (ref 3.5–5.0)
Alkaline Phosphatase: 59 U/L (ref 38–126)
Bilirubin, Direct: <0.1 mg/dL (ref 0.0–0.2)
Total Bilirubin: 1.2 mg/dL (ref 0.3–1.2)
Total Protein: 8.3 g/dL — ABNORMAL HIGH (ref 6.5–8.1)

## 2022-12-09 LAB — D-DIMER, QUANTITATIVE: D-Dimer, Quant: 1.07 ug/mL-FEU — ABNORMAL HIGH (ref 0.00–0.50)

## 2022-12-09 LAB — TROPONIN I (HIGH SENSITIVITY): Troponin I (High Sensitivity): <2 ng/L (ref <18)

## 2022-12-09 LAB — LIPASE, BLOOD: Lipase: 24 U/L (ref 11–51)

## 2022-12-09 LAB — MAGNESIUM: Magnesium: 2.3 mg/dL (ref 1.7–2.4)

## 2022-12-09 MED ORDER — SODIUM CHLORIDE 0.9 % IV BOLUS
1000.0000 mL | Freq: Once | INTRAVENOUS | Status: AC
  Administered 2022-12-09: 1000 mL via INTRAVENOUS

## 2022-12-09 MED ORDER — POTASSIUM CHLORIDE 10 MEQ/100ML IV SOLN
10.0000 meq | INTRAVENOUS | Status: AC
  Administered 2022-12-09 (×2): 10 meq via INTRAVENOUS
  Filled 2022-12-09 (×2): qty 100

## 2022-12-09 MED ORDER — IOHEXOL 350 MG/ML SOLN
100.0000 mL | Freq: Once | INTRAVENOUS | Status: AC | PRN
  Administered 2022-12-09: 100 mL via INTRAVENOUS

## 2022-12-09 MED ORDER — ONDANSETRON 4 MG PO TBDP
4.0000 mg | ORAL_TABLET | Freq: Once | ORAL | Status: AC | PRN
  Administered 2022-12-09: 4 mg via ORAL
  Filled 2022-12-09: qty 1

## 2022-12-09 MED ORDER — METOCLOPRAMIDE HCL 5 MG/ML IJ SOLN
10.0000 mg | Freq: Once | INTRAMUSCULAR | Status: AC
  Administered 2022-12-09: 10 mg via INTRAVENOUS
  Filled 2022-12-09: qty 2

## 2022-12-09 MED ORDER — POTASSIUM CHLORIDE CRYS ER 20 MEQ PO TBCR
40.0000 meq | EXTENDED_RELEASE_TABLET | Freq: Once | ORAL | Status: AC
  Administered 2022-12-09: 40 meq via ORAL
  Filled 2022-12-09: qty 2

## 2022-12-09 MED ORDER — PROCHLORPERAZINE EDISYLATE 10 MG/2ML IJ SOLN
10.0000 mg | Freq: Once | INTRAMUSCULAR | Status: AC
  Administered 2022-12-09: 10 mg via INTRAVENOUS
  Filled 2022-12-09: qty 2

## 2022-12-09 MED ORDER — FAMOTIDINE 20 MG PO TABS: 20.0000 mg | ORAL_TABLET | Freq: Two times a day (BID) | ORAL | 0 refills | Status: DC

## 2022-12-09 MED ORDER — ONDANSETRON HCL 4 MG/2ML IJ SOLN
4.0000 mg | Freq: Once | INTRAMUSCULAR | Status: AC
Start: 2022-12-09 — End: 2022-12-09
  Administered 2022-12-09: 4 mg via INTRAVENOUS
  Filled 2022-12-09: qty 2

## 2022-12-09 MED ORDER — ALUM & MAG HYDROXIDE-SIMETH 200-200-20 MG/5ML PO SUSP
30.0000 mL | Freq: Once | ORAL | Status: AC
  Administered 2022-12-09: 30 mL via ORAL
  Filled 2022-12-09: qty 30

## 2022-12-09 MED ORDER — DIPHENHYDRAMINE HCL 25 MG PO CAPS
25.0000 mg | ORAL_CAPSULE | Freq: Once | ORAL | Status: AC
  Administered 2022-12-09: 25 mg via ORAL
  Filled 2022-12-09: qty 1

## 2022-12-09 MED ORDER — POTASSIUM CHLORIDE ER 10 MEQ PO TBCR: 10.0000 meq | EXTENDED_RELEASE_TABLET | Freq: Every day | ORAL | 0 refills | Status: DC

## 2022-12-09 MED ORDER — ONDANSETRON HCL 4 MG PO TABS: 4.0000 mg | ORAL_TABLET | Freq: Four times a day (QID) | ORAL | 0 refills | Status: DC

## 2022-12-09 NOTE — ED Notes (Signed)
Patient transported to CT 

## 2022-12-09 NOTE — Discharge Instructions (Addendum)
Lab work shows that your potassium is low,  starting you on a potassium pill please take as prescribed.  I want you to follow-up with your primary doctor in a week's time for reassessment of your potassium levels.    I have given you Zofran for nausea, Pepcid stomach acid, I recommend a bland diet, may use over-the-counter pain medication like ibuprofen Tylenol for general body aches.  Come back to the emergency department if you develop chest pain, shortness of breath, severe abdominal pain, uncontrolled nausea, vomiting, diarrhea.

## 2022-12-09 NOTE — ED Provider Notes (Signed)
[place] EMERGENCY DEPARTMENT AT [facility] Provider Note   CSN: [id number] Arrival date & time: 12/08/22  1517     History  Chief Complaint  Patient presents with   Weakness    Annette Juarez is a 27 y.o. female.  HPI   Patient with medical history including diabetes, GERD, presenting with complaints of viral-like illness.  Patient states that symptoms started about 3 days ago, endorsing fevers chills general body aches, as well as stomach pains nausea vomiting.  She states that she has been unable to tolerate p.o., will consciously vomit, she denies any bloody emesis or coffee-ground emesis, denies excessive alcohol use or NSAID use, no history of stomach ulcers denies any abdominal surgeries.  She also is noting that she is having some rib pain bilaterally as well as shortness of breath, no history of PEs or DVTs currently not on oral birth control, no recent surgeries no long immobilizations, she has no cardiac history.  She states that her daughter was ill earlier, but her symptoms are not as bad as hers.  States that she is never felt like this before.    Home Medications Prior to Admission medications   Medication Sig Start Date End Date Taking? Authorizing Provider  famotidine (PEPCID) 20 MG tablet Take 1 tablet (20 mg total) by mouth 2 (two) times daily for 7 days. 12/09/22 12/16/22 Yes Carroll Sage, PA-C  ondansetron (ZOFRAN) 4 MG tablet Take 1 tablet (4 mg total) by mouth every 6 (six) hours. 12/09/22  Yes Carroll Sage, PA-C  potassium chloride (KLOR-CON) 10 MEQ tablet Take 1 tablet (10 mEq total) by mouth daily for 5 days. 12/09/22 12/14/22 Yes Carroll Sage, PA-C  ibuprofen (ADVIL,MOTRIN) 800 MG tablet Take 1 tablet (800 mg total) by mouth every 8 (eight) hours as needed. 07/25/18   Rolm Bookbinder, CNM  methocarbamol (ROBAXIN) 500 MG tablet Take 1 tablet (500 mg total) by mouth 2 (two) times daily. 05/05/21   Garlon Hatchet, PA-C  naproxen  (NAPROSYN) 500 MG tablet Take 1 tablet (500 mg total) by mouth 2 (two) times daily with a meal. 05/05/21   Garlon Hatchet, PA-C  Prenatal-Fe Fum-Methf-FA w/o A (VITAFOL-NANO) 18-0.6-0.4 MG TABS Take 1 tablet by mouth daily. 01/23/18   Roe Coombs, CNM      Allergies    Patient has no known allergies.    Review of Systems   Review of Systems  Constitutional:  Positive for fatigue. Negative for chills and fever.  Respiratory:  Positive for shortness of breath.   Cardiovascular:  Positive for chest pain.  Gastrointestinal:  Positive for abdominal pain, nausea and vomiting.  Neurological:  Positive for headaches.    Physical Exam Updated Vital Signs BP 122/70   Pulse 76   Temp 98.3 F (36.8 C) (Oral)   Resp 20   Ht  (1.6 m)   Wt 95.3 kg   SpO2 94%   BMI 37.22 kg/m  Physical Exam Vitals and nursing note reviewed.  Constitutional:      General: She is not in acute distress.    Appearance: She is not ill-appearing.  HENT:     Head: Normocephalic and atraumatic.     Nose: No congestion.  Eyes:     Conjunctiva/sclera: Conjunctivae normal.  Cardiovascular:     Rate and Rhythm: Regular rhythm. Tachycardia present.     Pulses: Normal pulses.     Heart sounds: No murmur heard.  No friction rub. No gallop.  Pulmonary:     Effort: No respiratory distress.     Breath sounds: No wheezing, rhonchi or rales.  Abdominal:     Palpations: Abdomen is soft.     Tenderness: There is no abdominal tenderness. There is no right CVA tenderness or left CVA tenderness.  Musculoskeletal:     Comments: No unilateral leg swelling no calf tenderness no palpable cords.  Skin:    General: Skin is warm and dry.  Neurological:     Mental Status: She is alert.     Comments: No facial asymmetry no difficulty with word finding following two-step commands there is no unilateral weakness present.  Psychiatric:        Mood and Affect: Mood normal.     ED Results / Procedures / Treatments    Labs (all labs ordered are listed, but only abnormal results are displayed) Labs Reviewed  BASIC METABOLIC PANEL - Abnormal; Notable for the following components:      Result Value   Potassium 2.8 (*)    BUN <5 (*)    All other components within normal limits  HEPATIC FUNCTION PANEL - Abnormal; Notable for the following components:   Total Protein 8.3 (*)    All other components within normal limits  D-DIMER, QUANTITATIVE - Abnormal; Notable for the following components:   D-Dimer, Quant 1.07 (*)    All other components within normal limits  RESP PANEL BY RT-PCR (RSV, FLU A&B, COVID)  RVPGX2  CBC  MAGNESIUM  LIPASE, BLOOD  URINALYSIS, ROUTINE W REFLEX MICROSCOPIC  I-STAT BETA HCG BLOOD, ED ([facility], [facility], [facility] ONLY)  TROPONIN I (HIGH SENSITIVITY)    EKG EKG Interpretation  Date/Time:  Friday December 09 2022 00:57:50 EDT Ventricular Rate:  67 PR Interval:  175 QRS Duration: 85 QT Interval:  452 QTC Calculation: 478 R Axis:   55 Text Interpretation: Sinus arrhythmia Borderline T wave abnormalities Borderline prolonged QT interval No significant change was found Confirmed by Glynn Octave [id number]) on 12/09/2022 1:09:42 AM  Radiology CT Angio Chest PE W and/or Wo Contrast  Result Date: 12/09/2022 CLINICAL DATA:  Pulmonary embolism suspected, high probability. Abdominal pain. EXAM: CT ANGIOGRAPHY CHEST CT ABDOMEN AND PELVIS WITH CONTRAST TECHNIQUE: Multidetector CT imaging of the chest was performed using the standard protocol during bolus administration of intravenous contrast. Multiplanar CT image reconstructions and MIPs were obtained to evaluate the vascular anatomy. Multidetector CT imaging of the abdomen and pelvis was performed using the standard protocol during bolus administration of intravenous contrast. RADIATION DOSE REDUCTION: This exam was performed according to the departmental dose-optimization program which includes automated exposure control, adjustment of the mA and/or kV  according to patient size and/or use of iterative reconstruction technique. CONTRAST:  OMNIPAQUE IOHEXOL 350 MG/ML SOLN COMPARISON:  None Available. FINDINGS: CTA CHEST FINDINGS Cardiovascular: The heart is normal in size and there is no pericardial effusion. The aorta and pulmonary trunk are normal in caliber. No evidence of pulmonary embolism. Mediastinum/Nodes: No enlarged mediastinal, hilar, or axillary lymph nodes. Thyroid gland, trachea, and esophagus demonstrate no significant findings. Lungs/Pleura: There are trace bilateral pleural effusions. No pneumothorax. Musculoskeletal: No acute osseous abnormality. Review of the MIP images confirms the above findings. CT ABDOMEN and PELVIS FINDINGS Hepatobiliary: No focal liver abnormality is seen. No gallstones, gallbladder wall thickening, or biliary dilatation. Pancreas: Unremarkable. No pancreatic ductal dilatation or surrounding inflammatory changes. Spleen: Normal in size without focal abnormality. Adrenals/Urinary Tract: Adrenal glands are unremarkable. Kidneys are  normal, without renal calculi, focal lesion, or hydronephrosis. Bladder is unremarkable. Stomach/Bowel: Stomach is within normal limits. Appendix appears normal. No evidence of bowel wall thickening, distention, or inflammatory changes. No free air or pneumatosis. Scattered diverticula are present along the colon without evidence of diverticulitis. Multiple fat attenuation lesions are present in the sigmoid colon, the largest measuring 2.7 cm, suggesting lipomas. Vascular/Lymphatic: No significant vascular findings are present. No enlarged abdominal or pelvic lymph nodes. Reproductive: Uterus and bilateral adnexa are unremarkable. Other: No abdominopelvic ascites. Musculoskeletal: No acute osseous abnormality. Review of the MIP images confirms the above findings. IMPRESSION: 1. No evidence of pulmonary embolism. 2. Trace bilateral pleural effusions. 3. Scattered colonic diverticula without  evidence of diverticulitis. 4. Multiple fat attenuation lesions in the sigmoid colon suggesting lipomas. Electronically Signed   By: Thornell Sartorius M.D.   On: 12/09/2022 02:45   CT ABDOMEN PELVIS W CONTRAST  Result Date: 12/09/2022 CLINICAL DATA:  Pulmonary embolism suspected, high probability. Abdominal pain. EXAM: CT ANGIOGRAPHY CHEST CT ABDOMEN AND PELVIS WITH CONTRAST TECHNIQUE: Multidetector CT imaging of the chest was performed using the standard protocol during bolus administration of intravenous contrast. Multiplanar CT image reconstructions and MIPs were obtained to evaluate the vascular anatomy. Multidetector CT imaging of the abdomen and pelvis was performed using the standard protocol during bolus administration of intravenous contrast. RADIATION DOSE REDUCTION: This exam was performed according to the departmental dose-optimization program which includes automated exposure control, adjustment of the mA and/or kV according to patient size and/or use of iterative reconstruction technique. CONTRAST:  OMNIPAQUE IOHEXOL 350 MG/ML SOLN COMPARISON:  None Available. FINDINGS: CTA CHEST FINDINGS Cardiovascular: The heart is normal in size and there is no pericardial effusion. The aorta and pulmonary trunk are normal in caliber. No evidence of pulmonary embolism. Mediastinum/Nodes: No enlarged mediastinal, hilar, or axillary lymph nodes. Thyroid gland, trachea, and esophagus demonstrate no significant findings. Lungs/Pleura: There are trace bilateral pleural effusions. No pneumothorax. Musculoskeletal: No acute osseous abnormality. Review of the MIP images confirms the above findings. CT ABDOMEN and PELVIS FINDINGS Hepatobiliary: No focal liver abnormality is seen. No gallstones, gallbladder wall thickening, or biliary dilatation. Pancreas: Unremarkable. No pancreatic ductal dilatation or surrounding inflammatory changes. Spleen: Normal in size without focal abnormality. Adrenals/Urinary Tract: Adrenal  glands are unremarkable. Kidneys are normal, without renal calculi, focal lesion, or hydronephrosis. Bladder is unremarkable. Stomach/Bowel: Stomach is within normal limits. Appendix appears normal. No evidence of bowel wall thickening, distention, or inflammatory changes. No free air or pneumatosis. Scattered diverticula are present along the colon without evidence of diverticulitis. Multiple fat attenuation lesions are present in the sigmoid colon, the largest measuring 2.7 cm, suggesting lipomas. Vascular/Lymphatic: No significant vascular findings are present. No enlarged abdominal or pelvic lymph nodes. Reproductive: Uterus and bilateral adnexa are unremarkable. Other: No abdominopelvic ascites. Musculoskeletal: No acute osseous abnormality. Review of the MIP images confirms the above findings. IMPRESSION: 1. No evidence of pulmonary embolism. 2. Trace bilateral pleural effusions. 3. Scattered colonic diverticula without evidence of diverticulitis. 4. Multiple fat attenuation lesions in the sigmoid colon suggesting lipomas. Electronically Signed   By: Thornell Sartorius M.D.   On: 12/09/2022 02:45   CT Head Wo Contrast  Result Date: 12/08/2022 CLINICAL DATA:  Headache, increasing frequency or severity EXAM: CT HEAD WITHOUT CONTRAST TECHNIQUE: Contiguous axial images were obtained from the base of the skull through the vertex without intravenous contrast. RADIATION DOSE REDUCTION: This exam was performed according to the departmental dose-optimization program which includes automated exposure  control, adjustment of the mA and/or kV according to patient size and/or use of iterative reconstruction technique. COMPARISON:  None Available. FINDINGS: Brain: No evidence of acute infarction, hemorrhage, hydrocephalus, extra-axial collection or mass lesion/mass effect. Vascular: No hyperdense vessel. Skull: No acute fracture. Sinuses/Orbits: Mild paranasal sinus mucosal thickening. No acute orbital findings. Other: No  mastoid effusions. IMPRESSION: No acute intracranial abnormality. Electronically Signed   By: Feliberto Harts M.D.   On: 12/08/2022 17:18    Procedures Procedures    Medications Ordered in ED Medications  acetaminophen (TYLENOL) tablet 1,000 mg (1,000 mg Oral Given 12/08/22 1616)  ibuprofen (ADVIL) tablet 400 mg (400 mg Oral Given 12/08/22 1617)  ondansetron (ZOFRAN-ODT) disintegrating tablet 8 mg (8 mg Oral Given 12/08/22 1620)  sodium chloride 0.9 % bolus 1,000 mL (1,000 mLs Intravenous New Bag/Given 12/09/22 0018)  ondansetron (ZOFRAN) injection 4 mg (4 mg Intravenous Given 12/09/22 0018)  metoCLOPramide (REGLAN) injection 10 mg (10 mg Intravenous Given 12/09/22 0019)  diphenhydrAMINE (BENADRYL) capsule 25 mg (25 mg Oral Given 12/09/22 0019)  potassium chloride 10 mEq in 100 mL IVPB (0 mEq Intravenous Stopped 12/09/22 0308)  potassium chloride SA (KLOR-CON M) CR tablet 40 mEq (40 mEq Oral Given 12/09/22 0102)  prochlorperazine (COMPAZINE) injection 10 mg (10 mg Intravenous Given 12/09/22 0201)  alum & mag hydroxide-simeth (MAALOX/MYLANTA) 200-200-20 MG/5ML suspension 30 mL (30 mLs Oral Given 12/09/22 0202)  iohexol (OMNIPAQUE) 350 MG/ML injection 100 mL (100 mLs Intravenous Contrast Given 12/09/22 0226)    ED Course/ Medical Decision Making/ A&P                             Medical Decision Making Amount and/or Complexity of Data Reviewed Labs: ordered. Radiology: ordered.  Risk OTC drugs. Prescription drug management.   This patient presents to the ED for concern of viral-like illness, this involves an extensive number of treatment options, and is a complaint that carries with it a high risk of complications and morbidity.  The differential diagnosis includes ACS, PE, pancreatitis, gastroenteritis,    Additional history obtained:  Additional history obtained from N/A External records from outside source obtained and reviewed including ER notes   Co morbidities that complicate  the patient evaluation  GERD  Social Determinants of Health:  No primary care provider    Lab Tests:  I Ordered, and personally interpreted labs.  The pertinent results include: CBC is unremarkable, BMP reveals potassium 2.8, hepatic panel unremarkable, magnesium unremarkable, lipase 24, hCG negative   Imaging Studies ordered:  I ordered imaging studies including CTA chest, CT abdomen pelvis I independently visualized and interpreted imaging which showed CT scans all negative acute findings I agree with the radiologist interpretation   Cardiac Monitoring:  The patient was maintained on a cardiac monitor.  I personally viewed and interpreted the cardiac monitored which showed an underlying rhythm of: Without signs of ischemia   Medicines ordered and prescription drug management:  I ordered medication including fluids, antiemetics, migraine cocktail I have reviewed the patients home medicines and have made adjustments as needed  Critical Interventions:  N/A   Reevaluation:  Presents with viral like illness, on my exam she is endorsing shortness of breath, rib pain, nausea vomiting, and a headache, triage obtain basic lab work imaging which I personally reviewed, noted hypokalemia, will add on a magnesium level, provided with supplemental potassium, due to her headache will provide a migraine cocktail, will also add on troponins as well  as D-dimer for further evaluation of rib pain/shortness of breath, add on hepatic panel lipase for nausea vomiting.  Reassessed the patient, states headache has resolved but still endorsing abdominal pain and nausea, will provide patient with additional antiemetics, sent down for CT on pelvis, she does have an elevated D-dimer, will also obtain CT of chest.  Reassessed the patient resting comfortably, states she is feeling much better, agreement to discharge at this time.      Consultations Obtained:  N/a    Test  Considered:  N/a    Rule out I have low suspicion for ACS as history is atypical, patient has no cardiac history, EKG was sinus rhythm without signs of ischemia, patient had a negative troponin.  Low suspicion for PE or dissection on CT is negative for these findings.  I doubt pancreatitis lipase within normal limits.  I doubt cholecystitis choledocholithiasis or cholangitis no elevation in liver signs alk phos T. bili, there is no common duct  dilation seen on CT imaging.   I doubt diverticulitis, appendicitis, volvulus, bowel obstruction CT imaging negative these findings.  Low suspicion for systemic infection as patient is nontoxic-appearing, vital signs reassuring, no obvious source infection noted on exam.     Dispostion and problem list  After consideration of the diagnostic results and the patients response to treatment, I feel that the patent would benefit from discharge.  Nausea vomiting abdominal pain-suspect viral gastroenteritis, will discharge home on antiemetics, recommend bland diet, follow-up PCP for further evaluation strict return cautions. Hypokalemia-likely from GI loss from vomiting, will discharge home on supplements follow-up with PCP for reassessment           Final Clinical Impression(s) / ED Diagnoses Final diagnoses:  Nausea and vomiting, unspecified vomiting type  Generalized abdominal pain  Hypokalemia    Rx / DC Orders ED Discharge Orders          Ordered    ondansetron (ZOFRAN) 4 MG tablet  Every 6 hours        12/09/22 0317    famotidine (PEPCID) 20 MG tablet  2 times daily        12/09/22 0318    potassium chloride (KLOR-CON) 10 MEQ tablet  Daily        12/09/22 0319              Carroll Sage, PA-C 12/09/22 0320    Glynn Octave, MD 12/09/22 [phone]

## 2022-12-09 NOTE — ED Triage Notes (Signed)
Patient BIB [facility] from home. Nausea, vomiting since Monday. Given nausea medication and was told her potassium was low yesterday.

## 2022-12-09 NOTE — ED Notes (Signed)
Urine sent to the lab

## 2022-12-09 NOTE — ED Notes (Signed)
Lab called to add on labs 

## 2022-12-10 ENCOUNTER — Emergency Department (HOSPITAL_COMMUNITY): Payer: Medicaid Other

## 2022-12-10 LAB — URINALYSIS, ROUTINE W REFLEX MICROSCOPIC
Bacteria, UA: NONE SEEN
Bilirubin Urine: NEGATIVE
Glucose, UA: NEGATIVE mg/dL
Hgb urine dipstick: NEGATIVE
Ketones, ur: 20 mg/dL — AB
Leukocytes,Ua: NEGATIVE
Nitrite: NEGATIVE
Protein, ur: 30 mg/dL — AB
Specific Gravity, Urine: 1.024 (ref 1.005–1.030)
WBC, UA: >50 WBC/hpf (ref 0–5)
pH: 6 (ref 5.0–8.0)

## 2022-12-10 LAB — COMPREHENSIVE METABOLIC PANEL
ALT: 14 U/L (ref 0–44)
AST: 24 U/L (ref 15–41)
Albumin: 3.7 g/dL (ref 3.5–5.0)
Alkaline Phosphatase: 54 U/L (ref 38–126)
Anion gap: 10 (ref 5–15)
BUN: 7 mg/dL (ref 6–20)
CO2: 24 mmol/L (ref 22–32)
Calcium: 8.5 mg/dL — ABNORMAL LOW (ref 8.9–10.3)
Chloride: 97 mmol/L — ABNORMAL LOW (ref 98–111)
Creatinine, Ser: 0.74 mg/dL (ref 0.44–1.00)
GFR, Estimated: >60 mL/min (ref >60)
Glucose, Bld: 155 mg/dL — ABNORMAL HIGH (ref 70–99)
Potassium: 3.1 mmol/L — ABNORMAL LOW (ref 3.5–5.1)
Sodium: 131 mmol/L — ABNORMAL LOW (ref 135–145)
Total Bilirubin: 0.8 mg/dL (ref 0.3–1.2)
Total Protein: 8.3 g/dL — ABNORMAL HIGH (ref 6.5–8.1)

## 2022-12-10 LAB — LIPASE, BLOOD: Lipase: 26 U/L (ref 11–51)

## 2022-12-10 LAB — CBC
HCT: 33.3 % — ABNORMAL LOW (ref 36.0–46.0)
Hemoglobin: 11.5 g/dL — ABNORMAL LOW (ref 12.0–15.0)
MCH: 31.4 pg (ref 26.0–34.0)
MCHC: 34.5 g/dL (ref 30.0–36.0)
MCV: 91 fL (ref 80.0–100.0)
Platelets: 384 10*3/uL (ref 150–400)
RBC: 3.66 MIL/uL — ABNORMAL LOW (ref 3.87–5.11)
RDW: 11.6 % (ref 11.5–15.5)
WBC: 6.5 10*3/uL (ref 4.0–10.5)
nRBC: 0 % (ref 0.0–0.2)

## 2022-12-10 LAB — HCG, QUANTITATIVE, PREGNANCY: hCG, Beta Chain, Quant, S: <1 m[IU]/mL (ref <5)

## 2022-12-10 MED ORDER — VALPROATE SODIUM 100 MG/ML IV SOLN
1000.0000 mg | Freq: Once | INTRAVENOUS | Status: AC
  Administered 2022-12-10: 1000 mg via INTRAVENOUS
  Filled 2022-12-10: qty 10

## 2022-12-10 MED ORDER — KETOROLAC TROMETHAMINE 15 MG/ML IJ SOLN
15.0000 mg | Freq: Once | INTRAMUSCULAR | Status: AC
  Administered 2022-12-10: 15 mg via INTRAVENOUS
  Filled 2022-12-10: qty 1

## 2022-12-10 MED ORDER — IOHEXOL 350 MG/ML SOLN
75.0000 mL | Freq: Once | INTRAVENOUS | Status: AC | PRN
  Administered 2022-12-10: 75 mL via INTRAVENOUS

## 2022-12-10 MED ORDER — POTASSIUM CHLORIDE CRYS ER 20 MEQ PO TBCR
40.0000 meq | EXTENDED_RELEASE_TABLET | Freq: Once | ORAL | Status: AC
  Administered 2022-12-10: 40 meq via ORAL
  Filled 2022-12-10: qty 2

## 2022-12-10 MED ORDER — PROCHLORPERAZINE EDISYLATE 10 MG/2ML IJ SOLN
10.0000 mg | Freq: Once | INTRAMUSCULAR | Status: AC
  Administered 2022-12-10: 10 mg via INTRAVENOUS
  Filled 2022-12-10: qty 2

## 2022-12-10 MED ORDER — METOCLOPRAMIDE HCL 5 MG/ML IJ SOLN
10.0000 mg | Freq: Once | INTRAMUSCULAR | Status: AC
  Administered 2022-12-10: 10 mg via INTRAVENOUS
  Filled 2022-12-10: qty 2

## 2022-12-10 MED ORDER — PROMETHAZINE HCL 25 MG PO TABS: 25.0000 mg | ORAL_TABLET | Freq: Four times a day (QID) | ORAL | 0 refills | Status: DC | PRN

## 2022-12-10 MED ORDER — MAGNESIUM SULFATE 2 GM/50ML IV SOLN
2.0000 g | Freq: Once | INTRAVENOUS | Status: AC
  Administered 2022-12-10: 2 g via INTRAVENOUS
  Filled 2022-12-10: qty 50

## 2022-12-10 MED ORDER — DIPHENHYDRAMINE HCL 50 MG/ML IJ SOLN
25.0000 mg | Freq: Once | INTRAMUSCULAR | Status: AC
  Administered 2022-12-10: 25 mg via INTRAVENOUS
  Filled 2022-12-10: qty 1

## 2022-12-10 MED ORDER — MORPHINE SULFATE (PF) 4 MG/ML IV SOLN
4.0000 mg | Freq: Once | INTRAVENOUS | Status: AC
  Administered 2022-12-10: 4 mg via INTRAVENOUS
  Filled 2022-12-10: qty 1

## 2022-12-10 MED ORDER — SODIUM CHLORIDE 0.9 % IV BOLUS
1000.0000 mL | Freq: Once | INTRAVENOUS | Status: AC
  Administered 2022-12-10: 1000 mL via INTRAVENOUS

## 2022-12-10 MED ORDER — MECLIZINE HCL 12.5 MG PO TABS: 12.5000 mg | ORAL_TABLET | Freq: Three times a day (TID) | ORAL | 0 refills | Status: DC | PRN

## 2022-12-10 MED ORDER — PROMETHAZINE HCL 25 MG RE SUPP: 25.0000 mg | Freq: Four times a day (QID) | RECTAL | 0 refills | Status: DC | PRN

## 2022-12-10 MED ORDER — MECLIZINE HCL 25 MG PO TABS
25.0000 mg | ORAL_TABLET | Freq: Once | ORAL | Status: AC
  Administered 2022-12-10: 25 mg via ORAL
  Filled 2022-12-10: qty 1

## 2022-12-10 MED ORDER — DROPERIDOL 2.5 MG/ML IJ SOLN
2.5000 mg | Freq: Once | INTRAMUSCULAR | Status: AC
  Administered 2022-12-10: 2.5 mg via INTRAVENOUS
  Filled 2022-12-10: qty 2

## 2022-12-10 MED ORDER — DEXAMETHASONE SODIUM PHOSPHATE 10 MG/ML IJ SOLN
10.0000 mg | Freq: Once | INTRAMUSCULAR | Status: AC
  Administered 2022-12-10: 10 mg via INTRAVENOUS
  Filled 2022-12-10: qty 1

## 2022-12-10 NOTE — ED Provider Notes (Addendum)
[place] [facility]ENC[facility] DEPARTMENT AT [facility] Provider Note   CSN: [id number] Arrival date & time: 12/09/22  1809     History  Chief Complaint  Patient presents with   Emesis    Annette Juarez is a 27 y.o. female.  History of  GERD.  She presents to the ER today complaining of nausea vomiting abdominal pain with chills, body aches and headache.  States this is been ongoing for about 4 days, was seen on 01/07/2023 for the same and states she felt better while in the ED but has not been able to keep her medications down, she is on home with Pepcid, Zofran and potassium tablets.    Emesis      Home Medications Prior to Admission medications   Medication Sig Start Date End Date Taking? Authorizing Provider  promethazine (PHENERGAN) 25 MG suppository Place 1 suppository (25 mg total) rectally every 6 (six) hours as needed for nausea or vomiting. 12/10/22  Yes Bitha Fauteux A, PA-C  famotidine (PEPCID) 20 MG tablet Take 1 tablet (20 mg total) by mouth 2 (two) times daily for 7 days. 12/09/22 12/16/22  Carroll Sage, PA-C  ibuprofen (ADVIL,MOTRIN) 800 MG tablet Take 1 tablet (800 mg total) by mouth every 8 (eight) hours as needed. 07/25/18   Rolm Bookbinder, CNM  methocarbamol (ROBAXIN) 500 MG tablet Take 1 tablet (500 mg total) by mouth 2 (two) times daily. 05/05/21   Garlon Hatchet, PA-C  naproxen (NAPROSYN) 500 MG tablet Take 1 tablet (500 mg total) by mouth 2 (two) times daily with a meal. 05/05/21   Garlon Hatchet, PA-C  ondansetron (ZOFRAN) 4 MG tablet Take 1 tablet (4 mg total) by mouth every 6 (six) hours. 12/09/22   Carroll Sage, PA-C  potassium chloride (KLOR-CON) 10 MEQ tablet Take 1 tablet (10 mEq total) by mouth daily for 5 days. 12/09/22 12/14/22  Carroll Sage, PA-C  Prenatal-Fe Fum-Methf-FA w/o A (VITAFOL-NANO) 18-0.6-0.4 MG TABS Take 1 tablet by mouth daily. 01/23/18   Roe Coombs, CNM      Allergies    Patient has no known allergies.     Review of Systems   Review of Systems  Gastrointestinal:  Positive for vomiting.    Physical Exam Updated Vital Signs BP (!) 141/79   Pulse 65   Temp (!) 97.5 F (36.4 C)   Resp 16   Ht 5\' 3"  (1.6 m)   Wt 96 kg   SpO2 99%   BMI 37.49 kg/m  Physical Exam Vitals and nursing note reviewed.  Constitutional:      General: She is not in acute distress.    Appearance: She is well-developed.  HENT:     Head: Normocephalic and atraumatic.     Mouth/Throat:     Mouth: Mucous membranes are moist.  Eyes:     General: No visual field deficit.    Conjunctiva/sclera: Conjunctivae normal.  Cardiovascular:     Rate and Rhythm: Normal rate and regular rhythm.     Heart sounds: No murmur heard. Pulmonary:     Effort: Pulmonary effort is normal. No respiratory distress.     Breath sounds: Normal breath sounds.  Abdominal:     Palpations: Abdomen is soft.     Tenderness: There is no abdominal tenderness.  Musculoskeletal:        General: No swelling.     Cervical back: Neck supple. No rigidity or tenderness.  Skin:    General: Skin  is warm and dry.     Capillary Refill: Capillary refill takes less than 2 seconds.  Neurological:     General: No focal deficit present.     Mental Status: She is alert and oriented to person, place, and time.     GCS: GCS eye subscore is 4. GCS verbal subscore is 5. GCS motor subscore is 6.     Cranial Nerves: No dysarthria or facial asymmetry.     Sensory: Sensation is intact. No sensory deficit.     Motor: Tremor and pronator drift present. No weakness or seizure activity.     Coordination: Finger-Nose-Finger Test normal.  Psychiatric:        Mood and Affect: Mood normal.     ED Results / Procedures / Treatments   Labs (all labs ordered are listed, but only abnormal results are displayed) Labs Reviewed  COMPREHENSIVE METABOLIC PANEL - Abnormal; Notable for the following components:      Result Value   Sodium 131 (*)    Potassium 3.1 (*)     Chloride 97 (*)    Glucose, Bld 155 (*)    Calcium 8.5 (*)    Total Protein 8.3 (*)    All other components within normal limits  CBC - Abnormal; Notable for the following components:   RBC 3.66 (*)    Hemoglobin 11.5 (*)    HCT 33.3 (*)    All other components within normal limits  URINALYSIS, ROUTINE W REFLEX MICROSCOPIC - Abnormal; Notable for the following components:   APPearance TURBID (*)    Ketones, ur 20 (*)    Protein, ur 30 (*)    All other components within normal limits  LIPASE, BLOOD  HCG, QUANTITATIVE, PREGNANCY  I-STAT BETA HCG BLOOD, ED ([facility], [facility], [facility] ONLY)    EKG None  Radiology CT Angio Head W or Wo Contrast  Result Date: 12/10/2022 CLINICAL DATA:  Initial evaluation for acute headache. EXAM: CT ANGIOGRAPHY HEAD AND NECK TECHNIQUE: Multidetector CT imaging of the head and neck was performed using the standard protocol during bolus administration of intravenous contrast. Multiplanar CT image reconstructions and MIPs were obtained to evaluate the vascular anatomy. Carotid stenosis measurements (when applicable) are obtained utilizing NASCET criteria, using the distal internal carotid diameter as the denominator. RADIATION DOSE REDUCTION: This exam was performed according to the departmental dose-optimization program which includes automated exposure control, adjustment of the mA and/or kV according to patient size and/or use of iterative reconstruction technique. CONTRAST:  75mL OMNIPAQUE IOHEXOL 350 MG/ML SOLN COMPARISON:  CT from earlier the same day. FINDINGS: CTA NECK FINDINGS Aortic arch: Visualized aortic arch normal in caliber with standard branching pattern. No stenosis about the origin of the great vessels. Right carotid system: Right common and internal carotid arteries widely patent without stenosis, dissection or occlusion. Left carotid system: Left common and internal carotid arteries widely patent without stenosis, dissection or occlusion. Vertebral arteries:  Left vertebral artery arises directly from the aortic arch. Vertebral arteries are patent without stenosis or dissection. Skeleton: No discrete or worrisome osseous lesions. Other neck: No other acute soft tissue abnormality within the neck. Upper chest: Visualized upper chest demonstrates no acute finding. Review of the MIP images confirms the above findings CTA HEAD FINDINGS Anterior circulation: Both internal carotid arteries are patent to the termini without stenosis or other abnormality. A1 segments patent bilaterally. Normal anterior communicating complex. Anterior cerebral arteries are widely patent without stenosis. No M1 stenosis or occlusion. No proximal MCA branch occlusion  or high-grade stenosis. Distal MCA branches perfused and symmetric. Posterior circulation: Both V4 segments are widely patent. Right vertebral artery slightly dominant. Both PICA are patent at their origins. Basilar patent without stenosis. Superior cerebellar and posterior cerebral arteries patent bilaterally. Venous sinuses: Patent allowing for timing the contrast bolus. Anatomic variants: None significant.  No aneurysm. Review of the MIP images confirms the above findings IMPRESSION: Normal CTA of the head and neck. No large vessel occlusion, hemodynamically significant stenosis, or other acute vascular abnormality. No aneurysm. Electronically Signed   By: Rise Mu M.D.   On: 12/10/2022 02:38   CT Angio Neck W and/or Wo Contrast  Result Date: 12/10/2022 CLINICAL DATA:  Initial evaluation for acute headache. EXAM: CT ANGIOGRAPHY HEAD AND NECK TECHNIQUE: Multidetector CT imaging of the head and neck was performed using the standard protocol during bolus administration of intravenous contrast. Multiplanar CT image reconstructions and MIPs were obtained to evaluate the vascular anatomy. Carotid stenosis measurements (when applicable) are obtained utilizing NASCET criteria, using the distal internal carotid diameter as  the denominator. RADIATION DOSE REDUCTION: This exam was performed according to the departmental dose-optimization program which includes automated exposure control, adjustment of the mA and/or kV according to patient size and/or use of iterative reconstruction technique. CONTRAST:  75mL OMNIPAQUE IOHEXOL 350 MG/ML SOLN COMPARISON:  CT from earlier the same day. FINDINGS: CTA NECK FINDINGS Aortic arch: Visualized aortic arch normal in caliber with standard branching pattern. No stenosis about the origin of the great vessels. Right carotid system: Right common and internal carotid arteries widely patent without stenosis, dissection or occlusion. Left carotid system: Left common and internal carotid arteries widely patent without stenosis, dissection or occlusion. Vertebral arteries: Left vertebral artery arises directly from the aortic arch. Vertebral arteries are patent without stenosis or dissection. Skeleton: No discrete or worrisome osseous lesions. Other neck: No other acute soft tissue abnormality within the neck. Upper chest: Visualized upper chest demonstrates no acute finding. Review of the MIP images confirms the above findings CTA HEAD FINDINGS Anterior circulation: Both internal carotid arteries are patent to the termini without stenosis or other abnormality. A1 segments patent bilaterally. Normal anterior communicating complex. Anterior cerebral arteries are widely patent without stenosis. No M1 stenosis or occlusion. No proximal MCA branch occlusion or high-grade stenosis. Distal MCA branches perfused and symmetric. Posterior circulation: Both V4 segments are widely patent. Right vertebral artery slightly dominant. Both PICA are patent at their origins. Basilar patent without stenosis. Superior cerebellar and posterior cerebral arteries patent bilaterally. Venous sinuses: Patent allowing for timing the contrast bolus. Anatomic variants: None significant.  No aneurysm. Review of the MIP images confirms  the above findings IMPRESSION: Normal CTA of the head and neck. No large vessel occlusion, hemodynamically significant stenosis, or other acute vascular abnormality. No aneurysm. Electronically Signed   By: Rise Mu M.D.   On: 12/10/2022 02:38   CT Head Wo Contrast  Result Date: 12/10/2022 CLINICAL DATA:  Headache and dizziness in a 27 year old female EXAM: CT HEAD WITHOUT CONTRAST TECHNIQUE: Contiguous axial images were obtained from the base of the skull through the vertex without intravenous contrast. RADIATION DOSE REDUCTION: This exam was performed according to the departmental dose-optimization program which includes automated exposure control, adjustment of the mA and/or kV according to patient size and/or use of iterative reconstruction technique. COMPARISON:  CT head 12/08/2022 FINDINGS: Brain: No intracranial hemorrhage, mass effect, or evidence of acute infarct. No hydrocephalus. No extra-axial fluid collection. Vascular: No hyperdense vessel or unexpected calcification.  Skull: No fracture or focal lesion. Sinuses/Orbits: No acute finding. Paranasal sinuses and mastoid air cells are well aerated. Other: None. IMPRESSION: No acute intracranial process. Electronically Signed   By: Minerva Fester M.D.   On: 12/10/2022 00:45   CT Angio Chest PE W and/or Wo Contrast  Result Date: 12/09/2022 CLINICAL DATA:  Pulmonary embolism suspected, high probability. Abdominal pain. EXAM: CT ANGIOGRAPHY CHEST CT ABDOMEN AND PELVIS WITH CONTRAST TECHNIQUE: Multidetector CT imaging of the chest was performed using the standard protocol during bolus administration of intravenous contrast. Multiplanar CT image reconstructions and MIPs were obtained to evaluate the vascular anatomy. Multidetector CT imaging of the abdomen and pelvis was performed using the standard protocol during bolus administration of intravenous contrast. RADIATION DOSE REDUCTION: This exam was performed according to the departmental  dose-optimization program which includes automated exposure control, adjustment of the mA and/or kV according to patient size and/or use of iterative reconstruction technique. CONTRAST:  OMNIPAQUE IOHEXOL 350 MG/ML SOLN COMPARISON:  None Available. FINDINGS: CTA CHEST FINDINGS Cardiovascular: The heart is normal in size and there is no pericardial effusion. The aorta and pulmonary trunk are normal in caliber. No evidence of pulmonary embolism. Mediastinum/Nodes: No enlarged mediastinal, hilar, or axillary lymph nodes. Thyroid gland, trachea, and esophagus demonstrate no significant findings. Lungs/Pleura: There are trace bilateral pleural effusions. No pneumothorax. Musculoskeletal: No acute osseous abnormality. Review of the MIP images confirms the above findings. CT ABDOMEN and PELVIS FINDINGS Hepatobiliary: No focal liver abnormality is seen. No gallstones, gallbladder wall thickening, or biliary dilatation. Pancreas: Unremarkable. No pancreatic ductal dilatation or surrounding inflammatory changes. Spleen: Normal in size without focal abnormality. Adrenals/Urinary Tract: Adrenal glands are unremarkable. Kidneys are normal, without renal calculi, focal lesion, or hydronephrosis. Bladder is unremarkable. Stomach/Bowel: Stomach is within normal limits. Appendix appears normal. No evidence of bowel wall thickening, distention, or inflammatory changes. No free air or pneumatosis. Scattered diverticula are present along the colon without evidence of diverticulitis. Multiple fat attenuation lesions are present in the sigmoid colon, the largest measuring 2.7 cm, suggesting lipomas. Vascular/Lymphatic: No significant vascular findings are present. No enlarged abdominal or pelvic lymph nodes. Reproductive: Uterus and bilateral adnexa are unremarkable. Other: No abdominopelvic ascites. Musculoskeletal: No acute osseous abnormality. Review of the MIP images confirms the above findings. IMPRESSION: 1. No evidence of  pulmonary embolism. 2. Trace bilateral pleural effusions. 3. Scattered colonic diverticula without evidence of diverticulitis. 4. Multiple fat attenuation lesions in the sigmoid colon suggesting lipomas. Electronically Signed   By: Thornell Sartorius M.D.   On: 12/09/2022 02:45   CT ABDOMEN PELVIS W CONTRAST  Result Date: 12/09/2022 CLINICAL DATA:  Pulmonary embolism suspected, high probability. Abdominal pain. EXAM: CT ANGIOGRAPHY CHEST CT ABDOMEN AND PELVIS WITH CONTRAST TECHNIQUE: Multidetector CT imaging of the chest was performed using the standard protocol during bolus administration of intravenous contrast. Multiplanar CT image reconstructions and MIPs were obtained to evaluate the vascular anatomy. Multidetector CT imaging of the abdomen and pelvis was performed using the standard protocol during bolus administration of intravenous contrast. RADIATION DOSE REDUCTION: This exam was performed according to the departmental dose-optimization program which includes automated exposure control, adjustment of the mA and/or kV according to patient size and/or use of iterative reconstruction technique. CONTRAST:  OMNIPAQUE IOHEXOL 350 MG/ML SOLN COMPARISON:  None Available. FINDINGS: CTA CHEST FINDINGS Cardiovascular: The heart is normal in size and there is no pericardial effusion. The aorta and pulmonary trunk are normal in caliber. No evidence of pulmonary embolism. Mediastinum/Nodes: No  enlarged mediastinal, hilar, or axillary lymph nodes. Thyroid gland, trachea, and esophagus demonstrate no significant findings. Lungs/Pleura: There are trace bilateral pleural effusions. No pneumothorax. Musculoskeletal: No acute osseous abnormality. Review of the MIP images confirms the above findings. CT ABDOMEN and PELVIS FINDINGS Hepatobiliary: No focal liver abnormality is seen. No gallstones, gallbladder wall thickening, or biliary dilatation. Pancreas: Unremarkable. No pancreatic ductal dilatation or surrounding  inflammatory changes. Spleen: Normal in size without focal abnormality. Adrenals/Urinary Tract: Adrenal glands are unremarkable. Kidneys are normal, without renal calculi, focal lesion, or hydronephrosis. Bladder is unremarkable. Stomach/Bowel: Stomach is within normal limits. Appendix appears normal. No evidence of bowel wall thickening, distention, or inflammatory changes. No free air or pneumatosis. Scattered diverticula are present along the colon without evidence of diverticulitis. Multiple fat attenuation lesions are present in the sigmoid colon, the largest measuring 2.7 cm, suggesting lipomas. Vascular/Lymphatic: No significant vascular findings are present. No enlarged abdominal or pelvic lymph nodes. Reproductive: Uterus and bilateral adnexa are unremarkable. Other: No abdominopelvic ascites. Musculoskeletal: No acute osseous abnormality. Review of the MIP images confirms the above findings. IMPRESSION: 1. No evidence of pulmonary embolism. 2. Trace bilateral pleural effusions. 3. Scattered colonic diverticula without evidence of diverticulitis. 4. Multiple fat attenuation lesions in the sigmoid colon suggesting lipomas. Electronically Signed   By: Thornell Sartorius M.D.   On: 12/09/2022 02:45   CT Head Wo Contrast  Result Date: 12/08/2022 CLINICAL DATA:  Headache, increasing frequency or severity EXAM: CT HEAD WITHOUT CONTRAST TECHNIQUE: Contiguous axial images were obtained from the base of the skull through the vertex without intravenous contrast. RADIATION DOSE REDUCTION: This exam was performed according to the departmental dose-optimization program which includes automated exposure control, adjustment of the mA and/or kV according to patient size and/or use of iterative reconstruction technique. COMPARISON:  None Available. FINDINGS: Brain: No evidence of acute infarction, hemorrhage, hydrocephalus, extra-axial collection or mass lesion/mass effect. Vascular: No hyperdense vessel. Skull: No acute  fracture. Sinuses/Orbits: Mild paranasal sinus mucosal thickening. No acute orbital findings. Other: No mastoid effusions. IMPRESSION: No acute intracranial abnormality. Electronically Signed   By: Feliberto Harts M.D.   On: 12/08/2022 17:18    Procedures Procedures    Medications Ordered in ED Medications  potassium chloride SA (KLOR-CON M) CR tablet 40 mEq (has no administration in time range)  ondansetron (ZOFRAN-ODT) disintegrating tablet 4 mg (4 mg Oral Given 12/09/22 2356)  metoCLOPramide (REGLAN) injection 10 mg (10 mg Intravenous Given 12/10/22 0015)  diphenhydrAMINE (BENADRYL) injection 25 mg (25 mg Intravenous Given 12/10/22 0014)  sodium chloride 0.9 % bolus 1,000 mL (0 mLs Intravenous Stopped 12/10/22 0154)  meclizine (ANTIVERT) tablet 25 mg (25 mg Oral Given 12/10/22 0104)  ketorolac (TORADOL) 15 MG/ML injection 15 mg (15 mg Intravenous Given 12/10/22 0104)  magnesium sulfate IVPB 2 g 50 mL (0 g Intravenous Stopped 12/10/22 0140)  morphine (PF) 4 MG/ML injection 4 mg (4 mg Intravenous Given 12/10/22 0212)  iohexol (OMNIPAQUE) 350 MG/ML injection 75 mL (75 mLs Intravenous Contrast Given 12/10/22 0202)    ED Course/ Medical Decision Making/ A&P Clinical Course as of 12/10/22 0506  Sat Dec 10, 2022  0200 Patient having several days of nausea vomiting and headache.  Was seen initially and treated medically, had potassium replacement.  Patient states her symptoms have not gotten better, not keeping medications down, states every time she vomits her head becomes more painful.  Does not feel like previous headaches.  She did not have any improvement with Reglan Benadryl, head CT  was normal, neuroexam was normal so given Toradol and magnesium.  After the Toradol patient states headache is still worsening, still very photophobic.  Discussed with her given severe symptoms will get CTA of head and neck to rule out other cause of headache [CB]  0323 CTA head and neck are normal, patient reevaluated  and was sleeping, woke up easily to voice and states she is feeling significantly better now.  States this is the first time she has been to follow-up sleep in several days. [CB]  [id number] Patient's headache has returned, patient was reevaluated by my Dr. Madilyn Hook.  Was ordered droperidol for her symptoms and we will order a CT venogram to rule out venous sinus thrombosis [CB]    Clinical Course User Index [CB] Ma Rings, PA-C                             Medical Decision Making This patient presents to the ED for concern of headache, nausea and vomiting, this involves an extensive number of treatment options, and is a complaint that carries with it a high risk of complications and morbidity.  The differential diagnosis includes migraine, tension headache, Intracranial hemorrhage, cluster headache, medication overuse headache, intracranial mass, venoussinus thrombosis, vertebral artery dissection, other    Co morbidities that complicate the patient evaluation  GERD   Additional history obtained:  Additional history obtained from EMR External records from outside source obtained and reviewed including recent ED visit   Lab Tests:  I Ordered, and personally interpreted labs.  The pertinent results include: No leukocytosis on CBC, mild anemia, negative pregnancy, sodium potassium and chloride all slightly low, lipase is normal, UA shows elevated ketones and protein in the urine with greater than 50 white blood cells but 21-50 squamous cells, likely contaminated    Imaging Studies ordered:  CT head without contrast interpreted by me no intracranial hemorrhage, no mass effect, no acute findings agree with radiology interpretation CTA head and neck by me, interpreted by radiology, no acute findings I agree with the radiologist interpretation  Consult: Discussed patient's care with Dr. Derry Lory Problem List / ED Course / Critical interventions / Medication management  Having nausea and  vomiting for the past 4 days, states she is continuing to vomit today despite medications and states every time she vomited her headache got worse.  She is no nuchal rigidity, no neurologic deficits on exam, denies any weakness, reports she has some tingling in the left fingertips earlier today that resolved but denies any weakness with this.  Denies any head trauma.  Has history of headaches but nothing like this week which is global and described as throbbing. She is afebrile, vitals are reassuring, she appears very uncomfortable, initially given Reglan and Benadryl and obtain CT scan is normal, patient has been having headache after this given Toradol magnesium ordered as well.  I was informed after patient got Toradol by nurse that headache had worsened, discussed my attending at this time, decided to get CTA head and neck to rule out any vascular cause of this make..  She is given morphine at that time, on reassessment patient's pain is significantly improved, she was able to fall asleep.  She has been able to tolerate p.o., we discussed her potassium which is better than a couple days ago but still low.  She has potassium prescribed.  She had CT abdomen pelvis as well as a CTA of her chest on her  visit on 4/11 so do not feel repeat scan needed at this time, she states her primary complaint was a headache.  This may be due to dehydration and muscle strain from vomiting.  Will give her some additional antiemetics, this follow-up and strict return precautions.  I have reviewed the patients home medicines and have made adjustments as needed  Patient reevaluated prior to discharge and was having recurrence of headache, back to original pain level.  Discussed potential CTV with attending but radiology states that no need as they can rule this anastomosis out based on her CTA as image quality was sufficient.  Under continued pain I consulted neurology and discussed what was done.  They state that they  recommend giving her dexamethasone, another fluid bolus,1000 milligrams of Depakote, 10 mg of Compazine, and 25 mg of Benadryl all in close proximity time.  Signed out to oncoming team will reassess after treatment    Amount and/or Complexity of Data Reviewed Labs: ordered. Radiology: ordered.  Risk Prescription drug management.           Final Clinical Impression(s) / ED Diagnoses Final diagnoses:  Vomiting, unspecified vomiting type, unspecified whether nausea present  Acute nonintractable headache, unspecified headache type    Rx / DC Orders ED Discharge Orders          Ordered    promethazine (PHENERGAN) 25 MG suppository  Every 6 hours PRN        12/10/22 0435              Ma Rings, PA-C 12/10/22 0641    Ma Rings, PA-C 12/10/22 1610    Tilden Fossa, MD 12/10/22 2246

## 2022-12-10 NOTE — Discharge Instructions (Addendum)
Evaluation for your nausea vomiting and headache was overall reassuring.  Your scans were negative.  If you develop a fever, visual disturbance, stiffness in your neck, altered mental status, peripheral numbness or weakness in your extremities, facial droop or slurred speech, unsteady gait or any other concerning symptom please return emergency department further evaluation.

## 2022-12-10 NOTE — ED Provider Notes (Addendum)
Accepted handoff at shift change from [facility], [place]. Please see prior provider note for more detail.   Briefly: Patient is 27 y.o. with history of GERD presenting for nausea, vomiting abdominal pain chills body aches and a headache.  After treatment patient stated that symptoms have improved aside from her headache which has remained unchanged in terms of severity.  This prompted further evaluation with CTA of head and neck which was negative.  DDX: concern for migraine, tension headache, Intracranial hemorrhage, cluster headache, medication overuse headache, intracranial mass, venoussinus thrombosis, vertebral artery dissection, other   Plan: another HA cocktail, reassess, if feeling better can go home, if no change reach out to neuro    Physical Exam  BP (!) 141/79   Pulse 65   Temp (!) 97.5 F (36.4 C)   Resp 16   Ht 5\' 3"  (1.6 m)   Wt 96 kg   SpO2 99%   BMI 37.49 kg/m   Physical Exam  Procedures  Procedures  ED Course / MDM   Clinical Course as of 12/10/22 1213  Sat Dec 10, 2022  0200 Patient having several days of nausea vomiting and headache.  Was seen initially and treated medically, had potassium replacement.  Patient states her symptoms have not gotten better, not keeping medications down, states every time she vomits her head becomes more painful.  Does not feel like previous headaches.  She did not have any improvement with Reglan Benadryl, head CT was normal, neuroexam was normal so given Toradol and magnesium.  After the Toradol patient states headache is still worsening, still very photophobic.  Discussed with her given severe symptoms will get CTA of head and neck to rule out other cause of headache [CB]  0323 CTA head and neck are normal, patient reevaluated and was sleeping, woke up easily to voice and states she is feeling significantly better now.  States this is the first time she has been to follow-up sleep in several days. [CB]  [id number] Patient's headache  has returned, patient was reevaluated by my Dr. Madilyn Hook.  Was ordered droperidol for her symptoms and we will order a CT venogram to rule out venous sinus thrombosis [CB]  [phone] Discussed patient with Dr. Bing Neighbors who advised CT venogram, if negative will likely have to plan for lumbar puncture.  [JR]    Clinical Course User Index [CB] Ma Rings, PA-C [JR] Gareth Eagle, PA-C   Medical Decision Making Amount and/or Complexity of Data Reviewed Labs: ordered. Radiology: ordered.  Risk Prescription drug management.   Reassessed patient after second headache cocktail and patient stated that headache was still persistent.  At this point contacted Dr. Bing Neighbors who recommended CT venogram.  If negative, advised potential LP for evaluation of meningitis versus SAH.  CT venogram was negative.  Checked on patient again and she stated that her headache had improved and that she was overall feeling better and ready to be discharged.  We discussed the utility of lumbar puncture and she was emphatic about foregoing the procedure for now.  We discussed pertinent return precautions to which she replied that she would certainly come back if things changed clinically for her.  Advised her to follow-up with neurology for her headache.  Vital stable at discharge.  Sent meclizine and Phenergan to her pharmacy per patient request.      Gareth Eagle, PA-C 12/10/22 1238    Terald Sleeper, MD 12/10/22 [phone]

## 2022-12-10 NOTE — ED Notes (Signed)
Patient transported to CT 

## 2022-12-12 ENCOUNTER — Encounter (HOSPITAL_COMMUNITY): Payer: Self-pay

## 2022-12-12 ENCOUNTER — Other Ambulatory Visit: Payer: Self-pay

## 2022-12-12 ENCOUNTER — Inpatient Hospital Stay (HOSPITAL_COMMUNITY)
Admission: EM | Admit: 2022-12-12 | Discharge: 2022-12-14 | DRG: 641 | Disposition: A | Discharge status: Home / Self Care | Payer: Medicaid Other | Attending: Internal Medicine | Admitting: Internal Medicine

## 2022-12-12 ENCOUNTER — Emergency Department (HOSPITAL_COMMUNITY): Payer: Medicaid Other

## 2022-12-12 DIAGNOSIS — Z825 Family history of asthma and other chronic lower respiratory diseases: Secondary | ICD-10-CM

## 2022-12-12 DIAGNOSIS — N200 Calculus of kidney: Secondary | ICD-10-CM | POA: Diagnosis present

## 2022-12-12 DIAGNOSIS — G43909 Migraine, unspecified, not intractable, without status migrainosus: Secondary | ICD-10-CM | POA: Diagnosis present

## 2022-12-12 DIAGNOSIS — E669 Obesity, unspecified: Secondary | ICD-10-CM | POA: Diagnosis present

## 2022-12-12 DIAGNOSIS — Z6837 Body mass index (BMI) 37.0-37.9, adult: Secondary | ICD-10-CM | POA: Diagnosis not present

## 2022-12-12 DIAGNOSIS — G4452 New daily persistent headache (NDPH): Secondary | ICD-10-CM | POA: Diagnosis not present

## 2022-12-12 DIAGNOSIS — K219 Gastro-esophageal reflux disease without esophagitis: Secondary | ICD-10-CM | POA: Diagnosis present

## 2022-12-12 DIAGNOSIS — Z833 Family history of diabetes mellitus: Secondary | ICD-10-CM

## 2022-12-12 DIAGNOSIS — Z79899 Other long term (current) drug therapy: Secondary | ICD-10-CM | POA: Diagnosis not present

## 2022-12-12 DIAGNOSIS — D649 Anemia, unspecified: Secondary | ICD-10-CM | POA: Diagnosis present

## 2022-12-12 DIAGNOSIS — Z1152 Encounter for screening for COVID-19: Secondary | ICD-10-CM

## 2022-12-12 DIAGNOSIS — E876 Hypokalemia: Principal | ICD-10-CM | POA: Diagnosis present

## 2022-12-12 DIAGNOSIS — Z8249 Family history of ischemic heart disease and other diseases of the circulatory system: Secondary | ICD-10-CM

## 2022-12-12 LAB — CBC WITH DIFFERENTIAL/PLATELET
Abs Immature Granulocytes: 0.12 10*3/uL — ABNORMAL HIGH (ref 0.00–0.07)
Basophils Absolute: 0 10*3/uL (ref 0.0–0.1)
Basophils Relative: 1 %
Eosinophils Absolute: 0 10*3/uL (ref 0.0–0.5)
Eosinophils Relative: 0 %
HCT: 36.4 % (ref 36.0–46.0)
Hemoglobin: 12.4 g/dL (ref 12.0–15.0)
Immature Granulocytes: 2 %
Lymphocytes Relative: 38 %
Lymphs Abs: 2.6 10*3/uL (ref 0.7–4.0)
MCH: 31.3 pg (ref 26.0–34.0)
MCHC: 34.1 g/dL (ref 30.0–36.0)
MCV: 91.9 fL (ref 80.0–100.0)
Monocytes Absolute: 0.8 10*3/uL (ref 0.1–1.0)
Monocytes Relative: 12 %
Neutro Abs: 3.3 10*3/uL (ref 1.7–7.7)
Neutrophils Relative %: 47 %
Platelets: 387 10*3/uL (ref 150–400)
RBC: 3.96 MIL/uL (ref 3.87–5.11)
RDW: 11.7 % (ref 11.5–15.5)
WBC: 6.8 10*3/uL (ref 4.0–10.5)
nRBC: 0 % (ref 0.0–0.2)

## 2022-12-12 LAB — COMPREHENSIVE METABOLIC PANEL
ALT: 13 U/L (ref 0–44)
AST: 15 U/L (ref 15–41)
Albumin: 3.1 g/dL — ABNORMAL LOW (ref 3.5–5.0)
Alkaline Phosphatase: 45 U/L (ref 38–126)
Anion gap: 8 (ref 5–15)
BUN: <5 mg/dL — ABNORMAL LOW (ref 6–20)
CO2: 28 mmol/L (ref 22–32)
Calcium: 7.9 mg/dL — ABNORMAL LOW (ref 8.9–10.3)
Chloride: 101 mmol/L (ref 98–111)
Creatinine, Ser: 0.67 mg/dL (ref 0.44–1.00)
GFR, Estimated: >60 mL/min (ref >60)
Glucose, Bld: 101 mg/dL — ABNORMAL HIGH (ref 70–99)
Potassium: 2.6 mmol/L — CL (ref 3.5–5.1)
Sodium: 137 mmol/L (ref 135–145)
Total Bilirubin: 0.9 mg/dL (ref 0.3–1.2)
Total Protein: 7 g/dL (ref 6.5–8.1)

## 2022-12-12 LAB — URINALYSIS, ROUTINE W REFLEX MICROSCOPIC
Bacteria, UA: NONE SEEN
Bilirubin Urine: NEGATIVE
Glucose, UA: NEGATIVE mg/dL
Hgb urine dipstick: NEGATIVE
Ketones, ur: NEGATIVE mg/dL
Nitrite: NEGATIVE
Protein, ur: NEGATIVE mg/dL
Specific Gravity, Urine: 1.002 — ABNORMAL LOW (ref 1.005–1.030)
pH: 7 (ref 5.0–8.0)

## 2022-12-12 LAB — SARS CORONAVIRUS 2 BY RT PCR: SARS Coronavirus 2 by RT PCR: NEGATIVE

## 2022-12-12 LAB — RESP PANEL BY RT-PCR (RSV, FLU A&B, COVID)  RVPGX2
Influenza A by PCR: NEGATIVE
Influenza B by PCR: NEGATIVE
Resp Syncytial Virus by PCR: NEGATIVE
SARS Coronavirus 2 by RT PCR: NEGATIVE

## 2022-12-12 LAB — CBC
HCT: 32.8 % — ABNORMAL LOW (ref 36.0–46.0)
Hemoglobin: 11 g/dL — ABNORMAL LOW (ref 12.0–15.0)
MCH: 31.1 pg (ref 26.0–34.0)
MCHC: 33.5 g/dL (ref 30.0–36.0)
MCV: 92.7 fL (ref 80.0–100.0)
Platelets: 388 10*3/uL (ref 150–400)
RBC: 3.54 MIL/uL — ABNORMAL LOW (ref 3.87–5.11)
RDW: 11.9 % (ref 11.5–15.5)
WBC: 7 10*3/uL (ref 4.0–10.5)
nRBC: 0 % (ref 0.0–0.2)

## 2022-12-12 LAB — CK: Total CK: 46 U/L (ref 38–234)

## 2022-12-12 LAB — GLUCOSE, CAPILLARY: Glucose-Capillary: 100 mg/dL — ABNORMAL HIGH (ref 70–99)

## 2022-12-12 MED ORDER — DIPHENHYDRAMINE HCL 50 MG/ML IJ SOLN
25.0000 mg | Freq: Four times a day (QID) | INTRAMUSCULAR | Status: DC | PRN
  Administered 2022-12-12 – 2022-12-14 (×5): 25 mg via INTRAVENOUS
  Filled 2022-12-12 (×5): qty 1

## 2022-12-12 MED ORDER — LIDOCAINE 5 % EX PTCH
1.0000 | MEDICATED_PATCH | CUTANEOUS | Status: DC
  Administered 2022-12-12 – 2022-12-13 (×2): 1 via TRANSDERMAL
  Filled 2022-12-12 (×3): qty 1

## 2022-12-12 MED ORDER — INSULIN ASPART 100 UNIT/ML IJ SOLN
0.0000 [IU] | Freq: Three times a day (TID) | INTRAMUSCULAR | Status: DC
  Filled 2022-12-12: qty 0.09

## 2022-12-12 MED ORDER — KETOROLAC TROMETHAMINE 15 MG/ML IJ SOLN
15.0000 mg | Freq: Once | INTRAMUSCULAR | Status: AC
  Administered 2022-12-12: 15 mg via INTRAVENOUS
  Filled 2022-12-12: qty 1

## 2022-12-12 MED ORDER — POTASSIUM CHLORIDE 10 MEQ/100ML IV SOLN
10.0000 meq | Freq: Once | INTRAVENOUS | Status: AC
  Administered 2022-12-12: 10 meq via INTRAVENOUS
  Filled 2022-12-12: qty 100

## 2022-12-12 MED ORDER — PROCHLORPERAZINE EDISYLATE 10 MG/2ML IJ SOLN
10.0000 mg | INTRAMUSCULAR | Status: DC | PRN
  Administered 2022-12-12 – 2022-12-14 (×4): 10 mg via INTRAVENOUS
  Filled 2022-12-12 (×4): qty 2

## 2022-12-12 MED ORDER — POTASSIUM CHLORIDE 2 MEQ/ML IV SOLN
INTRAVENOUS | Status: DC
  Filled 2022-12-12 (×3): qty 1000

## 2022-12-12 MED ORDER — KETOROLAC TROMETHAMINE 30 MG/ML IJ SOLN
30.0000 mg | Freq: Four times a day (QID) | INTRAMUSCULAR | Status: DC | PRN
  Administered 2022-12-12 – 2022-12-14 (×5): 30 mg via INTRAVENOUS
  Filled 2022-12-12 (×5): qty 1

## 2022-12-12 MED ORDER — ONDANSETRON HCL 4 MG/2ML IJ SOLN: 4.0000 mg | Freq: Four times a day (QID) | INTRAMUSCULAR | Status: DC | PRN

## 2022-12-12 MED ORDER — PROCHLORPERAZINE EDISYLATE 10 MG/2ML IJ SOLN
10.0000 mg | Freq: Once | INTRAMUSCULAR | Status: AC
  Administered 2022-12-12: 10 mg via INTRAVENOUS
  Filled 2022-12-12: qty 2

## 2022-12-12 MED ORDER — ACETAMINOPHEN 650 MG RE SUPP: 650.0000 mg | Freq: Four times a day (QID) | RECTAL | Status: DC | PRN

## 2022-12-12 MED ORDER — ONDANSETRON HCL 4 MG PO TABS: 4.0000 mg | ORAL_TABLET | Freq: Four times a day (QID) | ORAL | Status: DC | PRN

## 2022-12-12 MED ORDER — LACTATED RINGERS IV BOLUS
1000.0000 mL | Freq: Once | INTRAVENOUS | Status: AC
  Administered 2022-12-12: 1000 mL via INTRAVENOUS

## 2022-12-12 MED ORDER — ACETAMINOPHEN 325 MG PO TABS
650.0000 mg | ORAL_TABLET | Freq: Four times a day (QID) | ORAL | Status: DC | PRN
  Administered 2022-12-14: 650 mg via ORAL
  Filled 2022-12-12: qty 2

## 2022-12-12 MED ORDER — MAGNESIUM OXIDE -MG SUPPLEMENT 400 (240 MG) MG PO TABS
800.0000 mg | ORAL_TABLET | Freq: Once | ORAL | Status: AC
  Administered 2022-12-12: 800 mg via ORAL
  Filled 2022-12-12: qty 2

## 2022-12-12 MED ORDER — HEPARIN SODIUM (PORCINE) 5000 UNIT/ML IJ SOLN
5000.0000 [IU] | Freq: Three times a day (TID) | INTRAMUSCULAR | Status: DC
  Administered 2022-12-12 – 2022-12-14 (×5): 5000 [IU] via SUBCUTANEOUS
  Filled 2022-12-12 (×6): qty 1

## 2022-12-12 MED ORDER — POTASSIUM CHLORIDE CRYS ER 20 MEQ PO TBCR
40.0000 meq | EXTENDED_RELEASE_TABLET | Freq: Once | ORAL | Status: AC
  Administered 2022-12-12: 40 meq via ORAL
  Filled 2022-12-12: qty 2

## 2022-12-12 MED ORDER — DIPHENHYDRAMINE HCL 50 MG/ML IJ SOLN
25.0000 mg | Freq: Once | INTRAMUSCULAR | Status: AC
  Administered 2022-12-12: 25 mg via INTRAVENOUS
  Filled 2022-12-12: qty 1

## 2022-12-12 NOTE — H&P (Incomplete)
History and Physical    Annette Juarez:[id number] DOB: 1996-07-14 DOA: 12/12/2022  PCP: Patient, No Pcp Per  Patient coming from:  home  I have personally briefly reviewed patient's old medical records in [facility]  Chief Complaint:  HA ,n/v  HPI: Annette Juarez is a 27 y.o. female with medical history significant of  DMII, GERD,  hx of UTI, recent interim history of recurrent HA  with n/v for which she has been seen in the ED twice in the last week.  Patient has responded to migraine cocktails and has been able to be sent home s/p po challenge.  Patient returns to ED today with recurrent symptoms of HA with n/v and is now found to have associated hypokalemia as well  as fever.  Of note on prior ED visits patient had CTH, CTA, CTAB /pelvis, CTA of head and neck as well as CT venogram which were all unremarkable.  ED Course:  Vitals: temp 100.4, bp 149/84,  hr 110, rr 18  sat 100%  Wbc 6.8, hgb 12.4, [id number]   Na 137, K 2.6, CL101, CO2 28 , Glu 101 cr 0.67  CT renal stone protocol  unctate nonobstructing left-sided renal stone.  No ureteral stone.   No bowel obstruction, free air or free fluid.  Normal appendix  Tx ketoralc , Mag -ox , KLOr -con 40 meq  Lidocaine 5% patch  LR 1L  K 10 me1 ivpb    Review of Systems: As per HPI otherwise 10 point review of systems negative.   Past Medical History:  Diagnosis Date   Diabetes mellitus without complication    Gestational diet control   GERD (gastroesophageal reflux disease)    Gestational diabetes    UTI (urinary tract infection)     Past Surgical History:  Procedure Laterality Date   NO PAST SURGERIES     TUBAL LIGATION Bilateral 07/23/2018   Procedure: POST PARTUM TUBAL LIGATION;  Surgeon: Hermina Staggers, MD;  Location: [facility];  Service: Gynecology;  Laterality: Bilateral;     reports that she has never smoked. She has never used smokeless tobacco. She reports that she does not drink  alcohol and does not use drugs.  No Known Allergies  Family History  Problem Relation Age of Onset   Hypertension Mother    Diabetes Father    Asthma Sister    Diabetes Maternal Grandmother    Hypertension Maternal Grandmother    Hypertension Maternal Grandfather     Prior to Admission medications   Medication Sig Start Date End Date Taking? Authorizing Provider  famotidine (PEPCID) 20 MG tablet Take 1 tablet (20 mg total) by mouth 2 (two) times daily for 7 days. 12/09/22 12/16/22  Carroll Sage, PA-C  ibuprofen (ADVIL,MOTRIN) 800 MG tablet Take 1 tablet (800 mg total) by mouth every 8 (eight) hours as needed. 07/25/18   Rolm Bookbinder, CNM  meclizine (ANTIVERT) 12.5 MG tablet Take 1 tablet (12.5 mg total) by mouth 3 (three) times daily as needed for dizziness. 12/10/22   Gareth Eagle, PA-C  methocarbamol (ROBAXIN) 500 MG tablet Take 1 tablet (500 mg total) by mouth 2 (two) times daily. 05/05/21   Garlon Hatchet, PA-C  naproxen (NAPROSYN) 500 MG tablet Take 1 tablet (500 mg total) by mouth 2 (two) times daily with a meal. 05/05/21   Garlon Hatchet, PA-C  ondansetron (ZOFRAN) 4 MG tablet Take 1 tablet (4 mg total) by mouth every 6 (  six) hours. 12/09/22   Carroll Sage, PA-C  potassium chloride (KLOR-CON) 10 MEQ tablet Take 1 tablet (10 mEq total) by mouth daily for 5 days. 12/09/22 12/14/22  Carroll Sage, PA-C  Prenatal-Fe Fum-Methf-FA w/o A (VITAFOL-NANO) 18-0.6-0.4 MG TABS Take 1 tablet by mouth daily. 01/23/18   Roe Coombs, CNM  promethazine (PHENERGAN) 25 MG tablet Take 1 tablet (25 mg total) by mouth every 6 (six) hours as needed for nausea or vomiting. 12/10/22   Gareth Eagle, PA-C    Physical Exam: Vitals:   12/12/22 1734 12/12/22 1735 12/12/22 1745 12/12/22 2100  BP: (!) 149/84  134/81 (!) 144/87  Pulse: (!) 110  (!) 103 96  Resp: 18  20 (!) 23  Temp: (!) 100.4 F (38 C)     TempSrc: Oral     SpO2: 100%  100% 100%  Weight:  95.7 kg    Height:   5\' 3"  (1.6 m)      Constitutional: NAD, calm, comfortable Vitals:   12/12/22 1734 12/12/22 1735 12/12/22 1745 12/12/22 2100  BP: (!) 149/84  134/81 (!) 144/87  Pulse: (!) 110  (!) 103 96  Resp: 18  20 (!) 23  Temp: (!) 100.4 F (38 C)     TempSrc: Oral     SpO2: 100%  100% 100%  Weight:  95.7 kg    Height:  5\' 3"  (1.6 m)     Eyes: PERRL, lids and conjunctivae normal ENMT: Mucous membranes are moist. Posterior pharynx clear of any exudate or lesions.Normal dentition.  Neck: normal, supple, no masses, no thyromegaly Respiratory: clear to auscultation bilaterally, no wheezing, no crackles. Normal respiratory effort. No accessory muscle use.  Cardiovascular: Regular rate and rhythm, no murmurs / rubs / gallops. No extremity edema. 2+ pedal pulses. No carotid bruits.  Abdomen: no tenderness, no masses palpated. No hepatosplenomegaly. Bowel sounds positive.  Musculoskeletal: no clubbing / cyanosis. No joint deformity upper and lower extremities. Good ROM, no contractures. Normal muscle tone.  Skin: no rashes, lesions, ulcers. No induration Neurologic: CN 2-12 grossly intact. Sensation intact, DTR normal. Strength 5/5 in all 4.  Psychiatric: Normal judgment and insight. Alert and oriented x 3. Normal mood.    Labs on Admission: I have personally reviewed following labs and imaging studies  CBC: Recent Labs  Lab 12/08/22 2220 12/10/22 0000 12/12/22 1754  WBC 5.9 6.5 6.8  NEUTROABS  --   --  3.3  HGB 12.6 11.5* 12.4  HCT 37.3 33.3* 36.4  MCV 91.6 91.0 91.9  PLT 389 384 387   Basic Metabolic Panel: Recent Labs  Lab 12/08/22 2220 12/10/22 0000 12/12/22 1933  NA 135 131* 137  K 2.8* 3.1* 2.6*  CL 99 97* 101  CO2 24 24 28   GLUCOSE 99 155* 101*  BUN <5* 7 <5*  CREATININE 0.65 0.74 0.67  CALCIUM 8.9 8.5* 7.9*  MG 2.3  --   --    GFR: Estimated Creatinine Clearance: 116.2 mL/min (by C-G formula based on SCr of 0.67 mg/dL). Liver Function Tests: Recent Labs  Lab  12/08/22 2220 12/10/22 0000 12/12/22 1933  AST 20 24 15   ALT 11 14 13   ALKPHOS 59 54 45  BILITOT 1.2 0.8 0.9  PROT 8.3* 8.3* 7.0  ALBUMIN 3.6 3.7 3.1*   Recent Labs  Lab 12/08/22 2220 12/10/22 0000  LIPASE 24 26   No results for input(s): "AMMONIA" in the last 168 hours. Coagulation Profile: No results for input(s): "INR", "PROTIME" in  the last 168 hours. Cardiac Enzymes: Recent Labs  Lab 12/12/22 1933  CKTOTAL 46   BNP (last 3 results) No results for input(s): "PROBNP" in the last 8760 hours. HbA1C: No results for input(s): "HGBA1C" in the last 72 hours. CBG: No results for input(s): "GLUCAP" in the last 168 hours. Lipid Profile: No results for input(s): "CHOL", "HDL", "LDLCALC", "TRIG", "CHOLHDL", "LDLDIRECT" in the last 72 hours. Thyroid Function Tests: No results for input(s): "TSH", "T4TOTAL", "FREET4", "T3FREE", "THYROIDAB" in the last 72 hours. Anemia Panel: No results for input(s): "VITAMINB12", "FOLATE", "FERRITIN", "TIBC", "IRON", "RETICCTPCT" in the last 72 hours. Urine analysis:    Component Value Date/Time   COLORURINE YELLOW 12/09/2022 2010   APPEARANCEUR TURBID (A) 12/09/2022 2010   LABSPEC 1.024 12/09/2022 2010   PHURINE 6.0 12/09/2022 2010   GLUCOSEU NEGATIVE 12/09/2022 2010   HGBUR NEGATIVE 12/09/2022 2010   BILIRUBINUR NEGATIVE 12/09/2022 2010   BILIRUBINUR neg 07/02/2018 1055   KETONESUR 20 (A) 12/09/2022 2010   PROTEINUR 30 (A) 12/09/2022 2010   UROBILINOGEN 0.2 07/02/2018 1055   UROBILINOGEN 1.0 12/27/2014 1716   NITRITE NEGATIVE 12/09/2022 2010   LEUKOCYTESUR NEGATIVE 12/09/2022 2010    Radiological Exams on Admission: CT Renal Stone Study  Result Date: 12/12/2022 CLINICAL DATA:  Lower back pain for a week. Improving nausea vomiting. EXAM: CT ABDOMEN AND PELVIS WITHOUT CONTRAST TECHNIQUE: Multidetector CT imaging of the abdomen and pelvis was performed following the standard protocol without IV contrast. RADIATION DOSE REDUCTION: This  exam was performed according to the departmental dose-optimization program which includes automated exposure control, adjustment of the mA and/or kV according to patient size and/or use of iterative reconstruction technique. COMPARISON:  Contrast CT 12/08/2022 FINDINGS: Lower chest: Lung bases are clear. Trace pericardial fluid versus thickening. Hepatobiliary: On this non IV contrast exam, the liver is grossly preserved. The gallbladder is nondilated. Pancreas: Unremarkable. No pancreatic ductal dilatation or surrounding inflammatory changes. Spleen: Normal in size without focal abnormality. Adrenals/Urinary Tract: The adrenal glands are preserved. There is punctate nonobstructing lower pole left-sided renal stone. No right-sided renal stone. No ureteral stones. Preserved contours of the urinary bladder. Stomach/Bowel: Focal areas of fatty infiltration or lipomas along the sigmoid colon. The large bowel on this non oral contrast exam has a normal course and caliber with mild stool. Normal appendix extends medial to the cecum in the right hemipelvis. Small bowel is nondilated. Vascular/Lymphatic: Normal caliber aorta and IVC. No specific abnormal lymph node enlargement seen in the abdomen and pelvis. Reproductive: Uterus and ovaries are grossly preserved. There are presumed tubal ligation clips in the left midabdomen and pelvis. Please correlate with the history. Other: No free air or free fluid. Only trace stranding in the low pelvis, nonspecific and could be physiologic. Musculoskeletal: Chronic deformity of the left iliac crest. Minimal osteophytes along the lower thoracic spine. IMPRESSION: Punctate nonobstructing left-sided renal stone.  No ureteral stone. No bowel obstruction, free air or free fluid.  Normal appendix. Electronically Signed   By: Karen Kays M.D.   On: 12/12/2022 18:32    EKG: Independently reviewed. Pending    Assessment/Plan   Recurrent HA , presumed Migraine syndrome  - admit to  med tele  -s/p migraine cocktail with improvement  - place on toradol  , benadyl, compazine, prn   Hypokalemia -in setting of n/v  - admit to tele - monitor labs  -repeat electrolytes prn    Fever nos  -r/o infection  -chest imaging negative  - check repeat UA  DMII -iss/fs     GERD -ppi     hx of UTI -ua pending        DVT prophylaxis: heparin Code Status: full/ as discussed per patient wishes in event of cardiac arrest  Family Communication:  Disposition Plan: patient  expected to be admitted greater than 2 midnights  Consults called:  neurology Admission status: progressive /med/tele   Lurline Del MD [facility]   If 7PM-7AM, please contact night-coverage [email or web address] Password [facility]  12/12/2022, 9:12 PM

## 2022-12-12 NOTE — ED Triage Notes (Signed)
Pt BIBA from home. Pt c/o headache, lower back pain x 1 week. Pt states N/V/D has stopped, but has continued to be weak.  134/70 98 HR  98%

## 2022-12-12 NOTE — ED Notes (Signed)
ED TO INPATIENT HANDOFF REPORT  ED Nurse Name and Phone #: Leatrice Jewels RN  S Name/Age/Gender Annette Juarez 27 y.o. female Room/Bed: WA01/WA01  Code Status   Code Status: Full Code  Home/SNF/Other Home Patient oriented to: self, place, time, and situation Is this baseline? Yes   Triage Complete: Triage complete  Chief Complaint Hypokalemia [E87.6]  Triage Note Pt BIBA from home. Pt c/o headache, lower back pain x 1 week. Pt states N/V/D has stopped, but has continued to be weak.  134/70 98 HR  98%    Allergies No Known Allergies  Level of Care/Admitting Diagnosis ED Disposition     ED Disposition  Admit   Condition  --   Comment  Hospital Area: [facility] [[id number]]  Level of Care: Progressive [102]  Admit to Progressive based on following criteria: MULTISYSTEM THREATS such as stable sepsis, metabolic/electrolyte imbalance with or without encephalopathy that is responding to early treatment.  May admit patient to [facility] or [facility] if equivalent level of care is available:: No  Covid Evaluation: Confirmed COVID Negative  Diagnosis: Hypokalemia [161096]  Admitting Physician: Lurline Del [[id number]]  Attending Physician: Lurline Del [[id number]]  Certification:: I certify this patient will need inpatient services for at least 2 midnights  Estimated Length of Stay: 3          B Medical/Surgery History Past Medical History:  Diagnosis Date   Diabetes mellitus without complication    Gestational diet control   GERD (gastroesophageal reflux disease)    Gestational diabetes    UTI (urinary tract infection)    Past Surgical History:  Procedure Laterality Date   NO PAST SURGERIES     TUBAL LIGATION Bilateral 07/23/2018   Procedure: POST PARTUM TUBAL LIGATION;  Surgeon: Hermina Staggers, MD;  Location: [facility];  Service: Gynecology;  Laterality: Bilateral;     A IV Location/Drains/Wounds Patient  Lines/Drains/Airways Status     Active Line/Drains/Airways     Name Placement date Placement time Site Days   Peripheral IV 12/12/22 20 G Right Antecubital 12/12/22  1800  Antecubital  less than 1            Intake/Output Last 24 hours No intake or output data in the 24 hours ending 12/12/22 2151  Labs/Imaging Results for orders placed or performed during the hospital encounter of 12/12/22 (from the past 48 hour(s))  SARS Coronavirus 2 by RT PCR (hospital order, performed in [facility] hospital lab) *cepheid single result test*     Status: None   Collection Time: 12/12/22  5:29 PM  Result Value Ref Range   SARS Coronavirus 2 by RT PCR NEGATIVE NEGATIVE    Comment: (NOTE) SARS-CoV-2 target nucleic acids are NOT DETECTED.  The SARS-CoV-2 RNA is generally detectable in upper and lower respiratory specimens during the acute phase of infection. The lowest concentration of SARS-CoV-2 viral copies this assay can detect is 250 copies / mL. A negative result does not preclude SARS-CoV-2 infection and should not be used as the sole basis for treatment or other patient management decisions.  A negative result may occur with improper specimen collection / handling, submission of specimen other than nasopharyngeal swab, presence of viral mutation(s) within the areas targeted by this assay, and inadequate number of viral copies (<250 copies / mL). A negative result must be combined with clinical observations, patient history, and epidemiological information.  Fact Sheet for Patients:   RoadLapTop.co.za  Fact Sheet for Healthcare  Providers: http://kim-miller.com/  This test is not yet approved or  cleared by the [place] and has been authorized for detection and/or diagnosis of SARS-CoV-2 by FDA under an Emergency Use Authorization (EUA).  This EUA will remain in effect (meaning this test can be used) for the duration of  the COVID-19 declaration under Section 564(b)(1) of the Act, 21 U.S.C. section 360bbb-3(b)(1), unless the authorization is terminated or revoked sooner.  Performed at [facility], [address] [place], [place] [address]   CBC with Differential     Status: Abnormal   Collection Time: 12/12/22  5:54 PM  Result Value Ref Range   WBC 6.8 4.0 - 10.5 K/uL   RBC 3.96 3.87 - 5.11 MIL/uL   Hemoglobin 12.4 12.0 - 15.0 g/dL   HCT 60.4 54.0 - 98.1 %   MCV 91.9 80.0 - 100.0 fL   MCH 31.3 26.0 - 34.0 pg   MCHC 34.1 30.0 - 36.0 g/dL   RDW 19.1 47.8 - 29.5 %   Platelets 387 150 - 400 K/uL   nRBC 0.0 0.0 - 0.2 %   Neutrophils Relative % 47 %   Neutro Abs 3.3 1.7 - 7.7 K/uL   Lymphocytes Relative 38 %   Lymphs Abs 2.6 0.7 - 4.0 K/uL   Monocytes Relative 12 %   Monocytes Absolute 0.8 0.1 - 1.0 K/uL   Eosinophils Relative 0 %   Eosinophils Absolute 0.0 0.0 - 0.5 K/uL   Basophils Relative 1 %   Basophils Absolute 0.0 0.0 - 0.1 K/uL   WBC Morphology VACUOLATED NEUTROPHILS     Comment: VARIANT LYMPHOCYTES   Immature Granulocytes 2 %   Abs Immature Granulocytes 0.12 (H) 0.00 - 0.07 K/uL   Polychromasia PRESENT     Comment: Performed at [facility], [address] [place], [place] [address]  Comprehensive metabolic panel     Status: Abnormal   Collection Time: 12/12/22  7:33 PM  Result Value Ref Range   Sodium 137 135 - 145 mmol/L   Potassium 2.6 (LL) 3.5 - 5.1 mmol/L    Comment: CRITICAL RESULT CALLED TO, READ BACK BY AND VERIFIED WITH Emmarie Sannes,L  12/12/22 BY CHILDRESS,E   Chloride 101 98 - 111 mmol/L   CO2 28 22 - 32 mmol/L   Glucose, Bld 101 (H) 70 - 99 mg/dL    Comment: Glucose reference range applies only to samples taken after fasting for at least 8 hours.   BUN <5 (L) 6 - 20 mg/dL   Creatinine, Ser 8.65 0.44 - 1.00 mg/dL   Calcium 7.9 (L) 8.9 - 10.3 mg/dL   Total Protein 7.0 6.5 - 8.1 g/dL   Albumin 3.1 (L) 3.5 - 5.0 g/dL   AST 15 15 -  41 U/L   ALT 13 0 - 44 U/L   Alkaline Phosphatase 45 38 - 126 U/L   Total Bilirubin 0.9 0.3 - 1.2 mg/dL   GFR, Estimated >78 >46 mL/min    Comment: (NOTE) Calculated using the CKD-EPI Creatinine Equation (2021)    Anion gap 8 5 - 15    Comment: Performed at [facility], [address] [place], [place] [address]  CK     Status: None   Collection Time: 12/12/22  7:33 PM  Result Value Ref Range   Total CK 46 38 - 234 U/L    Comment: Performed at [facility], [address] [place], [place] [address]   CT Renal  Stone Study  Result Date: 12/12/2022 CLINICAL DATA:  Lower back pain for a week. Improving nausea vomiting. EXAM: CT ABDOMEN AND PELVIS WITHOUT CONTRAST TECHNIQUE: Multidetector CT imaging of the abdomen and pelvis was performed following the standard protocol without IV contrast. RADIATION DOSE REDUCTION: This exam was performed according to the departmental dose-optimization program which includes automated exposure control, adjustment of the mA and/or kV according to patient size and/or use of iterative reconstruction technique. COMPARISON:  Contrast CT 12/08/2022 FINDINGS: Lower chest: Lung bases are clear. Trace pericardial fluid versus thickening. Hepatobiliary: On this non IV contrast exam, the liver is grossly preserved. The gallbladder is nondilated. Pancreas: Unremarkable. No pancreatic ductal dilatation or surrounding inflammatory changes. Spleen: Normal in size without focal abnormality. Adrenals/Urinary Tract: The adrenal glands are preserved. There is punctate nonobstructing lower pole left-sided renal stone. No right-sided renal stone. No ureteral stones. Preserved contours of the urinary bladder. Stomach/Bowel: Focal areas of fatty infiltration or lipomas along the sigmoid colon. The large bowel on this non oral contrast exam has a normal course and caliber with mild stool. Normal appendix extends medial to the cecum in the right  hemipelvis. Small bowel is nondilated. Vascular/Lymphatic: Normal caliber aorta and IVC. No specific abnormal lymph node enlargement seen in the abdomen and pelvis. Reproductive: Uterus and ovaries are grossly preserved. There are presumed tubal ligation clips in the left midabdomen and pelvis. Please correlate with the history. Other: No free air or free fluid. Only trace stranding in the low pelvis, nonspecific and could be physiologic. Musculoskeletal: Chronic deformity of the left iliac crest. Minimal osteophytes along the lower thoracic spine. IMPRESSION: Punctate nonobstructing left-sided renal stone.  No ureteral stone. No bowel obstruction, free air or free fluid.  Normal appendix. Electronically Signed   By: Karen Kays M.D.   On: 12/12/2022 18:32    Pending Labs Unresulted Labs (From admission, onward)     Start     Ordered   12/13/22 0500  Comprehensive metabolic panel  Tomorrow morning,   R        12/12/22 2122   12/13/22 0500  CBC  Tomorrow morning,   R        12/12/22 2122   12/12/22 2122  Hemoglobin A1c  Once,   R        12/12/22 2122   12/12/22 2116  Hemoglobin A1c  (Glycemic Control (SSI)  Q 4 Hours / Glycemic Control (SSI)  AC +/- HS)  Once,   R       Comments: To assess prior glycemic control    12/12/22 2122   12/12/22 2116  CBC  (heparin)  Once,   R       Comments: Baseline for heparin therapy IF NOT ALREADY DRAWN.  Notify MD if PLT < 100 K.    12/12/22 2122   12/12/22 2116  Creatinine, serum  (heparin)  Once,   R       Comments: Baseline for heparin therapy IF NOT ALREADY DRAWN.    12/12/22 2122   12/12/22 2115  HIV Antibody (routine testing w rflx)  (HIV Antibody (Routine testing w reflex) panel)  Once,   R        12/12/22 2122   12/12/22 2113  Sedimentation rate  Once,   URGENT        12/12/22 2112   12/12/22 2113  C-reactive protein  Once,   URGENT        12/12/22 2112   12/12/22 1745  Urinalysis, Routine  w reflex microscopic -Urine, Clean Catch  Once,   URGENT        Question:  Specimen Source  Answer:  Urine, Clean Catch   12/12/22 1744   12/12/22 1729  Resp panel by RT-PCR (RSV, Flu A&B, Covid)  Once,   R        12/12/22 1729            Vitals/Pain Today's Vitals   12/12/22 1735 12/12/22 1745 12/12/22 2100 12/12/22 2115  BP:  134/81 (!) 144/87 (!) 149/83  Pulse:  (!) 103 96 81  Resp:  20 (!) 23 (!) 27  Temp:      TempSrc:      SpO2:  100% 100% 98%  Weight: 95.7 kg     Height: 5\' 3"  (1.6 m)     PainSc: 10-Worst pain ever       Isolation Precautions No active isolations  Medications Medications  lidocaine (LIDODERM) 5 % 1 patch (1 patch Transdermal Patch Applied 12/12/22 2055)  potassium chloride 10 mEq in 100 mL IVPB (0 mEq Intravenous Paused 12/12/22 2149)  insulin aspart (novoLOG) injection 0-9 Units (has no administration in time range)  heparin injection 5,000 Units (has no administration in time range)  lactated ringers 1,000 mL with potassium chloride 40 mEq infusion (has no administration in time range)  acetaminophen (TYLENOL) tablet 650 mg (has no administration in time range)    Or  acetaminophen (TYLENOL) suppository 650 mg (has no administration in time range)  ondansetron (ZOFRAN) tablet 4 mg (has no administration in time range)    Or  ondansetron (ZOFRAN) injection 4 mg (has no administration in time range)  prochlorperazine (COMPAZINE) injection 10 mg (10 mg Intravenous Given 12/12/22 1831)  diphenhydrAMINE (BENADRYL) injection 25 mg (25 mg Intravenous Given 12/12/22 1831)  lactated ringers bolus 1,000 mL (0 mLs Intravenous Stopped 12/12/22 2057)  ketorolac (TORADOL) 15 MG/ML injection 15 mg (15 mg Intravenous Given 12/12/22 1831)  potassium chloride SA (KLOR-CON M) CR tablet 40 mEq (40 mEq Oral Given 12/12/22 2055)  magnesium oxide (MAG-OX) tablet 800 mg (800 mg Oral Given 12/12/22 2055)    Mobility walks     Focused Assessments     R Recommendations: See Admitting Provider Note  Report given to:    Additional Notes:

## 2022-12-12 NOTE — ED Provider Notes (Signed)
[facility] Provider Note  CSN: [id number] Arrival date & time: 12/12/22 1724  Chief Complaint(s) Headache  HPI Annette Juarez is a 27 y.o. female with PMH GERD, gestational diabetes who presents emergency room for evaluation of headache and back pain.  This is the third ER presentation for this patient since 12/08/2022 with symptoms initially starting with headache nausea and vomiting and workup most recently on 12/09/2022 has included CT angiography and CT venography both of which were reassuringly negative.  Previously, patient had received CT PE and CT abdomen pelvis that was also negative.  Today, she states the nausea and vomiting have resolved but she is having a persistent headache and now has a new symptom of left flank pain and feels like "she is passing a kidney stone".  Patient arrives febrile to 100.4 and minimally tachycardic.  There were discussions about a possible lumbar puncture last ER presentation but patient declined this procedure.  She denies chest pain, shortness of breath, vomiting or other systemic symptoms today.  Denies dysuria or vaginal bleeding.   Past Medical History Past Medical History:  Diagnosis Date   Diabetes mellitus without complication    Gestational diet control   GERD (gastroesophageal reflux disease)    Gestational diabetes    UTI (urinary tract infection)    Patient Active Problem List   Diagnosis Date Noted   Hypokalemia 12/12/2022   Gestational hypertension 07/22/2018   GBS (group B Streptococcus carrier), +RV culture, currently pregnant 07/05/2018   Supervision of other normal pregnancy, antepartum 04/10/2018   Late prenatal care affecting pregnancy 04/10/2018   History of gestational diabetes in prior pregnancy, currently pregnant 04/10/2018   Rh negative, antepartum 05/18/2016   Susceptible to varicella (non-immune), currently pregnant 05/12/2016   Obesity affecting pregnancy, antepartum 05/11/2016    Home Medication(s) Prior to Admission medications   Medication Sig Start Date End Date Taking? Authorizing Provider  acetaminophen-codeine (TYLENOL #3) 300-30 MG tablet Take 1 tablet by mouth 3 (three) times daily as needed for severe pain or moderate pain. 12/06/22  Yes [provider]  meclizine (ANTIVERT) 12.5 MG tablet Take 1 tablet (12.5 mg total) by mouth 3 (three) times daily as needed for dizziness. 12/10/22  Yes Gareth Eagle, PA-C  ondansetron (ZOFRAN) 4 MG tablet Take 1 tablet (4 mg total) by mouth every 6 (six) hours. 12/09/22  Yes Carroll Sage, PA-C  potassium chloride (KLOR-CON) 10 MEQ tablet Take 1 tablet (10 mEq total) by mouth daily for 5 days. 12/09/22 12/14/22 Yes Carroll Sage, PA-C  traMADol (ULTRAM) 50 MG tablet Take 50 mg by mouth 3 (three) times daily as needed for moderate pain or severe pain. 11/07/22  Yes [provider]  Vitamin D, Ergocalciferol, (DRISDOL) 1.25 MG (50000 UNIT) CAPS capsule Take 50,000 Units by mouth once a week. 12/06/22  Yes [provider]  famotidine (PEPCID) 20 MG tablet Take 1 tablet (20 mg total) by mouth 2 (two) times daily for 7 days. Patient not taking: Reported on 12/12/2022 12/09/22 12/16/22  Carroll Sage, PA-C  ibuprofen (ADVIL,MOTRIN) 800 MG tablet Take 1 tablet (800 mg total) by mouth every 8 (eight) hours as needed. Patient not taking: Reported on 12/12/2022 07/25/18   Rolm Bookbinder, CNM  methocarbamol (ROBAXIN) 500 MG tablet Take 1 tablet (500 mg total) by mouth 2 (two) times daily. Patient not taking: Reported on 12/12/2022 05/05/21   Garlon Hatchet, PA-C  naproxen (NAPROSYN) 500 MG tablet Take  1 tablet (500 mg total) by mouth 2 (two) times daily with a meal. Patient not taking: Reported on 12/12/2022 05/05/21   Garlon Hatchet, PA-C  Prenatal-Fe Fum-Methf-FA w/o A (VITAFOL-NANO) 18-0.6-0.4 MG TABS Take 1 tablet by mouth daily. Patient not taking: Reported on 12/12/2022 01/23/18   Roe Coombs, CNM  promethazine (PHENERGAN) 25 MG tablet Take 1 tablet (25 mg total) by mouth every 6 (six) hours as needed for nausea or vomiting. Patient not taking: Reported on 12/12/2022 12/10/22   Gareth Eagle, PA-C                                                                                                                                    Past Surgical History Past Surgical History:  Procedure Laterality Date   NO PAST SURGERIES     TUBAL LIGATION Bilateral 07/23/2018   Procedure: POST PARTUM TUBAL LIGATION;  Surgeon: Hermina Staggers, MD;  Location: [facility];  Service: Gynecology;  Laterality: Bilateral;   Family History Family History  Problem Relation Age of Onset   Hypertension Mother    Diabetes Father    Asthma Sister    Diabetes Maternal Grandmother    Hypertension Maternal Grandmother    Hypertension Maternal Grandfather     Social History Social History   Tobacco Use   Smoking status: Never   Smokeless tobacco: Never  Vaping Use   Vaping Use: Never used  Substance Use Topics   Alcohol use: No   Drug use: No   Allergies Patient has no known allergies.  Review of Systems Review of Systems  Constitutional:  Positive for chills, fatigue and fever.  Musculoskeletal:  Positive for arthralgias, back pain and myalgias.    Physical Exam Vital Signs  I have reviewed the triage vital signs BP (!) 149/83   Pulse 81   Temp (!) 100.4 F (38 C) (Oral)   Resp (!) 27   Ht 5\' 3"  (1.6 m)   Wt 95.7 kg   SpO2 98%   BMI 37.38 kg/m   Physical Exam Vitals and nursing note reviewed.  Constitutional:      General: She is not in acute distress.    Appearance: She is well-developed.  HENT:     Head: Normocephalic and atraumatic.  Eyes:     Conjunctiva/sclera: Conjunctivae normal.  Cardiovascular:     Rate and Rhythm: Normal rate and regular rhythm.     Heart sounds: No murmur heard. Pulmonary:     Effort: Pulmonary effort is normal. No respiratory  distress.  Musculoskeletal:        General: Tenderness present. No swelling.     Cervical back: Neck supple.  Skin:    General: Skin is warm and dry.     Capillary Refill: Capillary refill takes less than 2 seconds.  Neurological:     Mental Status: She is alert.  Psychiatric:  Mood and Affect: Mood normal.     ED Results and Treatments Labs (all labs ordered are listed, but only abnormal results are displayed) Labs Reviewed  CBC WITH DIFFERENTIAL/PLATELET - Abnormal; Notable for the following components:      Result Value   Abs Immature Granulocytes 0.12 (*)    All other components within normal limits  URINALYSIS, ROUTINE W REFLEX MICROSCOPIC - Abnormal; Notable for the following components:   Specific Gravity, Urine 1.002 (*)    Leukocytes,Ua TRACE (*)    All other components within normal limits  COMPREHENSIVE METABOLIC PANEL - Abnormal; Notable for the following components:   Potassium 2.6 (*)    Glucose, Bld 101 (*)    BUN <5 (*)    Calcium 7.9 (*)    Albumin 3.1 (*)    All other components within normal limits  SARS CORONAVIRUS 2 BY RT PCR  RESP PANEL BY RT-PCR (RSV, FLU A&B, COVID)  RVPGX2  CK  SEDIMENTATION RATE  C-REACTIVE PROTEIN  HIV ANTIBODY (ROUTINE TESTING W REFLEX)  HEMOGLOBIN A1C  CBC  CREATININE, SERUM  HEMOGLOBIN A1C  COMPREHENSIVE METABOLIC PANEL  CBC                                                                                                                          Radiology CT Renal Stone Study  Result Date: 12/12/2022 CLINICAL DATA:  Lower back pain for a week. Improving nausea vomiting. EXAM: CT ABDOMEN AND PELVIS WITHOUT CONTRAST TECHNIQUE: Multidetector CT imaging of the abdomen and pelvis was performed following the standard protocol without IV contrast. RADIATION DOSE REDUCTION: This exam was performed according to the departmental dose-optimization program which includes automated exposure control, adjustment of the mA and/or kV  according to patient size and/or use of iterative reconstruction technique. COMPARISON:  Contrast CT 12/08/2022 FINDINGS: Lower chest: Lung bases are clear. Trace pericardial fluid versus thickening. Hepatobiliary: On this non IV contrast exam, the liver is grossly preserved. The gallbladder is nondilated. Pancreas: Unremarkable. No pancreatic ductal dilatation or surrounding inflammatory changes. Spleen: Normal in size without focal abnormality. Adrenals/Urinary Tract: The adrenal glands are preserved. There is punctate nonobstructing lower pole left-sided renal stone. No right-sided renal stone. No ureteral stones. Preserved contours of the urinary bladder. Stomach/Bowel: Focal areas of fatty infiltration or lipomas along the sigmoid colon. The large bowel on this non oral contrast exam has a normal course and caliber with mild stool. Normal appendix extends medial to the cecum in the right hemipelvis. Small bowel is nondilated. Vascular/Lymphatic: Normal caliber aorta and IVC. No specific abnormal lymph node enlargement seen in the abdomen and pelvis. Reproductive: Uterus and ovaries are grossly preserved. There are presumed tubal ligation clips in the left midabdomen and pelvis. Please correlate with the history. Other: No free air or free fluid. Only trace stranding in the low pelvis, nonspecific and could be physiologic. Musculoskeletal: Chronic deformity of the left iliac crest. Minimal osteophytes along the lower thoracic spine. IMPRESSION: Punctate  nonobstructing left-sided renal stone.  No ureteral stone. No bowel obstruction, free air or free fluid.  Normal appendix. Electronically Signed   By: Karen Kays M.D.   On: 12/12/2022 18:32    Pertinent labs & imaging results that were available during my care of the patient were reviewed by me and considered in my medical decision making (see MDM for details).  Medications Ordered in ED Medications  lidocaine (LIDODERM) 5 % 1 patch (1 patch Transdermal  Patch Applied 12/12/22 2055)  insulin aspart (novoLOG) injection 0-9 Units (has no administration in time range)  heparin injection 5,000 Units (has no administration in time range)  lactated ringers 1,000 mL with potassium chloride 40 mEq infusion (has no administration in time range)  acetaminophen (TYLENOL) tablet 650 mg (has no administration in time range)    Or  acetaminophen (TYLENOL) suppository 650 mg (has no administration in time range)  ondansetron (ZOFRAN) tablet 4 mg (has no administration in time range)    Or  ondansetron (ZOFRAN) injection 4 mg (has no administration in time range)  prochlorperazine (COMPAZINE) injection 10 mg (10 mg Intravenous Given 12/12/22 1831)  diphenhydrAMINE (BENADRYL) injection 25 mg (25 mg Intravenous Given 12/12/22 1831)  lactated ringers bolus 1,000 mL (0 mLs Intravenous Stopped 12/12/22 2057)  ketorolac (TORADOL) 15 MG/ML injection 15 mg (15 mg Intravenous Given 12/12/22 1831)  potassium chloride SA (KLOR-CON M) CR tablet 40 mEq (40 mEq Oral Given 12/12/22 2055)  potassium chloride 10 mEq in 100 mL IVPB (0 mEq Intravenous Paused 12/12/22 2149)  magnesium oxide (MAG-OX) tablet 800 mg (800 mg Oral Given 12/12/22 2055)                                                                                                                                     Procedures .Critical Care  Performed by: Glendora Score, MD Authorized by: Glendora Score, MD   Critical care provider statement:    Critical care time (minutes):  30   Critical care was necessary to treat or prevent imminent or life-threatening deterioration of the following conditions:  Metabolic crisis   Critical care was time spent personally by me on the following activities:  Development of treatment plan with patient or surrogate, discussions with consultants, evaluation of patient's response to treatment, examination of patient, ordering and review of laboratory studies, ordering and review of  radiographic studies, ordering and performing treatments and interventions, pulse oximetry, re-evaluation of patient's condition and review of old charts   (including critical care time)  Medical Decision Making / ED Course   This patient presents to the ED for concern of back pain, headache, this involves an extensive number of treatment options, and is a complaint that carries with it a high risk of complications and morbidity.  The differential diagnosis includes nephrolithiasis, viral illness, meningitis, migraine disorder, electrolyte abnormality, rhabdomyolysis  MDM: Patient seen emergency room for evaluation of multiple complaints described  above.  Physical exam with tenderness in the paraspinal muscles of the L-spine but is otherwise unremarkable.  Neurologic exam is unremarkable with no focal motor or sensory deficits.  No meningismus on exam.  Laboratory evaluation is concerning with new significant hypokalemia to 2.6 and aggressive repletion was initiated with oral and IV potassium with oral magnesium.  Workup otherwise unremarkable.  COVID , flu, RSV negative.  Stone study performed due to abrupt onset flank pain with exacerbation of symptoms today which was reassuringly negative for obstructing stone.  Patient given headache cocktail with significant proving of her symptoms.  At this time, I have overall low suspicion for meningitis given no presenting meningismus and dramatic improvement with single headache cocktail.  We had a discussion about LP in the emergency department versus watch and wait and patient would like to defer this if possible.  Patient require hospital admission as this is her third ER presentation for the same illness and we are now starting to have electrolyte abnormality secondary to decreased p.o. intake.  Patient then admitted.   Additional history obtained: -Additional history obtained from partner -External records from outside source obtained and reviewed  including: Chart review including previous notes, labs, imaging, consultation notes   Lab Tests: -I ordered, reviewed, and interpreted labs.   The pertinent results include:   Labs Reviewed  CBC WITH DIFFERENTIAL/PLATELET - Abnormal; Notable for the following components:      Result Value   Abs Immature Granulocytes 0.12 (*)    All other components within normal limits  URINALYSIS, ROUTINE W REFLEX MICROSCOPIC - Abnormal; Notable for the following components:   Specific Gravity, Urine 1.002 (*)    Leukocytes,Ua TRACE (*)    All other components within normal limits  COMPREHENSIVE METABOLIC PANEL - Abnormal; Notable for the following components:   Potassium 2.6 (*)    Glucose, Bld 101 (*)    BUN <5 (*)    Calcium 7.9 (*)    Albumin 3.1 (*)    All other components within normal limits  SARS CORONAVIRUS 2 BY RT PCR  RESP PANEL BY RT-PCR (RSV, FLU A&B, COVID)  RVPGX2  CK  SEDIMENTATION RATE  C-REACTIVE PROTEIN  HIV ANTIBODY (ROUTINE TESTING W REFLEX)  HEMOGLOBIN A1C  CBC  CREATININE, SERUM  HEMOGLOBIN A1C  COMPREHENSIVE METABOLIC PANEL  CBC       Imaging Studies ordered: I ordered imaging studies including CT stone study I independently visualized and interpreted imaging. I agree with the radiologist interpretation   Medicines ordered and prescription drug management: Meds ordered this encounter  Medications   prochlorperazine (COMPAZINE) injection 10 mg   diphenhydrAMINE (BENADRYL) injection 25 mg   lactated ringers bolus 1,000 mL   ketorolac (TORADOL) 15 MG/ML injection 15 mg   lidocaine (LIDODERM) 5 % 1 patch   potassium chloride SA (KLOR-CON M) CR tablet 40 mEq   potassium chloride 10 mEq in 100 mL IVPB   magnesium oxide (MAG-OX) tablet 800 mg   insulin aspart (novoLOG) injection 0-9 Units    Order Specific Question:   Correction coverage:    Answer:   Sensitive (thin, NPO, renal)    Order Specific Question:   CBG < 70:    Answer:   Implement Hypoglycemia  Standing Orders and refer to Hypoglycemia Standing Orders sidebar report    Order Specific Question:   CBG 70 - 120:    Answer:   0 units    Order Specific Question:   CBG 121 - 150:  Answer:   1 unit    Order Specific Question:   CBG 151 - 200:    Answer:   2 units    Order Specific Question:   CBG 201 - 250:    Answer:   3 units    Order Specific Question:   CBG 251 - 300:    Answer:   5 units    Order Specific Question:   CBG 301 - 350:    Answer:   7 units    Order Specific Question:   CBG 351 - 400    Answer:   9 units    Order Specific Question:   CBG > 400    Answer:   call MD and obtain STAT lab verification   heparin injection 5,000 Units   lactated ringers 1,000 mL with potassium chloride 40 mEq infusion   OR Linked Order Group    acetaminophen (TYLENOL) tablet 650 mg    acetaminophen (TYLENOL) suppository 650 mg   OR Linked Order Group    ondansetron (ZOFRAN) tablet 4 mg    ondansetron (ZOFRAN) injection 4 mg    -I have reviewed the patients home medicines and have made adjustments as needed  Critical interventions Potassium repletion, magnesium    Cardiac Monitoring: The patient was maintained on a cardiac monitor.  I personally viewed and interpreted the cardiac monitored which showed an underlying rhythm of: NSR  Social Determinants of Health:  Factors impacting patients care include: none   Reevaluation: After the interventions noted above, I reevaluated the patient and found that they have :improved  Co morbidities that complicate the patient evaluation  Past Medical History:  Diagnosis Date   Diabetes mellitus without complication    Gestational diet control   GERD (gastroesophageal reflux disease)    Gestational diabetes    UTI (urinary tract infection)       Dispostion: I considered admission for this patient, and due to severe hypokalemia patient require hospital admission     Final Clinical Impression(s) / ED Diagnoses Final  diagnoses:  Hypokalemia     @    Glendora Score, MD 12/12/22 2257

## 2022-12-12 NOTE — ED Notes (Signed)
Pt disconnected herself from all monitor devices and stated she did not want them on because she was hot and was not comfortable enough to sleep with them attached. Informed Pt on importance of being monitored.

## 2022-12-13 ENCOUNTER — Inpatient Hospital Stay (HOSPITAL_COMMUNITY): Payer: Medicaid Other

## 2022-12-13 DIAGNOSIS — G4452 New daily persistent headache (NDPH): Secondary | ICD-10-CM

## 2022-12-13 DIAGNOSIS — E876 Hypokalemia: Secondary | ICD-10-CM | POA: Diagnosis not present

## 2022-12-13 LAB — CSF CELL COUNT WITH DIFFERENTIAL
Lymphs, CSF: 90 % — ABNORMAL HIGH (ref 40–80)
Lymphs, CSF: 94 % — ABNORMAL HIGH (ref 40–80)
Monocyte-Macrophage-Spinal Fluid: 4 % — ABNORMAL LOW (ref 15–45)
Monocyte-Macrophage-Spinal Fluid: 9 % — ABNORMAL LOW (ref 15–45)
RBC Count, CSF: 2 /mm3 — ABNORMAL HIGH
RBC Count, CSF: 2 /mm3 — ABNORMAL HIGH
Segmented Neutrophils-CSF: 1 % (ref 0–6)
Segmented Neutrophils-CSF: 2 % (ref 0–6)
Tube #: 1
Tube #: 4
WBC, CSF: 125 /mm3 (ref 0–5)
WBC, CSF: 70 /mm3 (ref 0–5)

## 2022-12-13 LAB — GLUCOSE, CAPILLARY
Glucose-Capillary: 118 mg/dL — ABNORMAL HIGH (ref 70–99)
Glucose-Capillary: 95 mg/dL (ref 70–99)
Glucose-Capillary: 96 mg/dL (ref 70–99)
Glucose-Capillary: 86 mg/dL (ref 70–99)

## 2022-12-13 LAB — LACTIC ACID, PLASMA
Lactic Acid, Venous: 1.1 mmol/L (ref 0.5–1.9)
Lactic Acid, Venous: 0.8 mmol/L (ref 0.5–1.9)

## 2022-12-13 LAB — MENINGITIS/ENCEPHALITIS PANEL (CSF)

## 2022-12-13 LAB — COMPREHENSIVE METABOLIC PANEL
ALT: 11 U/L (ref 0–44)
AST: 15 U/L (ref 15–41)
Albumin: 3 g/dL — ABNORMAL LOW (ref 3.5–5.0)
Alkaline Phosphatase: 46 U/L (ref 38–126)
Anion gap: 5 (ref 5–15)
BUN: 6 mg/dL (ref 6–20)
CO2: 27 mmol/L (ref 22–32)
Calcium: 7.5 mg/dL — ABNORMAL LOW (ref 8.9–10.3)
Chloride: 101 mmol/L (ref 98–111)
Creatinine, Ser: 0.72 mg/dL (ref 0.44–1.00)
GFR, Estimated: >60 mL/min (ref >60)
Glucose, Bld: 91 mg/dL (ref 70–99)
Potassium: 3.3 mmol/L — ABNORMAL LOW (ref 3.5–5.1)
Sodium: 133 mmol/L — ABNORMAL LOW (ref 135–145)
Total Bilirubin: 0.9 mg/dL (ref 0.3–1.2)
Total Protein: 6.5 g/dL (ref 6.5–8.1)

## 2022-12-13 LAB — PROTEIN AND GLUCOSE, CSF
Glucose, CSF: 49 mg/dL (ref 40–70)
Total  Protein, CSF: 48 mg/dL — ABNORMAL HIGH (ref 15–45)

## 2022-12-13 LAB — CREATININE, SERUM
Creatinine, Ser: 0.77 mg/dL (ref 0.44–1.00)
GFR, Estimated: >60 mL/min (ref >60)

## 2022-12-13 LAB — HIV ANTIBODY (ROUTINE TESTING W REFLEX): HIV Screen 4th Generation wRfx: NONREACTIVE

## 2022-12-13 LAB — CBC
HCT: 32.2 % — ABNORMAL LOW (ref 36.0–46.0)
Hemoglobin: 10.9 g/dL — ABNORMAL LOW (ref 12.0–15.0)
MCH: 31.9 pg (ref 26.0–34.0)
MCHC: 33.9 g/dL (ref 30.0–36.0)
MCV: 94.2 fL (ref 80.0–100.0)
Platelets: 342 10*3/uL (ref 150–400)
RBC: 3.42 MIL/uL — ABNORMAL LOW (ref 3.87–5.11)
RDW: 12 % (ref 11.5–15.5)
WBC: 5.7 10*3/uL (ref 4.0–10.5)
nRBC: 0 % (ref 0.0–0.2)

## 2022-12-13 LAB — C-REACTIVE PROTEIN: CRP: 2.2 mg/dL — ABNORMAL HIGH

## 2022-12-13 LAB — CSF CULTURE W GRAM STAIN

## 2022-12-13 LAB — HEMOGLOBIN A1C
Hgb A1c MFr Bld: 4.8 % (ref 4.8–5.6)
Hgb A1c MFr Bld: 5 % (ref 4.8–5.6)
Mean Plasma Glucose: 91.06 mg/dL
Mean Plasma Glucose: 96.8 mg/dL

## 2022-12-13 LAB — PROCALCITONIN: Procalcitonin: <0.1 ng/mL

## 2022-12-13 LAB — SEDIMENTATION RATE: Sed Rate: 56 mm/h — ABNORMAL HIGH (ref 0–22)

## 2022-12-13 MED ORDER — VANCOMYCIN HCL IN DEXTROSE 1-5 GM/200ML-% IV SOLN
1000.0000 mg | Freq: Once | INTRAVENOUS | Status: AC
  Administered 2022-12-13: 1000 mg via INTRAVENOUS
  Filled 2022-12-13: qty 200

## 2022-12-13 MED ORDER — POTASSIUM CHLORIDE CRYS ER 20 MEQ PO TBCR
40.0000 meq | EXTENDED_RELEASE_TABLET | Freq: Once | ORAL | Status: AC
  Administered 2022-12-13: 40 meq via ORAL
  Filled 2022-12-13: qty 2

## 2022-12-13 MED ORDER — SODIUM CHLORIDE 0.9 % IV SOLN
2.0000 g | Freq: Two times a day (BID) | INTRAVENOUS | Status: DC
  Administered 2022-12-14: 2 g via INTRAVENOUS
  Filled 2022-12-13: qty 20

## 2022-12-13 MED ORDER — LACTATED RINGERS IV SOLN: INTRAVENOUS | Status: DC

## 2022-12-13 MED ORDER — VANCOMYCIN HCL 1.5 G IV SOLR
1500.0000 mg | Freq: Two times a day (BID) | INTRAVENOUS | Status: DC
  Administered 2022-12-13: 1500 mg via INTRAVENOUS
  Filled 2022-12-13: qty 30

## 2022-12-13 MED ORDER — SODIUM CHLORIDE 0.9 % IV SOLN
2.0000 g | Freq: Once | INTRAVENOUS | Status: AC
  Administered 2022-12-13: 2 g via INTRAVENOUS
  Filled 2022-12-13: qty 20

## 2022-12-13 MED ORDER — LORAZEPAM 1 MG PO TABS: 1.0000 mg | ORAL_TABLET | Freq: Once | ORAL | Status: AC | PRN

## 2022-12-13 MED ORDER — LORAZEPAM 2 MG/ML IJ SOLN
0.5000 mg | Freq: Once | INTRAMUSCULAR | Status: AC
  Administered 2022-12-13: 0.5 mg via INTRAVENOUS
  Filled 2022-12-13: qty 1

## 2022-12-13 MED ORDER — SODIUM CHLORIDE 0.9 % IV SOLN: 2.0000 g | INTRAVENOUS | Status: DC

## 2022-12-13 MED ORDER — LIP MEDEX EX OINT
TOPICAL_OINTMENT | CUTANEOUS | Status: DC | PRN
  Filled 2022-12-13: qty 7

## 2022-12-13 MED ORDER — DEXTROSE 5 % IV SOLN
10.0000 mg/kg | Freq: Three times a day (TID) | INTRAVENOUS | Status: DC
  Administered 2022-12-13 – 2022-12-14 (×3): 700 mg via INTRAVENOUS
  Filled 2022-12-13 (×2): qty 14
  Filled 2022-12-13: qty 10
  Filled 2022-12-13: qty 14

## 2022-12-13 MED ORDER — TOPIRAMATE 25 MG PO TABS
25.0000 mg | ORAL_TABLET | Freq: Every day | ORAL | Status: AC
  Administered 2022-12-13: 25 mg via ORAL
  Filled 2022-12-13: qty 1

## 2022-12-13 NOTE — Progress Notes (Signed)
Patient noted sitting up on the bed, remind patient that her lying flat time post spinal tap is not up until 5pm, patient stated she was told 4hrs and that the 4hrs is done and that she does not have time to argue, She sat up higher in the bed. Spinal tap site clean/dry/intact.

## 2022-12-13 NOTE — Hospital Course (Signed)
Annette Juarez is a 27 y.o. female with a history of gestational diabetes and GERD. Patient presented secondary to recurrent headache with associated nausea and vomiting. Patient also found to have evidence of hypokalemia. IV fluids provided. Neurology consulted for headache and have recommended inpatient workup to rule out idiopathic intracranial hypertension versus aseptic meningitis.

## 2022-12-13 NOTE — Progress Notes (Signed)
Date and time results received: 12/13/22   (use smartphrase ".now" to insert current time)  Test:Spinal Fluid result- WBC present. Critical Value: SCF cell count: Tube #1- [id number], Tube #4 WBC-70  Name of Provider Notified: Dr. Caleb Popp and [facility] with neurology   Orders Received? Or Actions Taken?: Orders Received - See Orders for details and Actions Taken:  Droplet precaution and Antibiotics ordered

## 2022-12-13 NOTE — Progress Notes (Signed)
PROGRESS NOTE    TIFFANNIE SLOSS  ZOX:[id number] DOB: 1995-09-08 DOA: 12/12/2022 PCP: Patient, No Pcp Per   Brief Narrative: BRENT NOTO is a 27 y.o. female with a history of gestational diabetes and GERD. Patient presented secondary to recurrent headache with associated nausea and vomiting. Patient also found to have evidence of hypokalemia. IV fluids provided. Neurology consulted for headache and have recommended inpatient workup to rule out idiopathic intracranial hypertension versus aseptic meningitis.   Assessment and Plan:  Recurrent headache Concern for possible migraine. Neurology consulted with concern for possible idiopathic intracranial hypertension versus aseptic meningitis. Patient with some improvement this morning. MRI attempted this morning but only MRI brain without contrast was performed and was significant for no acute intracranial abnormality; limited by motion artifact. -Neurology recommendations: MRI brain w/wo contrast, LP  Hypokalemia Potassium of 2.6 on admission. Potassium supplementation given. Potassium of 3.3 this morning. -Continue potassium supplementation with potassium chloride  Fever Tmax of 100.4 F on admission. No recurrent fevers. Complicated by headache presentation. No antibiotics started on admission.  -Trend fever curve  Heart murmur No prior history. Possibly related to dehydration and hypovolemia. Possibly related to anemia as well. -Monitor while inpatient -Outpatient workup if persists  Non-obstructing left-sided renal stone Punctate in size. Located in lower pole. Asymptomatic.  Normocytic anemia Menstruating female. Possibly related iron deficiency, although no microcytic anemia noted. -Anemia panel  Gestational diabetes Patient is not on medication therapy as an outpatient. Last hemoglobin A1C of 5%  GERD -Continue Protonix  DVT prophylaxis: Heparin subq Code Status:   Code Status: Full Code Family Communication:  None at bedside Disposition Plan: Discharge home likely in 1-2 days pending continued neurology recommendations   Consultants:  Neurology  Procedures:  None  Antimicrobials: None    Subjective: Patient reports some improvement of headache from yesterday. No longer vomiting. Went down for MRI but her IV blew and she was unable to complete the contrast portion of her MRI. She reports no known history of a heart murmur.  Objective: BP 134/88 (BP Location: Right Arm)   Pulse 82   Temp 98.8 F (37.1 C) (Oral)   Resp 17   Ht 5\' 3"  (1.6 m)   Wt 96.4 kg   SpO2 100%   BMI 37.65 kg/m   Examination:  General exam: Appears calm and comfortable Respiratory system: Clear to auscultation. Respiratory effort normal. Cardiovascular system: S1 & S2 heard, RRR. 2/6 systolic murmur. Gastrointestinal system: Abdomen is nondistended, soft and nontender. No organomegaly or masses felt. Normal bowel sounds heard. Central nervous system: Alert and oriented. No focal neurological deficits. Musculoskeletal: No edema. No calf tenderness Skin: No cyanosis. No rashes Psychiatry: Judgement and insight appear normal. Mood & affect appropriate.    Data Reviewed: I have personally reviewed following labs and imaging studies  CBC Lab Results  Component Value Date   WBC 5.7 12/13/2022   RBC 3.42 (L) 12/13/2022   HGB 10.9 (L) 12/13/2022   HCT 32.2 (L) 12/13/2022   MCV 94.2 12/13/2022   MCH 31.9 12/13/2022   PLT 342 12/13/2022   MCHC 33.9 12/13/2022   RDW 12.0 12/13/2022   LYMPHSABS 2.6 12/12/2022   MONOABS 0.8 12/12/2022   EOSABS 0.0 12/12/2022   BASOSABS 0.0 12/12/2022     Last metabolic panel Lab Results  Component Value Date   NA 133 (L) 12/13/2022   K 3.3 (L) 12/13/2022   CL 101 12/13/2022   CO2 27 12/13/2022   BUN 6 12/13/2022  CREATININE 0.72 12/13/2022   GLUCOSE 91 12/13/2022   GFRNONAA >60 12/13/2022   GFRAA >60 07/22/2018   CALCIUM 7.5 (L) 12/13/2022   PROT 6.5  12/13/2022   ALBUMIN 3.0 (L) 12/13/2022   BILITOT 0.9 12/13/2022   ALKPHOS 46 12/13/2022   AST 15 12/13/2022   ALT 11 12/13/2022   ANIONGAP 5 12/13/2022    GFR: Estimated Creatinine Clearance: 116.7 mL/min (by C-G formula based on SCr of 0.72 mg/dL).  Recent Results (from the past 240 hour(s))  Resp panel by RT-PCR (RSV, Flu A&B, Covid) Anterior Nasal Swab     Status: None   Collection Time: 12/08/22  4:20 PM   Specimen: Anterior Nasal Swab  Result Value Ref Range Status   SARS Coronavirus 2 by RT PCR NEGATIVE NEGATIVE Final   Influenza A by PCR NEGATIVE NEGATIVE Final   Influenza B by PCR NEGATIVE NEGATIVE Final    Comment: (NOTE) The Xpert Xpress SARS-CoV-2/FLU/RSV plus assay is intended as an aid in the diagnosis of influenza from Nasopharyngeal swab specimens and should not be used as a sole basis for treatment. Nasal washings and aspirates are unacceptable for Xpert Xpress SARS-CoV-2/FLU/RSV testing.  Fact Sheet for Patients: BloggerCourse.com  Fact Sheet for Healthcare Providers: SeriousBroker.it  This test is not yet approved or cleared by the [place] FDA and has been authorized for detection and/or diagnosis of SARS-CoV-2 by FDA under an Emergency Use Authorization (EUA). This EUA will remain in effect (meaning this test can be used) for the duration of the COVID-19 declaration under Section 564(b)(1) of the Act, 21 U.S.C. section 360bbb-3(b)(1), unless the authorization is terminated or revoked.     Resp Syncytial Virus by PCR NEGATIVE NEGATIVE Final    Comment: (NOTE) Fact Sheet for Patients: BloggerCourse.com  Fact Sheet for Healthcare Providers: SeriousBroker.it  This test is not yet approved or cleared by the [place] FDA and has been authorized for detection and/or diagnosis of SARS-CoV-2 by FDA under an Emergency Use Authorization (EUA). This  EUA will remain in effect (meaning this test can be used) for the duration of the COVID-19 declaration under Section 564(b)(1) of the Act, 21 U.S.C. section 360bbb-3(b)(1), unless the authorization is terminated or revoked.  Performed at [facility], [address], [place], [place] [address]   SARS Coronavirus 2 by RT PCR (hospital order, performed in [facility] hospital lab) *cepheid single result test*     Status: None   Collection Time: 12/12/22  5:29 PM  Result Value Ref Range Status   SARS Coronavirus 2 by RT PCR NEGATIVE NEGATIVE Final    Comment: (NOTE) SARS-CoV-2 target nucleic acids are NOT DETECTED.  The SARS-CoV-2 RNA is generally detectable in upper and lower respiratory specimens during the acute phase of infection. The lowest concentration of SARS-CoV-2 viral copies this assay can detect is 250 copies / mL. A negative result does not preclude SARS-CoV-2 infection and should not be used as the sole basis for treatment or other patient management decisions.  A negative result may occur with improper specimen collection / handling, submission of specimen other than nasopharyngeal swab, presence of viral mutation(s) within the areas targeted by this assay, and inadequate number of viral copies (<250 copies / mL). A negative result must be combined with clinical observations, patient history, and epidemiological information.  Fact Sheet for Patients:   RoadLapTop.co.za  Fact Sheet for Healthcare Providers: http://kim-miller.com/  This test is not yet approved or  cleared by the [place] FDA  and has been authorized for detection and/or diagnosis of SARS-CoV-2 by FDA under an Emergency Use Authorization (EUA).  This EUA will remain in effect (meaning this test can be used) for the duration of the COVID-19 declaration under Section 564(b)(1) of the Act, 21 U.S.C. section 360bbb-3(b)(1), unless the authorization is  terminated or revoked sooner.  Performed at [facility], [address] [place], [place] [address]   Resp panel by RT-PCR (RSV, Flu A&B, Covid)     Status: None   Collection Time: 12/12/22  5:29 PM  Result Value Ref Range Status   SARS Coronavirus 2 by RT PCR NEGATIVE NEGATIVE Final   Influenza A by PCR NEGATIVE NEGATIVE Final   Influenza B by PCR NEGATIVE NEGATIVE Final    Comment: (NOTE) The Xpert Xpress SARS-CoV-2/FLU/RSV plus assay is intended as an aid in the diagnosis of influenza from Nasopharyngeal swab specimens and should not be used as a sole basis for treatment. Nasal washings and aspirates are unacceptable for Xpert Xpress SARS-CoV-2/FLU/RSV testing.  Fact Sheet for Patients: BloggerCourse.com  Fact Sheet for Healthcare Providers: SeriousBroker.it  This test is not yet approved or cleared by the [place] FDA and has been authorized for detection and/or diagnosis of SARS-CoV-2 by FDA under an Emergency Use Authorization (EUA). This EUA will remain in effect (meaning this test can be used) for the duration of the COVID-19 declaration under Section 564(b)(1) of the Act, 21 U.S.C. section 360bbb-3(b)(1), unless the authorization is terminated or revoked.     Resp Syncytial Virus by PCR NEGATIVE NEGATIVE Final    Comment: (NOTE) Fact Sheet for Patients: BloggerCourse.com  Fact Sheet for Healthcare Providers: SeriousBroker.it  This test is not yet approved or cleared by the [place] FDA and has been authorized for detection and/or diagnosis of SARS-CoV-2 by FDA under an Emergency Use Authorization (EUA). This EUA will remain in effect (meaning this test can be used) for the duration of the COVID-19 declaration under Section 564(b)(1) of the Act, 21 U.S.C. section 360bbb-3(b)(1), unless the authorization is terminated  or revoked.  Performed at [facility], [address] [place], [place] [address]       Radiology Studies: MR BRAIN WO CONTRAST  Result Date: 12/13/2022 CLINICAL DATA:  Headache, neuro deficit. New onset daily persistent headache for 1 week. EXAM: MRI HEAD WITHOUT CONTRAST TECHNIQUE: Multiplanar, multiecho pulse sequences of the brain and surrounding structures were obtained without intravenous contrast. COMPARISON:  Head CT/CTA/CTV 12/10/2022. FINDINGS: Brain: Motion degraded study. Within this limitation, no acute infarct or hemorrhage. No mass or midline shift. No hydrocephalus or extra-axial collection. No abnormal susceptibility. Vascular: Normal flow voids. Skull and upper cervical spine: Normal marrow signal. Sinuses/Orbits: Unremarkable. Other: None. IMPRESSION: Motion degraded study. Within this limitation, no acute intracranial abnormality or mass. Electronically Signed   By: Orvan Falconer M.D.   On: 12/13/2022 08:54   CT Renal Stone Study  Result Date: 12/12/2022 CLINICAL DATA:  Lower back pain for a week. Improving nausea vomiting. EXAM: CT ABDOMEN AND PELVIS WITHOUT CONTRAST TECHNIQUE: Multidetector CT imaging of the abdomen and pelvis was performed following the standard protocol without IV contrast. RADIATION DOSE REDUCTION: This exam was performed according to the departmental dose-optimization program which includes automated exposure control, adjustment of the mA and/or kV according to patient size and/or use of iterative reconstruction technique. COMPARISON:  Contrast CT 12/08/2022 FINDINGS: Lower chest: Lung bases are clear. Trace pericardial fluid versus thickening. Hepatobiliary: On this non IV contrast exam,  the liver is grossly preserved. The gallbladder is nondilated. Pancreas: Unremarkable. No pancreatic ductal dilatation or surrounding inflammatory changes. Spleen: Normal in size without focal abnormality. Adrenals/Urinary Tract: The adrenal glands are preserved.  There is punctate nonobstructing lower pole left-sided renal stone. No right-sided renal stone. No ureteral stones. Preserved contours of the urinary bladder. Stomach/Bowel: Focal areas of fatty infiltration or lipomas along the sigmoid colon. The large bowel on this non oral contrast exam has a normal course and caliber with mild stool. Normal appendix extends medial to the cecum in the right hemipelvis. Small bowel is nondilated. Vascular/Lymphatic: Normal caliber aorta and IVC. No specific abnormal lymph node enlargement seen in the abdomen and pelvis. Reproductive: Uterus and ovaries are grossly preserved. There are presumed tubal ligation clips in the left midabdomen and pelvis. Please correlate with the history. Other: No free air or free fluid. Only trace stranding in the low pelvis, nonspecific and could be physiologic. Musculoskeletal: Chronic deformity of the left iliac crest. Minimal osteophytes along the lower thoracic spine. IMPRESSION: Punctate nonobstructing left-sided renal stone.  No ureteral stone. No bowel obstruction, free air or free fluid.  Normal appendix. Electronically Signed   By: Karen Kays M.D.   On: 12/12/2022 18:32      LOS: 1 day    Jacquelin Hawking, MD [facility] 12/13/2022, 10:03 AM   If 7PM-7AM, please contact night-coverage [email or web address]

## 2022-12-13 NOTE — TOC Progression Note (Signed)
Transition of Care [facility]) - Progression Note    Patient Details  Name: Annette Juarez MRN: [id number] Date of Birth: 1996/03/19  Transition of Care [facility]) CM/SW Contact  Geni Bers, RN Phone Number: 12/13/2022, 9:17 AM  Clinical Narrative:     Transition of Care (TOC) Screening Note   Patient Details  Name: Annette Juarez Date of Birth: 10-Jul-1996   Transition of Care [facility]) CM/SW Contact:    Geni Bers, RN Phone Number: 12/13/2022, 9:17 AM    Transition of Care Department (TOC) has reviewed patient and no TOC needs have been identified at this time. We will continue to monitor patient advancement through interdisciplinary progression rounds. If new patient transition needs arise, please place a TOC consult.          Expected Discharge Plan and Services                                               Social Determinants of Health (SDOH) Interventions SDOH Screenings   Food Insecurity: No Food Insecurity (12/12/2022)  Housing: Low Risk  (12/12/2022)  Transportation Needs: No Transportation Needs (12/12/2022)  Utilities: Not At Risk (12/12/2022)  Financial Resource Strain: Low Risk  (04/10/2018)  Stress: No Stress Concern Present (04/10/2018)  Tobacco Use: Low Risk  (12/12/2022)    Readmission Risk Interventions     No data to display

## 2022-12-13 NOTE — Consult Note (Addendum)
Neurology Consultation  Reason for Consult: Persistent headaches Referring Physician: Dr. Maurine Minister  CC: Headache  History is obtained from: Patient, chart  HPI: Annette Juarez is a 27 y.o. female past medical history of gestational diabetes, obesity, presenting to the emergency room for the third time in the last 1 week With complaints of headache that started 1 week ago, Monday, December 05, 2022 been bothering her.  She has been to the ER multiple times and was in manage with CT head, CT angio head and neck, CT venogram, all of which has been unremarkable. She describes a headache that involves her whole head, pounding in nature, with photophobia and phonophobia, with some worsening when she tries up and gets do her daily chores and also some positional worsening with bending forward.  Headache is somewhat relieved by sitting in the dark and over-the-counter NSAIDs that she has taken sometimes multiple times in a day in the past 1 week.  The medications given in the ER-migraine cocktails, did tend to lower the intensity of headache but the headache has not gone away now for more than a week. Neurology was consulted for further evaluation and recommendations. Patient denies any recent head or neck or back injury.  She denies any tinnitus.  Denies any visual obscurations.  Denies any recent psychological stressors.  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  She has 2 kids 37 and 35 years old.  She reports that her daughter has been sick with a sinus infection recently. In the emergency room, patient had a Tmax of 100.4 Fahrenheit. Discussion of LP was entertained with the ED provider but she deferred it for later.  ROS: Full ROS was performed and is negative except as noted in the HPI.   Past Medical History:  Diagnosis Date   Diabetes mellitus without complication    Gestational diet control   GERD (gastroesophageal reflux disease)    Gestational diabetes    UTI (urinary tract infection)     Family History  Problem Relation Age of Onset   Hypertension Mother    Diabetes Father    Asthma Sister    Diabetes Maternal Grandmother    Hypertension Maternal Grandmother    Hypertension Maternal Grandfather    Social History:   reports that she has never smoked. She has never used smokeless tobacco. She reports that she does not drink alcohol and does not use drugs.  Medications  Current Facility-Administered Medications:    acetaminophen (TYLENOL) tablet 650 mg, 650 mg, Oral, Q6H PRN **OR** acetaminophen (TYLENOL) suppository 650 mg, 650 mg, Rectal, Q6H PRN, Skip Mayer A, MD   diphenhydrAMINE (BENADRYL) injection 25 mg, 25 mg, Intravenous, Q6H PRN, Skip Mayer A, MD, 25 mg at 12/12/22 2327   heparin injection 5,000 Units, 5,000 Units, Subcutaneous, Q8H, Skip Mayer A, MD, 5,000 Units at 12/12/22 2327   ketorolac (TORADOL) 30 MG/ML injection 30 mg, 30 mg, Intravenous, Q6H PRN, Skip Mayer A, MD, 30 mg at 12/12/22 2327   lactated ringers 1,000 mL with potassium chloride 40 mEq infusion, , Intravenous, Continuous, Skip Mayer A, MD, Last Rate: 100 mL/hr at 12/13/22 0033, New Bag at 12/13/22 0033   lidocaine (LIDODERM) 5 % 1 patch, 1 patch, Transdermal, Q24H, Kommor, [place], MD, 1 patch at 12/12/22 2055   ondansetron (ZOFRAN) tablet 4 mg, 4 mg, Oral, Q6H PRN **OR** ondansetron (ZOFRAN) injection 4 mg, 4 mg, Intravenous, Q6H PRN, Lurline Del, MD   prochlorperazine (COMPAZINE) injection 10 mg, 10 mg, Intravenous,  Q4H PRN, Lurline Del, MD, 10 mg at 12/12/22 2327   Exam: Current vital signs: BP (!) 141/84 (BP Location: Left Arm)   Pulse 90   Temp 98.8 F (37.1 C) (Oral)   Resp 18   Ht 5\' 3"  (1.6 m)   Wt 95.7 kg   SpO2 100%   BMI 37.38 kg/m  Vital signs in last 24 hours: Temp:  [98.8 F (37.1 C)-100.4 F (38 C)] 98.8 F (37.1 C) (04/15 2258) Pulse Rate:  [81-110] 90 (04/15 2258) Resp:  [18-27] 18 (04/15 2258) BP:  (134-149)/(81-87) 141/84 (04/15 2258) SpO2:  [98 %-100 %] 100 % (04/15 2258) Weight:  [95.7 kg] 95.7 kg (04/15 1735) General: Awake alert in no distress HEENT: Normocephalic, atraumatic Lungs: Clear Cardiovascular: Regular rhythm Abdomen nondistended nontender Extremities warm well-perfused Neurological exam Awake alert oriented x 3. No dysarthria No aphasia Cranial nerves II to XII: Pupils equal round react light, extraocular movements intact, visual fields full, facial sensation intact, face symmetric, auditory acuity intact, tongue and palate midline.  Unable to perform funduscopy at this time. Motor examination with no drift Sensation intact light touch without extinction Coordination exam reveals no dysmetria Gait testing deferred NIH stroke scale 0  Labs I have reviewed labs in epic and the results pertinent to this consultation are: CBC    Component Value Date/Time   WBC 7.0 12/12/2022 2320   RBC 3.54 (L) 12/12/2022 2320   HGB 11.0 (L) 12/12/2022 2320   HGB 9.9 (L) 05/07/2018 1135   HCT 32.8 (L) 12/12/2022 2320   HCT 29.0 (L) 05/07/2018 1135   PLT 388 12/12/2022 2320   PLT 344 05/07/2018 1135   MCV 92.7 12/12/2022 2320   MCV 95 05/07/2018 1135   MCH 31.1 12/12/2022 2320   MCHC 33.5 12/12/2022 2320   RDW 11.9 12/12/2022 2320   RDW 12.1 (L) 05/07/2018 1135   LYMPHSABS 2.6 12/12/2022 1754   LYMPHSABS 2.2 04/10/2018 1118   MONOABS 0.8 12/12/2022 1754   EOSABS 0.0 12/12/2022 1754   EOSABS 0.2 04/10/2018 1118   BASOSABS 0.0 12/12/2022 1754   BASOSABS 0.0 04/10/2018 1118    CMP     Component Value Date/Time   NA 137 12/12/2022 1933   K 2.6 (LL) 12/12/2022 1933   CL 101 12/12/2022 1933   CO2 28 12/12/2022 1933   GLUCOSE 101 (H) 12/12/2022 1933   BUN <5 (L) 12/12/2022 1933   CREATININE 0.77 12/12/2022 2320   CALCIUM 7.9 (L) 12/12/2022 1933   PROT 7.0 12/12/2022 1933   ALBUMIN 3.1 (L) 12/12/2022 1933   AST 15 12/12/2022 1933   ALT 13 12/12/2022 1933    ALKPHOS 45 12/12/2022 1933   BILITOT 0.9 12/12/2022 1933   GFRNONAA >60 12/12/2022 2320   GFRAA >60 07/22/2018 2112   Imaging I have reviewed the images obtained:  CT-head 12/10/2022: No acute changes CT angiography head and neck: NA ICP CT venogram: No evidence of dural venous sinus thrombus   Assessment: 27 year old with 1 week worth of unremitting headaches which are new in onset.  CT head, CT angiography head and neck and CT venogram do not reveal any acute abnormality aneurysm or dural venous sinus thrombus.  The description of her headache with pounding type headache that gets worsened with bending raises concern for increased intracranial pressure, also given her body habitus with a BMI of 37.3. She also reports that she has young children, one of whom has been sick with a respiratory infection.  She does not  have any neck stiffness or meningismus but a viral CNS infection could still be in the realm of possibility. An LP should be strongly considered if she continues to have headaches by the morning.  Impression: New onset daily persistent headache-evaluate for idiopathic intracranial hypertension versus aseptic meningitis Evaluate for underlying structural brain abnormality  Recommendations: Migraine cocktail has been ordered by the primary team.  I am okay with continuing this for tonight. LP under fluoroscopy after MRI. Would recommend checking opening pressure, and send CSF for glucose, protein, cell count-tube 1 and tube 4, gram stain and c/s, as well as meningitis/encephalitis panel. MR brain with and without contrast (before LP) If the opening pressure is elevated, she will need treatment with acetazolamide.  If she has normal opening pressure and any evidence of aseptic meningitis, treatment would be largely symptomatic. If there is no evidence of CNS infection, at that point, she would need some preventive treatment for migraines-I would recommend starting on  Topamax. Please call on-call neurology team once MRI and LP results are available for further recs. Plan was relayed to the on-call floor coverage APP for [facility]. -- Milon Dikes, MD Neurologist [facility] Pager: [phone]

## 2022-12-13 NOTE — Progress Notes (Signed)
Attempted IV insertion to Right forearm. Pt held still, but c/o pain immediately upon insertion and requested for me to stop. Informed primary RN. IV team has been consulted.

## 2022-12-13 NOTE — Plan of Care (Signed)
  Problem: Clinical Measurements: Goal: Ability to maintain clinical measurements within normal limits will improve Outcome: Progressing   Problem: Clinical Measurements: Goal: Will remain free from infection Outcome: Progressing   Problem: Activity: Goal: Risk for activity intolerance will decrease Outcome: Progressing   Problem: Pain Managment: Goal: General experience of comfort will improve Outcome: Progressing   

## 2022-12-13 NOTE — Progress Notes (Signed)
Pharmacy Antibiotic Note  Annette Juarez is a 27 y.o. female admitted on 12/12/2022 with possible meningitis. Pharmacy has been consulted for vancomycin & acyclovir dosing.  Plan: Vancomycin 1500 mg IV q12 (est Tr 17.8; AUC 625) SCr daily while on vanc & acyclovir Acyclovir 700 mg IV q8 hr (10 mg/kg) Hydration per protocol with LR at 125 ml/hr Rocephin 2g IV q12 hr per MD   Height:  (160 cm) Weight: 96.4 kg (212 lb 8.4 oz) IBW/kg (Calculated) : 52.4  Temp (24hrs), Avg:99 F (37.2 C), Min:98.2 F (36.8 C), Max:100.3 F (37.9 C)  Recent Labs  Lab 12/08/22 2220 12/10/22 0000 12/12/22 1754 12/12/22 1933 12/12/22 2320 12/13/22 0248 12/13/22 0524  WBC 5.9 6.5 6.8  --  7.0  --  5.7  CREATININE 0.65 0.74  --  0.67 0.77  --  0.72  LATICACIDVEN  --   --   --   --   --  1.1 0.8    Estimated Creatinine Clearance: 116.7 mL/min (by C-G formula based on SCr of 0.72 mg/dL).    No Known Allergies  Antimicrobials this admission: 4/16 Rocephin 2q12 >>  4/16 vancomycin >>  4/16 acyclovir >>   Dose adjustments this admission: N/a  Microbiology results: 4/16 CSF: notable for elevated WBC & Lymphocytes - Gm stain: no organisms - Cx: pending 4/16 Meningitis Rapid ID panel: none detected   Thank you for allowing pharmacy to be a part of this patient's care.  Esthela Brandner A 12/13/2022 8:40 PM

## 2022-12-14 DIAGNOSIS — E876 Hypokalemia: Secondary | ICD-10-CM | POA: Diagnosis not present

## 2022-12-14 LAB — COMPREHENSIVE METABOLIC PANEL
ALT: 12 U/L (ref 0–44)
AST: 16 U/L (ref 15–41)
Albumin: 3.1 g/dL — ABNORMAL LOW (ref 3.5–5.0)
Alkaline Phosphatase: 43 U/L (ref 38–126)
Anion gap: 7 (ref 5–15)
BUN: <5 mg/dL — ABNORMAL LOW (ref 6–20)
CO2: 25 mmol/L (ref 22–32)
Calcium: 8.1 mg/dL — ABNORMAL LOW (ref 8.9–10.3)
Chloride: 104 mmol/L (ref 98–111)
Creatinine, Ser: 0.64 mg/dL (ref 0.44–1.00)
GFR, Estimated: >60 mL/min (ref >60)
Glucose, Bld: 84 mg/dL (ref 70–99)
Potassium: 4 mmol/L (ref 3.5–5.1)
Sodium: 136 mmol/L (ref 135–145)
Total Bilirubin: 0.7 mg/dL (ref 0.3–1.2)
Total Protein: 6.9 g/dL (ref 6.5–8.1)

## 2022-12-14 LAB — VITAMIN B12: Vitamin B-12: 220 pg/mL (ref 180–914)

## 2022-12-14 LAB — GLUCOSE, CAPILLARY
Glucose-Capillary: 80 mg/dL (ref 70–99)
Glucose-Capillary: 101 mg/dL — ABNORMAL HIGH (ref 70–99)
Glucose-Capillary: 89 mg/dL (ref 70–99)

## 2022-12-14 LAB — RETICULOCYTES
Immature Retic Fract: 21.2 % — ABNORMAL HIGH (ref 2.3–15.9)
RBC.: 3.39 MIL/uL — ABNORMAL LOW (ref 3.87–5.11)
Retic Count, Absolute: 87.1 10*3/uL (ref 19.0–186.0)
Retic Ct Pct: 2.6 % (ref 0.4–3.1)

## 2022-12-14 LAB — CRYPTOCOCCAL ANTIGEN, CSF: Crypto Ag: NEGATIVE

## 2022-12-14 LAB — PATHOLOGIST SMEAR REVIEW

## 2022-12-14 LAB — IRON AND TIBC
Iron: 43 ug/dL (ref 28–170)
Saturation Ratios: 21 % (ref 10.4–31.8)
TIBC: 202 ug/dL — ABNORMAL LOW (ref 250–450)
UIBC: 159 ug/dL

## 2022-12-14 LAB — CBC
HCT: 33.1 % — ABNORMAL LOW (ref 36.0–46.0)
Hemoglobin: 10.8 g/dL — ABNORMAL LOW (ref 12.0–15.0)
MCH: 31.2 pg (ref 26.0–34.0)
MCHC: 32.6 g/dL (ref 30.0–36.0)
MCV: 95.7 fL (ref 80.0–100.0)
Platelets: 332 10*3/uL (ref 150–400)
RBC: 3.46 MIL/uL — ABNORMAL LOW (ref 3.87–5.11)
RDW: 12 % (ref 11.5–15.5)
WBC: 5.5 10*3/uL (ref 4.0–10.5)
nRBC: 0 % (ref 0.0–0.2)

## 2022-12-14 LAB — FOLATE: Folate: 9.3 ng/mL (ref >5.9)

## 2022-12-14 LAB — FERRITIN: Ferritin: 141 ng/mL (ref 11–307)

## 2022-12-14 MED ORDER — TRAZODONE HCL 50 MG PO TABS
25.0000 mg | ORAL_TABLET | Freq: Once | ORAL | Status: AC
  Administered 2022-12-14: 25 mg via ORAL
  Filled 2022-12-14: qty 1

## 2022-12-14 MED ORDER — ONDANSETRON HCL 4 MG PO TABS: 4.0000 mg | ORAL_TABLET | Freq: Four times a day (QID) | ORAL | 0 refills | Status: DC | PRN

## 2022-12-14 MED ORDER — TOPIRAMATE 25 MG PO TABS
25.0000 mg | ORAL_TABLET | Freq: Every day | ORAL | Status: DC
  Administered 2022-12-14: 25 mg via ORAL
  Filled 2022-12-14: qty 1

## 2022-12-14 MED ORDER — VANCOMYCIN HCL 1500 MG/300ML IV SOLN
1500.0000 mg | Freq: Two times a day (BID) | INTRAVENOUS | Status: DC
  Filled 2022-12-14: qty 300

## 2022-12-14 MED ORDER — TOPIRAMATE 25 MG PO TABS: 25.0000 mg | ORAL_TABLET | Freq: Every day | ORAL | 0 refills | Status: DC

## 2022-12-14 MED ORDER — VANCOMYCIN HCL 10 G IV SOLR: 1500.0000 mg | Freq: Two times a day (BID) | INTRAVENOUS | Status: DC

## 2022-12-14 NOTE — Plan of Care (Signed)
  Problem: Clinical Measurements: Goal: Ability to maintain clinical measurements within normal limits will improve Outcome: Progressing Goal: Diagnostic test results will improve Outcome: Progressing   

## 2022-12-14 NOTE — Discharge Summary (Signed)
Physician Discharge Summary  Annette Juarez ZOX:[id number] DOB: 1995-10-27 DOA: 12/12/2022  PCP: Patient, No Pcp Per  Admit date: 12/12/2022 Discharge date: 12/14/2022  Admitted From: Home Disposition:  Home  Recommendations for Outpatient Follow-up:  Follow up with PCP in 1-2 weeks Follow up with neurology as scheduled  Home Health:None  Equipment/Devices:None  Discharge Condition:Stable  CODE STATUS:Full  Diet recommendation: Low salt low fat diet    Brief/Interim Summary: Annette Juarez is a 27 y.o. female with a history of gestational diabetes and GERD. Patient presented secondary to recurrent headache with associated nausea and vomiting. Patient also found to have evidence of hypokalemia. IV fluids provided. Neurology consulted for headache and have recommended inpatient workup to rule out idiopathic intracranial hypertension versus aseptic meningitis.  Patient's LP was remarkable for elevated pressure and increased WBCs but cultures/testing returned negative for infection - as such antibiotics/antivirals were discontinued and patient was discharged on topamax per Neurology recommendations. Close follow up with PCP and neurology as scheduled, otherwise patient is stable for discharge home.  Discharge Diagnoses:  Principal Problem:   Hypokalemia    Discharge Instructions   Allergies as of 12/14/2022   No Known Allergies      Medication List     STOP taking these medications    famotidine 20 MG tablet Commonly known as: PEPCID   ibuprofen 800 MG tablet Commonly known as: ADVIL   methocarbamol 500 MG tablet Commonly known as: ROBAXIN   naproxen 500 MG tablet Commonly known as: Naprosyn   promethazine 25 MG tablet Commonly known as: PHENERGAN   Vitafol-Nano 18-0.6-0.4 MG Tabs       TAKE these medications    acetaminophen-codeine 300-30 MG tablet Commonly known as: TYLENOL #3 Take 1 tablet by mouth 3 (three) times daily as needed for severe pain or  moderate pain.   meclizine 12.5 MG tablet Commonly known as: ANTIVERT Take 1 tablet (12.5 mg total) by mouth 3 (three) times daily as needed for dizziness.   ondansetron 4 MG tablet Commonly known as: ZOFRAN Take 1 tablet (4 mg total) by mouth every 6 (six) hours as needed for nausea. What changed:  when to take this reasons to take this   potassium chloride 10 MEQ tablet Commonly known as: KLOR-CON Take 1 tablet (10 mEq total) by mouth daily for 5 days.   topiramate 25 MG tablet Commonly known as: TOPAMAX Take 1 tablet (25 mg total) by mouth daily.   traMADol 50 MG tablet Commonly known as: ULTRAM Take 50 mg by mouth 3 (three) times daily as needed for moderate pain or severe pain.   Vitamin D (Ergocalciferol) 1.25 MG (50000 UNIT) Caps capsule Commonly known as: DRISDOL Take 50,000 Units by mouth once a week.        No Known Allergies  Consultations: Neurology, IR   Procedures/Studies: DG FLUORO GUIDED LOC OF NEEDLE/CATH TIP FOR SPINAL INJECT LT  Result Date: 12/13/2022 CLINICAL DATA:  [id number] Headache [id number] EXAM: DIAGNOSTIC LUMBAR PUNCTURE UNDER FLUOROSCOPIC GUIDANCE COMPARISON:  None Available. FLUOROSCOPY: Radiation Exposure Index (as provided by the fluoroscopic device): 1.6 mGy Kerma PROCEDURE: Informed consent was obtained from the patient prior to the procedure, including potential complications of headache, allergy, and pain. With the patient prone, the lower back was prepped with Betadine. 1% Lidocaine was used for local anesthesia. Lumbar puncture was performed at the L4-L5 level using a 22 gauge needle with return of clear CSF with an opening pressure of 34 cm water. Approximately 19 ml of  CSF were obtained for laboratory studies. Closing pressure was measured at 20 cm water. The patient experienced some pain during the procedure but overall tolerated well. There were no apparent complications. IMPRESSION: Successful lumbar puncture at the L4-L5 level. Opening  pressure was elevated, measuring 34 cm water, which can be seen in idiopathic intracranial hypertension. Approximately 19 mL of clear CSF was obtained for laboratory studies. Closing pressure was measured at 20 cm water. The patient reported a degree of headache relief after CSF collection. No immediate complications. Electronically Signed   By: Caprice Renshaw M.D.   On: 12/13/2022 14:21   MR BRAIN WO CONTRAST  Result Date: 12/13/2022 CLINICAL DATA:  Headache, neuro deficit. New onset daily persistent headache for 1 week. EXAM: MRI HEAD WITHOUT CONTRAST TECHNIQUE: Multiplanar, multiecho pulse sequences of the brain and surrounding structures were obtained without intravenous contrast. COMPARISON:  Head CT/CTA/CTV 12/10/2022. FINDINGS: Brain: Motion degraded study. Within this limitation, no acute infarct or hemorrhage. No mass or midline shift. No hydrocephalus or extra-axial collection. No abnormal susceptibility. Vascular: Normal flow voids. Skull and upper cervical spine: Normal marrow signal. Sinuses/Orbits: Unremarkable. Other: None. IMPRESSION: Motion degraded study. Within this limitation, no acute intracranial abnormality or mass. Electronically Signed   By: Orvan Falconer M.D.   On: 12/13/2022 08:54   CT Renal Stone Study  Result Date: 12/12/2022 CLINICAL DATA:  Lower back pain for a week. Improving nausea vomiting. EXAM: CT ABDOMEN AND PELVIS WITHOUT CONTRAST TECHNIQUE: Multidetector CT imaging of the abdomen and pelvis was performed following the standard protocol without IV contrast. RADIATION DOSE REDUCTION: This exam was performed according to the departmental dose-optimization program which includes automated exposure control, adjustment of the mA and/or kV according to patient size and/or use of iterative reconstruction technique. COMPARISON:  Contrast CT 12/08/2022 FINDINGS: Lower chest: Lung bases are clear. Trace pericardial fluid versus thickening. Hepatobiliary: On this non IV contrast  exam, the liver is grossly preserved. The gallbladder is nondilated. Pancreas: Unremarkable. No pancreatic ductal dilatation or surrounding inflammatory changes. Spleen: Normal in size without focal abnormality. Adrenals/Urinary Tract: The adrenal glands are preserved. There is punctate nonobstructing lower pole left-sided renal stone. No right-sided renal stone. No ureteral stones. Preserved contours of the urinary bladder. Stomach/Bowel: Focal areas of fatty infiltration or lipomas along the sigmoid colon. The large bowel on this non oral contrast exam has a normal course and caliber with mild stool. Normal appendix extends medial to the cecum in the right hemipelvis. Small bowel is nondilated. Vascular/Lymphatic: Normal caliber aorta and IVC. No specific abnormal lymph node enlargement seen in the abdomen and pelvis. Reproductive: Uterus and ovaries are grossly preserved. There are presumed tubal ligation clips in the left midabdomen and pelvis. Please correlate with the history. Other: No free air or free fluid. Only trace stranding in the low pelvis, nonspecific and could be physiologic. Musculoskeletal: Chronic deformity of the left iliac crest. Minimal osteophytes along the lower thoracic spine. IMPRESSION: Punctate nonobstructing left-sided renal stone.  No ureteral stone. No bowel obstruction, free air or free fluid.  Normal appendix. Electronically Signed   By: Karen Kays M.D.   On: 12/12/2022 18:32   CT VENOGRAM HEAD  Result Date: 12/10/2022 CLINICAL DATA:  Dural venous sinus thrombosis suspected EXAM: CT VENOGRAM HEAD TECHNIQUE: Venographic phase images of the brain were obtained following the administration of intravenous contrast. Multiplanar reformats and maximum intensity projections were generated. RADIATION DOSE REDUCTION: This exam was performed according to the departmental dose-optimization program which includes automated exposure  control, adjustment of the mA and/or kV according to  patient size and/or use of iterative reconstruction technique. CONTRAST:  75mL OMNIPAQUE IOHEXOL 350 MG/ML SOLN COMPARISON:  Same day CTA head. FINDINGS: No evidence of dural venous sinus thrombosis. The superior sagittal, sigmoid, transverse, straight, and visualized deep cerebral veins are patent. Left sigmoid/transverse sinuses are hypoplastic/small. Symmetric cavernous sinus opacification. IMPRESSION: No evidence of dural venous sinus thrombosis. Electronically Signed   By: Feliberto Harts M.D.   On: 12/10/2022 11:35   CT Angio Head W or Wo Contrast  Result Date: 12/10/2022 CLINICAL DATA:  Initial evaluation for acute headache. EXAM: CT ANGIOGRAPHY HEAD AND NECK TECHNIQUE: Multidetector CT imaging of the head and neck was performed using the standard protocol during bolus administration of intravenous contrast. Multiplanar CT image reconstructions and MIPs were obtained to evaluate the vascular anatomy. Carotid stenosis measurements (when applicable) are obtained utilizing NASCET criteria, using the distal internal carotid diameter as the denominator. RADIATION DOSE REDUCTION: This exam was performed according to the departmental dose-optimization program which includes automated exposure control, adjustment of the mA and/or kV according to patient size and/or use of iterative reconstruction technique. CONTRAST:  75mL OMNIPAQUE IOHEXOL 350 MG/ML SOLN COMPARISON:  CT from earlier the same day. FINDINGS: CTA NECK FINDINGS Aortic arch: Visualized aortic arch normal in caliber with standard branching pattern. No stenosis about the origin of the great vessels. Right carotid system: Right common and internal carotid arteries widely patent without stenosis, dissection or occlusion. Left carotid system: Left common and internal carotid arteries widely patent without stenosis, dissection or occlusion. Vertebral arteries: Left vertebral artery arises directly from the aortic arch. Vertebral arteries are patent  without stenosis or dissection. Skeleton: No discrete or worrisome osseous lesions. Other neck: No other acute soft tissue abnormality within the neck. Upper chest: Visualized upper chest demonstrates no acute finding. Review of the MIP images confirms the above findings CTA HEAD FINDINGS Anterior circulation: Both internal carotid arteries are patent to the termini without stenosis or other abnormality. A1 segments patent bilaterally. Normal anterior communicating complex. Anterior cerebral arteries are widely patent without stenosis. No M1 stenosis or occlusion. No proximal MCA branch occlusion or high-grade stenosis. Distal MCA branches perfused and symmetric. Posterior circulation: Both V4 segments are widely patent. Right vertebral artery slightly dominant. Both PICA are patent at their origins. Basilar patent without stenosis. Superior cerebellar and posterior cerebral arteries patent bilaterally. Venous sinuses: Patent allowing for timing the contrast bolus. Anatomic variants: None significant.  No aneurysm. Review of the MIP images confirms the above findings IMPRESSION: Normal CTA of the head and neck. No large vessel occlusion, hemodynamically significant stenosis, or other acute vascular abnormality. No aneurysm. Electronically Signed   By: Rise Mu M.D.   On: 12/10/2022 02:38   CT Angio Neck W and/or Wo Contrast  Result Date: 12/10/2022 CLINICAL DATA:  Initial evaluation for acute headache. EXAM: CT ANGIOGRAPHY HEAD AND NECK TECHNIQUE: Multidetector CT imaging of the head and neck was performed using the standard protocol during bolus administration of intravenous contrast. Multiplanar CT image reconstructions and MIPs were obtained to evaluate the vascular anatomy. Carotid stenosis measurements (when applicable) are obtained utilizing NASCET criteria, using the distal internal carotid diameter as the denominator. RADIATION DOSE REDUCTION: This exam was performed according to the  departmental dose-optimization program which includes automated exposure control, adjustment of the mA and/or kV according to patient size and/or use of iterative reconstruction technique. CONTRAST:  75mL OMNIPAQUE IOHEXOL 350 MG/ML SOLN COMPARISON:  CT  from earlier the same day. FINDINGS: CTA NECK FINDINGS Aortic arch: Visualized aortic arch normal in caliber with standard branching pattern. No stenosis about the origin of the great vessels. Right carotid system: Right common and internal carotid arteries widely patent without stenosis, dissection or occlusion. Left carotid system: Left common and internal carotid arteries widely patent without stenosis, dissection or occlusion. Vertebral arteries: Left vertebral artery arises directly from the aortic arch. Vertebral arteries are patent without stenosis or dissection. Skeleton: No discrete or worrisome osseous lesions. Other neck: No other acute soft tissue abnormality within the neck. Upper chest: Visualized upper chest demonstrates no acute finding. Review of the MIP images confirms the above findings CTA HEAD FINDINGS Anterior circulation: Both internal carotid arteries are patent to the termini without stenosis or other abnormality. A1 segments patent bilaterally. Normal anterior communicating complex. Anterior cerebral arteries are widely patent without stenosis. No M1 stenosis or occlusion. No proximal MCA branch occlusion or high-grade stenosis. Distal MCA branches perfused and symmetric. Posterior circulation: Both V4 segments are widely patent. Right vertebral artery slightly dominant. Both PICA are patent at their origins. Basilar patent without stenosis. Superior cerebellar and posterior cerebral arteries patent bilaterally. Venous sinuses: Patent allowing for timing the contrast bolus. Anatomic variants: None significant.  No aneurysm. Review of the MIP images confirms the above findings IMPRESSION: Normal CTA of the head and neck. No large vessel  occlusion, hemodynamically significant stenosis, or other acute vascular abnormality. No aneurysm. Electronically Signed   By: Rise Mu M.D.   On: 12/10/2022 02:38   CT Head Wo Contrast  Result Date: 12/10/2022 CLINICAL DATA:  Headache and dizziness in a 27 year old female EXAM: CT HEAD WITHOUT CONTRAST TECHNIQUE: Contiguous axial images were obtained from the base of the skull through the vertex without intravenous contrast. RADIATION DOSE REDUCTION: This exam was performed according to the departmental dose-optimization program which includes automated exposure control, adjustment of the mA and/or kV according to patient size and/or use of iterative reconstruction technique. COMPARISON:  CT head 12/08/2022 FINDINGS: Brain: No intracranial hemorrhage, mass effect, or evidence of acute infarct. No hydrocephalus. No extra-axial fluid collection. Vascular: No hyperdense vessel or unexpected calcification. Skull: No fracture or focal lesion. Sinuses/Orbits: No acute finding. Paranasal sinuses and mastoid air cells are well aerated. Other: None. IMPRESSION: No acute intracranial process. Electronically Signed   By: Minerva Fester M.D.   On: 12/10/2022 00:45   CT Angio Chest PE W and/or Wo Contrast  Result Date: 12/09/2022 CLINICAL DATA:  Pulmonary embolism suspected, high probability. Abdominal pain. EXAM: CT ANGIOGRAPHY CHEST CT ABDOMEN AND PELVIS WITH CONTRAST TECHNIQUE: Multidetector CT imaging of the chest was performed using the standard protocol during bolus administration of intravenous contrast. Multiplanar CT image reconstructions and MIPs were obtained to evaluate the vascular anatomy. Multidetector CT imaging of the abdomen and pelvis was performed using the standard protocol during bolus administration of intravenous contrast. RADIATION DOSE REDUCTION: This exam was performed according to the departmental dose-optimization program which includes automated exposure control, adjustment of  the mA and/or kV according to patient size and/or use of iterative reconstruction technique. CONTRAST:  OMNIPAQUE IOHEXOL 350 MG/ML SOLN COMPARISON:  None Available. FINDINGS: CTA CHEST FINDINGS Cardiovascular: The heart is normal in size and there is no pericardial effusion. The aorta and pulmonary trunk are normal in caliber. No evidence of pulmonary embolism. Mediastinum/Nodes: No enlarged mediastinal, hilar, or axillary lymph nodes. Thyroid gland, trachea, and esophagus demonstrate no significant findings. Lungs/Pleura: There are trace bilateral pleural  effusions. No pneumothorax. Musculoskeletal: No acute osseous abnormality. Review of the MIP images confirms the above findings. CT ABDOMEN and PELVIS FINDINGS Hepatobiliary: No focal liver abnormality is seen. No gallstones, gallbladder wall thickening, or biliary dilatation. Pancreas: Unremarkable. No pancreatic ductal dilatation or surrounding inflammatory changes. Spleen: Normal in size without focal abnormality. Adrenals/Urinary Tract: Adrenal glands are unremarkable. Kidneys are normal, without renal calculi, focal lesion, or hydronephrosis. Bladder is unremarkable. Stomach/Bowel: Stomach is within normal limits. Appendix appears normal. No evidence of bowel wall thickening, distention, or inflammatory changes. No free air or pneumatosis. Scattered diverticula are present along the colon without evidence of diverticulitis. Multiple fat attenuation lesions are present in the sigmoid colon, the largest measuring 2.7 cm, suggesting lipomas. Vascular/Lymphatic: No significant vascular findings are present. No enlarged abdominal or pelvic lymph nodes. Reproductive: Uterus and bilateral adnexa are unremarkable. Other: No abdominopelvic ascites. Musculoskeletal: No acute osseous abnormality. Review of the MIP images confirms the above findings. IMPRESSION: 1. No evidence of pulmonary embolism. 2. Trace bilateral pleural effusions. 3. Scattered colonic  diverticula without evidence of diverticulitis. 4. Multiple fat attenuation lesions in the sigmoid colon suggesting lipomas. Electronically Signed   By: Thornell Sartorius M.D.   On: 12/09/2022 02:45   CT ABDOMEN PELVIS W CONTRAST  Result Date: 12/09/2022 CLINICAL DATA:  Pulmonary embolism suspected, high probability. Abdominal pain. EXAM: CT ANGIOGRAPHY CHEST CT ABDOMEN AND PELVIS WITH CONTRAST TECHNIQUE: Multidetector CT imaging of the chest was performed using the standard protocol during bolus administration of intravenous contrast. Multiplanar CT image reconstructions and MIPs were obtained to evaluate the vascular anatomy. Multidetector CT imaging of the abdomen and pelvis was performed using the standard protocol during bolus administration of intravenous contrast. RADIATION DOSE REDUCTION: This exam was performed according to the departmental dose-optimization program which includes automated exposure control, adjustment of the mA and/or kV according to patient size and/or use of iterative reconstruction technique. CONTRAST:  OMNIPAQUE IOHEXOL 350 MG/ML SOLN COMPARISON:  None Available. FINDINGS: CTA CHEST FINDINGS Cardiovascular: The heart is normal in size and there is no pericardial effusion. The aorta and pulmonary trunk are normal in caliber. No evidence of pulmonary embolism. Mediastinum/Nodes: No enlarged mediastinal, hilar, or axillary lymph nodes. Thyroid gland, trachea, and esophagus demonstrate no significant findings. Lungs/Pleura: There are trace bilateral pleural effusions. No pneumothorax. Musculoskeletal: No acute osseous abnormality. Review of the MIP images confirms the above findings. CT ABDOMEN and PELVIS FINDINGS Hepatobiliary: No focal liver abnormality is seen. No gallstones, gallbladder wall thickening, or biliary dilatation. Pancreas: Unremarkable. No pancreatic ductal dilatation or surrounding inflammatory changes. Spleen: Normal in size without focal abnormality.  Adrenals/Urinary Tract: Adrenal glands are unremarkable. Kidneys are normal, without renal calculi, focal lesion, or hydronephrosis. Bladder is unremarkable. Stomach/Bowel: Stomach is within normal limits. Appendix appears normal. No evidence of bowel wall thickening, distention, or inflammatory changes. No free air or pneumatosis. Scattered diverticula are present along the colon without evidence of diverticulitis. Multiple fat attenuation lesions are present in the sigmoid colon, the largest measuring 2.7 cm, suggesting lipomas. Vascular/Lymphatic: No significant vascular findings are present. No enlarged abdominal or pelvic lymph nodes. Reproductive: Uterus and bilateral adnexa are unremarkable. Other: No abdominopelvic ascites. Musculoskeletal: No acute osseous abnormality. Review of the MIP images confirms the above findings. IMPRESSION: 1. No evidence of pulmonary embolism. 2. Trace bilateral pleural effusions. 3. Scattered colonic diverticula without evidence of diverticulitis. 4. Multiple fat attenuation lesions in the sigmoid colon suggesting lipomas. Electronically Signed   By: Charlestine Night.D.  On: 12/09/2022 02:45   CT Head Wo Contrast  Result Date: 12/08/2022 CLINICAL DATA:  Headache, increasing frequency or severity EXAM: CT HEAD WITHOUT CONTRAST TECHNIQUE: Contiguous axial images were obtained from the base of the skull through the vertex without intravenous contrast. RADIATION DOSE REDUCTION: This exam was performed according to the departmental dose-optimization program which includes automated exposure control, adjustment of the mA and/or kV according to patient size and/or use of iterative reconstruction technique. COMPARISON:  None Available. FINDINGS: Brain: No evidence of acute infarction, hemorrhage, hydrocephalus, extra-axial collection or mass lesion/mass effect. Vascular: No hyperdense vessel. Skull: No acute fracture. Sinuses/Orbits: Mild paranasal sinus mucosal thickening. No acute  orbital findings. Other: No mastoid effusions. IMPRESSION: No acute intracranial abnormality. Electronically Signed   By: Feliberto Harts M.D.   On: 12/08/2022 17:18     Subjective: No acute issues/events overnight   Discharge Exam: Vitals:   12/14/22 0600 12/14/22 1328  BP:  115/75  Pulse:  94  Resp: (!) 22 20  Temp:  97.6 F (36.4 C)  SpO2:  100%   Vitals:   12/14/22 0456 12/14/22 0500 12/14/22 0600 12/14/22 1328  BP: 133/86   115/75  Pulse: 95   94  Resp: 17  (!) 22 20  Temp: 99.2 F (37.3 C)   97.6 F (36.4 C)  TempSrc: Oral   Oral  SpO2: 100%   100%  Weight:  96.8 kg    Height:        General: Pt is alert, awake, not in acute distress Cardiovascular: RRR, S1/S2 +, no rubs, no gallops Respiratory: CTA bilaterally, no wheezing, no rhonchi Abdominal: Soft, NT, ND, bowel sounds + Extremities: no edema, no cyanosis    The results of significant diagnostics from this hospitalization (including imaging, microbiology, ancillary and laboratory) are listed below for reference.     Microbiology: Recent Results (from the past 240 hour(s))  Resp panel by RT-PCR (RSV, Flu A&B, Covid) Anterior Nasal Swab     Status: None   Collection Time: 12/08/22  4:20 PM   Specimen: Anterior Nasal Swab  Result Value Ref Range Status   SARS Coronavirus 2 by RT PCR NEGATIVE NEGATIVE Final   Influenza A by PCR NEGATIVE NEGATIVE Final   Influenza B by PCR NEGATIVE NEGATIVE Final    Comment: (NOTE) The Xpert Xpress SARS-CoV-2/FLU/RSV plus assay is intended as an aid in the diagnosis of influenza from Nasopharyngeal swab specimens and should not be used as a sole basis for treatment. Nasal washings and aspirates are unacceptable for Xpert Xpress SARS-CoV-2/FLU/RSV testing.  Fact Sheet for Patients: BloggerCourse.com  Fact Sheet for Healthcare Providers: SeriousBroker.it  This test is not yet approved or cleared by the [place]  FDA and has been authorized for detection and/or diagnosis of SARS-CoV-2 by FDA under an Emergency Use Authorization (EUA). This EUA will remain in effect (meaning this test can be used) for the duration of the COVID-19 declaration under Section 564(b)(1) of the Act, 21 U.S.C. section 360bbb-3(b)(1), unless the authorization is terminated or revoked.     Resp Syncytial Virus by PCR NEGATIVE NEGATIVE Final    Comment: (NOTE) Fact Sheet for Patients: BloggerCourse.com  Fact Sheet for Healthcare Providers: SeriousBroker.it  This test is not yet approved or cleared by the [place] FDA and has been authorized for detection and/or diagnosis of SARS-CoV-2 by FDA under an Emergency Use Authorization (EUA). This EUA will remain in effect (meaning this test can be used) for the duration of the COVID-19  declaration under Section 564(b)(1) of the Act, 21 U.S.C. section 360bbb-3(b)(1), unless the authorization is terminated or revoked.  Performed at [facility], [address], [place], [place] [address]   SARS Coronavirus 2 by RT PCR (hospital order, performed in [facility] hospital lab) *cepheid single result test*     Status: None   Collection Time: 12/12/22  5:29 PM  Result Value Ref Range Status   SARS Coronavirus 2 by RT PCR NEGATIVE NEGATIVE Final    Comment: (NOTE) SARS-CoV-2 target nucleic acids are NOT DETECTED.  The SARS-CoV-2 RNA is generally detectable in upper and lower respiratory specimens during the acute phase of infection. The lowest concentration of SARS-CoV-2 viral copies this assay can detect is 250 copies / mL. A negative result does not preclude SARS-CoV-2 infection and should not be used as the sole basis for treatment or other patient management decisions.  A negative result may occur with improper specimen collection / handling, submission of specimen other than nasopharyngeal swab, presence of viral  mutation(s) within the areas targeted by this assay, and inadequate number of viral copies (<250 copies / mL). A negative result must be combined with clinical observations, patient history, and epidemiological information.  Fact Sheet for Patients:   RoadLapTop.co.za  Fact Sheet for Healthcare Providers: http://kim-miller.com/  This test is not yet approved or  cleared by the [place] FDA and has been authorized for detection and/or diagnosis of SARS-CoV-2 by FDA under an Emergency Use Authorization (EUA).  This EUA will remain in effect (meaning this test can be used) for the duration of the COVID-19 declaration under Section 564(b)(1) of the Act, 21 U.S.C. section 360bbb-3(b)(1), unless the authorization is terminated or revoked sooner.  Performed at [facility], [address] [place], [place] [address]   Resp panel by RT-PCR (RSV, Flu A&B, Covid)     Status: None   Collection Time: 12/12/22  5:29 PM  Result Value Ref Range Status   SARS Coronavirus 2 by RT PCR NEGATIVE NEGATIVE Final   Influenza A by PCR NEGATIVE NEGATIVE Final   Influenza B by PCR NEGATIVE NEGATIVE Final    Comment: (NOTE) The Xpert Xpress SARS-CoV-2/FLU/RSV plus assay is intended as an aid in the diagnosis of influenza from Nasopharyngeal swab specimens and should not be used as a sole basis for treatment. Nasal washings and aspirates are unacceptable for Xpert Xpress SARS-CoV-2/FLU/RSV testing.  Fact Sheet for Patients: BloggerCourse.com  Fact Sheet for Healthcare Providers: SeriousBroker.it  This test is not yet approved or cleared by the [place] FDA and has been authorized for detection and/or diagnosis of SARS-CoV-2 by FDA under an Emergency Use Authorization (EUA). This EUA will remain in effect (meaning this test can be used) for the duration of the COVID-19 declaration  under Section 564(b)(1) of the Act, 21 U.S.C. section 360bbb-3(b)(1), unless the authorization is terminated or revoked.     Resp Syncytial Virus by PCR NEGATIVE NEGATIVE Final    Comment: (NOTE) Fact Sheet for Patients: BloggerCourse.com  Fact Sheet for Healthcare Providers: SeriousBroker.it  This test is not yet approved or cleared by the [place] FDA and has been authorized for detection and/or diagnosis of SARS-CoV-2 by FDA under an Emergency Use Authorization (EUA). This EUA will remain in effect (meaning this test can be used) for the duration of the COVID-19 declaration under Section 564(b)(1) of the Act, 21 U.S.C. section 360bbb-3(b)(1), unless the authorization is terminated or revoked.  Performed at [facility], [address] [place], [place] [address]   CSF culture w Gram Stain     Status: None (Preliminary result)   Collection Time: 12/13/22  1:00 PM   Specimen: CSF; Cerebrospinal Fluid  Result Value Ref Range Status   Specimen Description   Final    CSF Performed at [facility], [address], [place], [place] [address]    Special Requests   Final    NONE Performed at [facility], [address] [place], [place] [address]    Gram Stain   Final    WBC PRESENT, PREDOMINANTLY MONONUCLEAR NO ORGANISMS SEEN CYTOSPIN Gram Stain Report Called to,Read Back By and Verified With: ORAEGBUNAM,I. RN @1452  ON 4.16.2024 BY [facility]    Culture PENDING  Incomplete   Report Status PENDING  Incomplete     Labs: BNP (last 3 results) No results for input(s): "BNP" in the last 8760 hours. Basic Metabolic Panel: Recent Labs  Lab 12/08/22 2220 12/10/22 0000 12/12/22 1933 12/12/22 2320 12/13/22 0524 12/14/22 0522  NA 135 131* 137  --  133* 136  K 2.8* 3.1* 2.6*  --  3.3* 4.0  CL 99 97* 101  --  101 104  CO2 24 24 28   --  27 25  GLUCOSE 99 155* 101*  --  91 84  BUN  <5* 7 <5*  --  6 <5*  CREATININE 0.65 0.74 0.67 0.77 0.72 0.64  CALCIUM 8.9 8.5* 7.9*  --  7.5* 8.1*  MG 2.3  --   --   --   --   --    Liver Function Tests: Recent Labs  Lab 12/08/22 2220 12/10/22 0000 12/12/22 1933 12/13/22 0524 12/14/22 0522  AST 20 24 15 15 16   ALT 11 14 13 11 12   ALKPHOS 59 54 45 46 43  BILITOT 1.2 0.8 0.9 0.9 0.7  PROT 8.3* 8.3* 7.0 6.5 6.9  ALBUMIN 3.6 3.7 3.1* 3.0* 3.1*   Recent Labs  Lab 12/08/22 2220 12/10/22 0000  LIPASE 24 26   No results for input(s): "AMMONIA" in the last 168 hours. CBC: Recent Labs  Lab 12/10/22 0000 12/12/22 1754 12/12/22 2320 12/13/22 0524 12/14/22 0522  WBC 6.5 6.8 7.0 5.7 5.5  NEUTROABS  --  3.3  --   --   --   HGB 11.5* 12.4 11.0* 10.9* 10.8*  HCT 33.3* 36.4 32.8* 32.2* 33.1*  MCV 91.0 91.9 92.7 94.2 95.7  PLT 384 387 388 342 332   Cardiac Enzymes: Recent Labs  Lab 12/12/22 1933  CKTOTAL 46   BNP: Invalid input(s): "POCBNP" CBG: Recent Labs  Lab 12/13/22 1202 12/13/22 1655 12/13/22 2102 12/14/22 0756 12/14/22 1227  GLUCAP 95 96 86 80 101*   D-Dimer No results for input(s): "DDIMER" in the last 72 hours. Hgb A1c Recent Labs    12/12/22 2320 12/13/22 0248  HGBA1C 4.8 5.0   Lipid Profile No results for input(s): "CHOL", "HDL", "LDLCALC", "TRIG", "CHOLHDL", "LDLDIRECT" in the last 72 hours. Thyroid function studies No results for input(s): "TSH", "T4TOTAL", "T3FREE", "THYROIDAB" in the last 72 hours.  Invalid input(s): "FREET3" Anemia work up Recent Labs    12/14/22 0522  VITAMINB12 220  FOLATE 9.3  FERRITIN 141  TIBC 202*  IRON 43  RETICCTPCT 2.6   Urinalysis    Component Value Date/Time   COLORURINE YELLOW 12/12/2022 2135   APPEARANCEUR CLEAR 12/12/2022 2135   LABSPEC 1.002 (L) 12/12/2022 2135   PHURINE 7.0 12/12/2022 2135  GLUCOSEU NEGATIVE 12/12/2022 2135   HGBUR NEGATIVE 12/12/2022 2135   BILIRUBINUR NEGATIVE 12/12/2022 2135   BILIRUBINUR neg 07/02/2018 1055    KETONESUR NEGATIVE 12/12/2022 2135   PROTEINUR NEGATIVE 12/12/2022 2135   UROBILINOGEN 0.2 07/02/2018 1055   UROBILINOGEN 1.0 12/27/2014 1716   NITRITE NEGATIVE 12/12/2022 2135   LEUKOCYTESUR TRACE (A) 12/12/2022 2135   Sepsis Labs Recent Labs  Lab 12/12/22 1754 12/12/22 2320 12/13/22 0524 12/14/22 0522  WBC 6.8 7.0 5.7 5.5   Microbiology Recent Results (from the past 240 hour(s))  Resp panel by RT-PCR (RSV, Flu A&B, Covid) Anterior Nasal Swab     Status: None   Collection Time: 12/08/22  4:20 PM   Specimen: Anterior Nasal Swab  Result Value Ref Range Status   SARS Coronavirus 2 by RT PCR NEGATIVE NEGATIVE Final   Influenza A by PCR NEGATIVE NEGATIVE Final   Influenza B by PCR NEGATIVE NEGATIVE Final    Comment: (NOTE) The Xpert Xpress SARS-CoV-2/FLU/RSV plus assay is intended as an aid in the diagnosis of influenza from Nasopharyngeal swab specimens and should not be used as a sole basis for treatment. Nasal washings and aspirates are unacceptable for Xpert Xpress SARS-CoV-2/FLU/RSV testing.  Fact Sheet for Patients: BloggerCourse.com  Fact Sheet for Healthcare Providers: SeriousBroker.it  This test is not yet approved or cleared by the [place] FDA and has been authorized for detection and/or diagnosis of SARS-CoV-2 by FDA under an Emergency Use Authorization (EUA). This EUA will remain in effect (meaning this test can be used) for the duration of the COVID-19 declaration under Section 564(b)(1) of the Act, 21 U.S.C. section 360bbb-3(b)(1), unless the authorization is terminated or revoked.     Resp Syncytial Virus by PCR NEGATIVE NEGATIVE Final    Comment: (NOTE) Fact Sheet for Patients: BloggerCourse.com  Fact Sheet for Healthcare Providers: SeriousBroker.it  This test is not yet approved or cleared by the [place] FDA and has been authorized for  detection and/or diagnosis of SARS-CoV-2 by FDA under an Emergency Use Authorization (EUA). This EUA will remain in effect (meaning this test can be used) for the duration of the COVID-19 declaration under Section 564(b)(1) of the Act, 21 U.S.C. section 360bbb-3(b)(1), unless the authorization is terminated or revoked.  Performed at [facility], [address] [place], [place] [address]   SARS Coronavirus 2 by RT PCR (hospital order, performed in [facility] hospital lab) *cepheid single result test*     Status: None   Collection Time: 12/12/22  5:29 PM  Result Value Ref Range Status   SARS Coronavirus 2 by RT PCR NEGATIVE NEGATIVE Final    Comment: (NOTE) SARS-CoV-2 target nucleic acids are NOT DETECTED.  The SARS-CoV-2 RNA is generally detectable in upper and lower respiratory specimens during the acute phase of infection. The lowest concentration of SARS-CoV-2 viral copies this assay can detect is 250 copies / mL. A negative result does not preclude SARS-CoV-2 infection and should not be used as the sole basis for treatment or other patient management decisions.  A negative result may occur with improper specimen collection / handling, submission of specimen other than nasopharyngeal swab, presence of viral mutation(s) within the areas targeted by this assay, and inadequate number of viral copies (<250 copies / mL). A negative result must be combined with clinical observations, patient history, and epidemiological information.  Fact Sheet for Patients:   RoadLapTop.co.za  Fact Sheet for Healthcare Providers: http://kim-miller.com/  This test is not yet approved or  cleared  by the [place] and has been authorized for detection and/or diagnosis of SARS-CoV-2 by FDA under an Emergency Use Authorization (EUA).  This EUA will remain in effect (meaning this test can be used) for the duration of the COVID-19 declaration  under Section 564(b)(1) of the Act, 21 U.S.C. section 360bbb-3(b)(1), unless the authorization is terminated or revoked sooner.  Performed at [facility] [address] [place], [place] [address]   Resp panel by RT-PCR (RSV, Flu A&B, Covid)     Status: None   Collection Time: 12/12/22  5:29 PM  Result Value Ref Range Status   SARS Coronavirus 2 by RT PCR NEGATIVE NEGATIVE Final   Influenza A by PCR NEGATIVE NEGATIVE Final   Influenza B by PCR NEGATIVE NEGATIVE Final    Comment: (NOTE) The Xpert Xpress SARS-CoV-2/FLU/RSV plus assay is intended as an aid in the diagnosis of influenza from Nasopharyngeal swab specimens and should not be used as a sole basis for treatment. Nasal washings and aspirates are unacceptable for Xpert Xpress SARS-CoV-2/FLU/RSV testing.  Fact Sheet for Patients: BloggerCourse.com  Fact Sheet for Healthcare Providers: SeriousBroker.it  This test is not yet approved or cleared by the [place] FDA and has been authorized for detection and/or diagnosis of SARS-CoV-2 by FDA under an Emergency Use Authorization (EUA). This EUA will remain in effect (meaning this test can be used) for the duration of the COVID-19 declaration under Section 564(b)(1) of the Act, 21 U.S.C. section 360bbb-3(b)(1), unless the authorization is terminated or revoked.     Resp Syncytial Virus by PCR NEGATIVE NEGATIVE Final    Comment: (NOTE) Fact Sheet for Patients: BloggerCourse.com  Fact Sheet for Healthcare Providers: SeriousBroker.it  This test is not yet approved or cleared by the [place] FDA and has been authorized for detection and/or diagnosis of SARS-CoV-2 by FDA under an Emergency Use Authorization (EUA). This EUA will remain in effect (meaning this test can be used) for the duration of the COVID-19 declaration under Section 564(b)(1) of the  Act, 21 U.S.C. section 360bbb-3(b)(1), unless the authorization is terminated or revoked.  Performed at [facility], [address], [place], [place] [address]   CSF culture w Gram Stain     Status: None (Preliminary result)   Collection Time: 12/13/22  1:00 PM   Specimen: CSF; Cerebrospinal Fluid  Result Value Ref Range Status   Specimen Description   Final    CSF Performed at [facility], [address], [place], [place] [address]    Special Requests   Final    NONE Performed at [facility], [address] [place], [place] [address]    Gram Stain   Final    WBC PRESENT, PREDOMINANTLY MONONUCLEAR NO ORGANISMS SEEN CYTOSPIN Gram Stain Report Called to,Read Back By and Verified With: Minna Merritts RN  ON 4.16.2024 BY [facility]    Culture PENDING  Incomplete   Report Status PENDING  Incomplete     Time coordinating discharge: Over 30 minutes  SIGNED:   Azucena Fallen, DO [facility] 12/14/2022, 3:48 PM Pager   If 7PM-7AM, please contact night-coverage [email or web address]

## 2022-12-14 NOTE — Plan of Care (Signed)
CSF ID panel is negative for all bacterial and viral pathogens tested, including HSV.  Antibiotics and acyclovir have been discontinued.   Overall presentation most consistent with aseptic meningitis.  Can be discharged home with outpatient neurology follow up.   Electronically signed: Dr. Caryl Pina

## 2022-12-15 LAB — CSF CULTURE W GRAM STAIN

## 2022-12-17 LAB — CSF CULTURE W GRAM STAIN: Culture: NO GROWTH

## 2023-04-20 ENCOUNTER — Encounter (HOSPITAL_COMMUNITY): Payer: Self-pay | Admitting: *Deleted

## 2023-04-20 ENCOUNTER — Ambulatory Visit (HOSPITAL_COMMUNITY)
Admission: EM | Admit: 2023-04-20 | Discharge: 2023-04-20 | Disposition: A | Discharge status: Home / Self Care | Payer: Medicaid Other | Attending: Family Medicine | Admitting: Family Medicine

## 2023-04-20 DIAGNOSIS — S62664A Nondisplaced fracture of distal phalanx of right ring finger, initial encounter for closed fracture: Secondary | ICD-10-CM

## 2023-04-20 LAB — POCT URINE PREGNANCY: Preg Test, Ur: NEGATIVE

## 2023-04-20 MED ORDER — KETOROLAC TROMETHAMINE 30 MG/ML IJ SOLN
30.0000 mg | Freq: Once | INTRAMUSCULAR | Status: AC
  Administered 2023-04-20: 30 mg via INTRAMUSCULAR

## 2023-04-20 MED ORDER — KETOROLAC TROMETHAMINE 10 MG PO TABS: 10.0000 mg | ORAL_TABLET | Freq: Four times a day (QID) | ORAL | 0 refills | Status: AC | PRN

## 2023-04-20 MED ORDER — KETOROLAC TROMETHAMINE 30 MG/ML IJ SOLN
INTRAMUSCULAR | Status: AC
  Filled 2023-04-20: qty 1

## 2023-04-20 NOTE — Discharge Instructions (Addendum)
The pregnancy test was negative  You have been given a shot of Toradol 30 mg today.  Ketorolac 10 mg tablets--take 1 tablet every 6 hours as needed for pain.  This is the same medicine that is in the shot we just gave you  Ice and elevate your finger.

## 2023-04-20 NOTE — ED Triage Notes (Signed)
Pt states she has a broken right ring finger She slammed her finger in the car door. She states she seen murphy wainer yesterday and the finger was splinted. She states that the splint came off and they couldn't see her today they made her an appt for tomorrow.   She would like referred somewhere else since she called about the pain and splint coming off and they declined pain meds. She was given hydrocodone from her pcp which she states isn't working to control her pain.

## 2023-04-20 NOTE — ED Provider Notes (Signed)
[facility]    CSN: [id number] Arrival date & time: 04/20/23  1749      History   Chief Complaint Chief Complaint  Patient presents with   Finger Injury    HPI Annette Juarez is a 27 y.o. female.   HPI Here for pain in her right ring finger.  She slammed it in a car door yesterday.  She was seen in orthopedic office and the finger was splinted; they did not prescribe pain medication as she takes hydrocodone 5 mg 3 times daily already.  That medication is not controlling her pain today.    Past Medical History:  Diagnosis Date   Diabetes mellitus without complication (HCC)    Gestational diet control   GERD (gastroesophageal reflux disease)    Gestational diabetes    UTI (urinary tract infection)     Patient Active Problem List   Diagnosis Date Noted   Hypokalemia 12/12/2022   Gestational hypertension 07/22/2018   GBS (group B Streptococcus carrier), +RV culture, currently pregnant 07/05/2018   Supervision of other normal pregnancy, antepartum 04/10/2018   Late prenatal care affecting pregnancy 04/10/2018   History of gestational diabetes in prior pregnancy, currently pregnant 04/10/2018   Rh negative, antepartum 05/18/2016   Susceptible to varicella (non-immune), currently pregnant 05/12/2016   Obesity affecting pregnancy, antepartum 05/11/2016    Past Surgical History:  Procedure Laterality Date   NO PAST SURGERIES     TUBAL LIGATION Bilateral 07/23/2018   Procedure: POST PARTUM TUBAL LIGATION;  Surgeon: Hermina Staggers, MD;  Location: [facility];  Service: Gynecology;  Laterality: Bilateral;    OB History     Gravida  2   Para  2   Term  2   Preterm  0   AB  0   Living  2      SAB  0   IAB  0   Ectopic  0   Multiple  0   Live Births  2            Home Medications    Prior to Admission medications   Medication Sig Start Date End Date Taking? Authorizing Provider  HYDROcodone-acetaminophen (NORCO/VICODIN)  5-325 MG tablet Take 1 tablet by mouth 3 (three) times daily as needed. 04/06/23  Yes [provider]  ketorolac (TORADOL) 10 MG tablet Take 1 tablet (10 mg total) by mouth every 6 (six) hours as needed (pain). 04/20/23  Yes Zenia Resides, MD  Vitamin D, Ergocalciferol, (DRISDOL) 1.25 MG (50000 UNIT) CAPS capsule Take 50,000 Units by mouth once a week. 12/06/22  Yes [provider]    Family History Family History  Problem Relation Age of Onset   Hypertension Mother    Diabetes Father    Asthma Sister    Diabetes Maternal Grandmother    Hypertension Maternal Grandmother    Hypertension Maternal Grandfather     Social History Social History   Tobacco Use   Smoking status: Never   Smokeless tobacco: Never  Vaping Use   Vaping status: Never Used  Substance Use Topics   Alcohol use: No   Drug use: No     Allergies   Patient has no known allergies.   Review of Systems Review of Systems   Physical Exam Triage Vital Signs ED Triage Vitals  Encounter Vitals Group     BP 04/20/23 1809 134/85     Systolic BP Percentile --      Diastolic BP Percentile --  Pulse Rate 04/20/23 1809 86     Resp 04/20/23 1809 18     Temp 04/20/23 1809 98.2 F (36.8 C)     Temp Source 04/20/23 1809 Oral     SpO2 04/20/23 1809 98 %     Weight --      Height --      Head Circumference --      Peak Flow --      Pain Score 04/20/23 1806 10     Pain Loc --      Pain Education --      Exclude from Growth Chart --    No data found.  Updated Vital Signs BP 134/85 (BP Location: Left Arm)   Pulse 86   Temp 98.2 F (36.8 C) (Oral)   Resp 18   LMP 03/22/2023 (Approximate)   SpO2 98%   Visual Acuity Right Eye Distance:   Left Eye Distance:   Bilateral Distance:    Right Eye Near:   Left Eye Near:    Bilateral Near:     Physical Exam Vitals reviewed.  Constitutional:      Appearance: She is not toxic-appearing.     Comments: In obvious discomfort   HENT:      Mouth/Throat:     Mouth: Mucous membranes are moist.  Eyes:     Extraocular Movements: Extraocular movements intact.     Pupils: Pupils are equal, round, and reactive to light.  Musculoskeletal:     Comments: Her distal right ring finger is mildly swollen.  There is an abrasion of the nail fold proximally.  There is no drainage  Skin:    Capillary Refill: Capillary refill takes less than 2 seconds.  Neurological:     Mental Status: She is alert and oriented to person, place, and time.  Psychiatric:        Behavior: Behavior normal.      UC Treatments / Results  Labs (all labs ordered are listed, but only abnormal results are displayed) Labs Reviewed  POCT URINE PREGNANCY - Normal    EKG   Radiology No results found.  Procedures Procedures (including critical care time)  Medications Ordered in UC Medications  ketorolac (TORADOL) 30 MG/ML injection 30 mg (30 mg Intramuscular Given 04/20/23 1841)    Initial Impression / Assessment and Plan / UC Course  I have reviewed the triage vital signs and the nursing notes.  Pertinent labs & imaging results that were available during my care of the patient were reviewed by me and considered in my medical decision making (see chart for details).        UPT is negative.  She is given an injection of Toradol and Toradol tablets are sent to the pharmacy.  She is given contact information for a different orthopedic group as she wished to see someone different.  Ice and elevation is recommended.  New finger splint is applied.  Final Clinical Impressions(s) / UC Diagnoses   Final diagnoses:  Closed nondisplaced fracture of distal phalanx of right ring finger, initial encounter     Discharge Instructions      The pregnancy test was negative  You have been given a shot of Toradol 30 mg today.  Ketorolac 10 mg tablets--take 1 tablet every 6 hours as needed for pain.  This is the same medicine that is in the shot we  just gave you  Ice and elevate your finger.      ED Prescriptions     Medication  Sig Dispense Auth. Provider   ketorolac (TORADOL) 10 MG tablet Take 1 tablet (10 mg total) by mouth every 6 (six) hours as needed (pain). 20 tablet Calab Sachse, Janace Aris, MD      I have reviewed the PDMP during this encounter.   Zenia Resides, MD 04/20/23 [phone]

## 2023-10-26 ENCOUNTER — Ambulatory Visit (HOSPITAL_COMMUNITY)
Admission: EM | Admit: 2023-10-26 | Discharge: 2023-10-26 | Disposition: A | Discharge status: Home / Self Care | Payer: Medicaid Other

## 2023-10-26 ENCOUNTER — Encounter (HOSPITAL_COMMUNITY): Payer: Self-pay

## 2023-10-26 DIAGNOSIS — H9201 Otalgia, right ear: Secondary | ICD-10-CM | POA: Diagnosis not present

## 2023-10-26 NOTE — Discharge Instructions (Signed)
 At this time I suspect that you likely have a mild middle ear effusion.  This can happen when you have congestion or sore throat.  This sometimes means that you have something impeding drainage in your eustachian tubes.  Usually this is resolved with taking an over-the-counter antihistamine such as Claritin, Allegra, or Zyrtec.  Generics of these worked just as fine and are usually a little less expensive.  You can also use Flonase nasal spray to assist with further nasal congestion.  When you are administering Flonase I recommend that you use the opposite hand of the nostril that you are applying the solution to as this will help keep the Flonase in the nasal passages where prolongs.  If you are tasting the Flonase you are administering the medication in an incorrect manner. If your symptoms are persisting and not improving over the next 2 to 4 weeks please follow-up with your primary care provider.  If at any point you start to develop swelling around the ear, fevers, drainage from the ear, diminished hearing you can return here or go to the emergency room for further evaluation and management.

## 2023-10-26 NOTE — ED Triage Notes (Signed)
 Right ear fullness x 1 week. No other symptoms to report.

## 2023-10-26 NOTE — ED Provider Notes (Signed)
 [facility]    CSN: [id number] Arrival date & time: 10/26/23  1308      History   Chief Complaint Chief Complaint  Patient presents with   Otalgia    HPI Annette Juarez is a 28 y.o. female.   HPI   She reports a persistent whooshing sound in her right ear for the past 6 days  She states it has also started to hurt a bit in the last few days She has tried cleaning the area with peroxide   She reprots last month she and her children had the flu  She denies swelling around the ear or drainage. She does reports mild tenderness along the right side of her neck from the ear   Past Medical History:  Diagnosis Date   Diabetes mellitus without complication (HCC)    Gestational diet control   GERD (gastroesophageal reflux disease)    Gestational diabetes    UTI (urinary tract infection)     Patient Active Problem List   Diagnosis Date Noted   Hypokalemia 12/12/2022   Gestational hypertension 07/22/2018   GBS (group B Streptococcus carrier), +RV culture, currently pregnant 07/05/2018   Supervision of other normal pregnancy, antepartum 04/10/2018   Late prenatal care affecting pregnancy 04/10/2018   History of gestational diabetes in prior pregnancy, currently pregnant 04/10/2018   Rh negative, antepartum 05/18/2016   Susceptible to varicella (non-immune), currently pregnant 05/12/2016   Obesity affecting pregnancy, antepartum 05/11/2016    Past Surgical History:  Procedure Laterality Date   NO PAST SURGERIES     TUBAL LIGATION Bilateral 07/23/2018   Procedure: POST PARTUM TUBAL LIGATION;  Surgeon: Hermina Staggers, MD;  Location: [facility];  Service: Gynecology;  Laterality: Bilateral;    OB History     Gravida  2   Para  2   Term  2   Preterm  0   AB  0   Living  2      SAB  0   IAB  0   Ectopic  0   Multiple  0   Live Births  2            Home Medications    Prior to Admission medications   Medication Sig Start  Date End Date Taking? Authorizing Provider  HYDROcodone-acetaminophen (NORCO/VICODIN) 5-325 MG tablet Take 1 tablet by mouth 3 (three) times daily as needed. 04/06/23   [provider]  ketorolac (TORADOL) 10 MG tablet Take 1 tablet (10 mg total) by mouth every 6 (six) hours as needed (pain). 04/20/23   Zenia Resides, MD  Vitamin D, Ergocalciferol, (DRISDOL) 1.25 MG (50000 UNIT) CAPS capsule Take 50,000 Units by mouth once a week. 12/06/22   [provider]    Family History Family History  Problem Relation Age of Onset   Hypertension Mother    Diabetes Father    Asthma Sister    Diabetes Maternal Grandmother    Hypertension Maternal Grandmother    Hypertension Maternal Grandfather     Social History Social History   Tobacco Use   Smoking status: Never   Smokeless tobacco: Never  Vaping Use   Vaping status: Never Used  Substance Use Topics   Alcohol use: No   Drug use: No     Allergies   Patient has no known allergies.   Review of Systems Review of Systems  Constitutional:  Negative for chills, fatigue and fever.  HENT:  Positive for ear pain. Negative for  congestion, ear discharge and sore throat.   Gastrointestinal:  Negative for nausea and vomiting.  Neurological:  Negative for dizziness and light-headedness.     Physical Exam Triage Vital Signs ED Triage Vitals  Encounter Vitals Group     BP 10/26/23 0843 (!) 125/92     Systolic BP Percentile --      Diastolic BP Percentile --      Pulse Rate 10/26/23 0843 94     Resp 10/26/23 0843 14     Temp 10/26/23 0841 97.9 F (36.6 C)     Temp Source 10/26/23 0841 Oral     SpO2 10/26/23 0843 95 %     Weight --      Height --      Head Circumference --      Peak Flow --      Pain Score --      Pain Loc --      Pain Education --      Exclude from Growth Chart --    No data found.  Updated Vital Signs BP (!) 125/92 (BP Location: Left Arm)   Pulse 94   Temp 97.9 F (36.6 C) (Oral)   Resp  14   LMP 10/24/2023   SpO2 95%   Visual Acuity Right Eye Distance:   Left Eye Distance:   Bilateral Distance:    Right Eye Near:   Left Eye Near:    Bilateral Near:     Physical Exam Vitals reviewed.  Constitutional:      Appearance: Normal appearance.  HENT:     Head: Normocephalic and atraumatic.     Right Ear: Hearing, tympanic membrane and ear canal normal.     Left Ear: Hearing, tympanic membrane and ear canal normal.     Mouth/Throat:     Lips: Pink.     Mouth: Mucous membranes are moist.     Pharynx: Oropharynx is clear. Uvula midline. No pharyngeal swelling, oropharyngeal exudate, posterior oropharyngeal erythema, uvula swelling or postnasal drip.  Musculoskeletal:     Cervical back: Normal range of motion and neck supple. No pain with movement.  Lymphadenopathy:     Head:     Right side of head: No submental, submandibular or preauricular adenopathy.     Left side of head: No submental, submandibular or preauricular adenopathy.     Cervical:     Right cervical: No superficial cervical adenopathy.    Left cervical: No superficial cervical adenopathy.     Upper Body:     Right upper body: No supraclavicular adenopathy.     Left upper body: No supraclavicular adenopathy.  Skin:    General: Skin is warm and dry.  Neurological:     General: No focal deficit present.     Mental Status: She is alert and oriented to person, place, and time.  Psychiatric:        Mood and Affect: Mood normal.        Behavior: Behavior normal.        Thought Content: Thought content normal.        Judgment: Judgment normal.      UC Treatments / Results  Labs (all labs ordered are listed, but only abnormal results are displayed) Labs Reviewed - No data to display  EKG   Radiology No results found.  Procedures Procedures (including critical care time)  Medications Ordered in UC Medications - No data to display  Initial Impression / Assessment and Plan / UC Course  I  have reviewed the triage vital signs and the nursing notes.  Pertinent labs & imaging results that were available during my care of the patient were reviewed by me and considered in my medical decision making (see chart for details).      Final Clinical Impressions(s) / UC Diagnoses   Final diagnoses:  Right ear pain   Patient presents with right ear pain for the past 6 days.  She reports that she has also had an odd sound like this year describing it as a whooshing noise.  Physical exam is overall reassuring with normal tympanic membranes bilaterally and no signs of effusion, swelling, acute infection at this time.  Throat and mucous membranes are also normal on exam.  There is no associated lymphadenopathy in the neck, swelling, redness around the right ear.  At this time I suspect that she might have mild eustachian tube dysfunction or a very minor middle ear effusion.  Recommend starting a second-generation antihistamine as well as Flonase for management at this time.  Return precautions provided in after visit summary.  ED precautions were also reviewed with her.  Recommend that she follows up with her primary care provider if her symptoms are persisting as she may need a referral to ENT.    Discharge Instructions      At this time I suspect that you likely have a mild middle ear effusion.  This can happen when you have congestion or sore throat.  This sometimes means that you have something impeding drainage in your eustachian tubes.  Usually this is resolved with taking an over-the-counter antihistamine such as Claritin, Allegra, or Zyrtec.  Generics of these worked just as fine and are usually a little less expensive.  You can also use Flonase nasal spray to assist with further nasal congestion.  When you are administering Flonase I recommend that you use the opposite hand of the nostril that you are applying the solution to as this will help keep the Flonase in the nasal passages where  prolongs.  If you are tasting the Flonase you are administering the medication in an incorrect manner. If your symptoms are persisting and not improving over the next 2 to 4 weeks please follow-up with your primary care provider.  If at any point you start to develop swelling around the ear, fevers, drainage from the ear, diminished hearing you can return here or go to the emergency room for further evaluation and management.     ED Prescriptions   None    PDMP not reviewed this encounter.   Providence Crosby, PA-C 10/26/23 1610

## 2023-11-11 ENCOUNTER — Encounter (HOSPITAL_COMMUNITY): Payer: Self-pay | Admitting: *Deleted

## 2023-11-11 ENCOUNTER — Other Ambulatory Visit: Payer: Self-pay

## 2023-11-11 ENCOUNTER — Emergency Department (HOSPITAL_COMMUNITY)
Admission: EM | Admit: 2023-11-11 | Discharge: 2023-11-11 | Disposition: A | Discharge status: Home / Self Care | Attending: Student | Admitting: Student

## 2023-11-11 DIAGNOSIS — J029 Acute pharyngitis, unspecified: Secondary | ICD-10-CM | POA: Diagnosis not present

## 2023-11-11 DIAGNOSIS — R112 Nausea with vomiting, unspecified: Secondary | ICD-10-CM | POA: Diagnosis present

## 2023-11-11 DIAGNOSIS — E119 Type 2 diabetes mellitus without complications: Secondary | ICD-10-CM | POA: Insufficient documentation

## 2023-11-11 DIAGNOSIS — R197 Diarrhea, unspecified: Secondary | ICD-10-CM | POA: Diagnosis not present

## 2023-11-11 DIAGNOSIS — R42 Dizziness and giddiness: Secondary | ICD-10-CM | POA: Insufficient documentation

## 2023-11-11 DIAGNOSIS — R1013 Epigastric pain: Secondary | ICD-10-CM | POA: Diagnosis not present

## 2023-11-11 LAB — CBC WITH DIFFERENTIAL/PLATELET
Abs Immature Granulocytes: 0.05 10*3/uL (ref 0.00–0.07)
Basophils Absolute: 0 10*3/uL (ref 0.0–0.1)
Basophils Relative: 0 %
Eosinophils Absolute: 0 10*3/uL (ref 0.0–0.5)
Eosinophils Relative: 1 %
HCT: 41.6 % (ref 36.0–46.0)
Hemoglobin: 14.5 g/dL (ref 12.0–15.0)
Immature Granulocytes: 1 %
Lymphocytes Relative: 11 %
Lymphs Abs: 0.7 10*3/uL (ref 0.7–4.0)
MCH: 32.7 pg (ref 26.0–34.0)
MCHC: 34.9 g/dL (ref 30.0–36.0)
MCV: 93.9 fL (ref 80.0–100.0)
Monocytes Absolute: 0.5 10*3/uL (ref 0.1–1.0)
Monocytes Relative: 8 %
Neutro Abs: 5.3 10*3/uL (ref 1.7–7.7)
Neutrophils Relative %: 79 %
Platelets: 336 10*3/uL (ref 150–400)
RBC: 4.43 MIL/uL (ref 3.87–5.11)
RDW: 12.2 % (ref 11.5–15.5)
WBC: 6.6 10*3/uL (ref 4.0–10.5)
nRBC: 0 % (ref 0.0–0.2)

## 2023-11-11 LAB — COMPREHENSIVE METABOLIC PANEL
ALT: 15 U/L (ref 0–44)
AST: 19 U/L (ref 15–41)
Albumin: 4.2 g/dL (ref 3.5–5.0)
Alkaline Phosphatase: 56 U/L (ref 38–126)
Anion gap: 12 (ref 5–15)
BUN: 8 mg/dL (ref 6–20)
CO2: 19 mmol/L — ABNORMAL LOW (ref 22–32)
Calcium: 9.1 mg/dL (ref 8.9–10.3)
Chloride: 105 mmol/L (ref 98–111)
Creatinine, Ser: 0.77 mg/dL (ref 0.44–1.00)
GFR, Estimated: >60 mL/min (ref >60)
Glucose, Bld: 107 mg/dL — ABNORMAL HIGH (ref 70–99)
Potassium: 3.8 mmol/L (ref 3.5–5.1)
Sodium: 136 mmol/L (ref 135–145)
Total Bilirubin: 1.2 mg/dL (ref 0.0–1.2)
Total Protein: 8.1 g/dL (ref 6.5–8.1)

## 2023-11-11 LAB — RESP PANEL BY RT-PCR (RSV, FLU A&B, COVID)  RVPGX2
Influenza A by PCR: NEGATIVE
Influenza B by PCR: NEGATIVE
Resp Syncytial Virus by PCR: NEGATIVE
SARS Coronavirus 2 by RT PCR: NEGATIVE

## 2023-11-11 LAB — LIPASE, BLOOD: Lipase: 26 U/L (ref 11–51)

## 2023-11-11 MED ORDER — ONDANSETRON 4 MG PO TBDP: 4.0000 mg | ORAL_TABLET | Freq: Three times a day (TID) | ORAL | 0 refills | Status: DC | PRN

## 2023-11-11 MED ORDER — LACTATED RINGERS IV BOLUS
1000.0000 mL | Freq: Once | INTRAVENOUS | Status: AC
  Administered 2023-11-11: 1000 mL via INTRAVENOUS

## 2023-11-11 MED ORDER — ONDANSETRON 4 MG PO TBDP
4.0000 mg | ORAL_TABLET | Freq: Once | ORAL | Status: AC
Start: 2023-11-11 — End: 2023-11-11
  Administered 2023-11-11: 4 mg via ORAL
  Filled 2023-11-11: qty 1

## 2023-11-11 MED ORDER — FAMOTIDINE IN NACL 20-0.9 MG/50ML-% IV SOLN
20.0000 mg | Freq: Once | INTRAVENOUS | Status: AC
  Administered 2023-11-11: 20 mg via INTRAVENOUS
  Filled 2023-11-11: qty 50

## 2023-11-11 MED ORDER — FAMOTIDINE 20 MG PO TABS: 20.0000 mg | ORAL_TABLET | Freq: Two times a day (BID) | ORAL | 0 refills | Status: DC

## 2023-11-11 MED ORDER — ONDANSETRON HCL 4 MG/2ML IJ SOLN
4.0000 mg | Freq: Once | INTRAMUSCULAR | Status: AC
  Administered 2023-11-11: 4 mg via INTRAVENOUS
  Filled 2023-11-11: qty 2

## 2023-11-11 MED ORDER — POTASSIUM CHLORIDE CRYS ER 20 MEQ PO TBCR
40.0000 meq | EXTENDED_RELEASE_TABLET | Freq: Once | ORAL | Status: AC
Start: 2023-11-11 — End: 2023-11-11
  Administered 2023-11-11: 40 meq via ORAL
  Filled 2023-11-11: qty 2

## 2023-11-11 MED ORDER — MAGNESIUM OXIDE -MG SUPPLEMENT 400 (240 MG) MG PO TABS
800.0000 mg | ORAL_TABLET | Freq: Once | ORAL | Status: AC
  Administered 2023-11-11: 800 mg via ORAL
  Filled 2023-11-11: qty 2

## 2023-11-11 MED ORDER — FAMOTIDINE 20 MG PO TABS: 20.0000 mg | ORAL_TABLET | Freq: Two times a day (BID) | ORAL | 0 refills | Status: AC

## 2023-11-11 NOTE — ED Notes (Addendum)
 Pt initially complained of pain at IV site, from "being poked" when IV was started, but IV had blood return, flushed, no redness or swelling and pt stated nausea relief after Zofran administered via IV. Pt also requested we keep the IV because she is afraid of needles and did not want to be stuck again. No reason to believe infiltration. Fluids and Pepcid started via IV and were infusing appropriately. Pt then complained of pain and yelled at staff to remove the IV. Pepcid infusion was almost complete at this time, IV LR was the only medication still to be delivered. Pt instructed that oral rehydration might be secondary option if she did not want a new IV. IV removal documented and MD notified.

## 2023-11-11 NOTE — ED Triage Notes (Signed)
 C/o dizziness n/v states her troat is sore from throwing up , states she has dry heaves this am.

## 2023-11-11 NOTE — ED Provider Notes (Signed)
 [place] [facility] AT [facility] Provider Note  CSN: [id number] Arrival date & time: 11/11/23 4098  Chief Complaint(s) Shortness of Breath, Emesis, and Dizziness  HPI Annette Juarez is a 28 y.o. female with PMH GERD, gestational diabetes who presents emergency room for evaluation of abdominal pain nausea vomiting and dizziness.  States that symptoms began this morning and she is unable to tolerate p.o..  Endorses sore throat from vomiting.  Denies headache, fever, chest pain, shortness of breath or other systemic symptoms.   Past Medical History Past Medical History:  Diagnosis Date   Diabetes mellitus without complication (HCC)    Gestational diet control   GERD (gastroesophageal reflux disease)    Gestational diabetes    UTI (urinary tract infection)    Patient Active Problem List   Diagnosis Date Noted   Hypokalemia 12/12/2022   Gestational hypertension 07/22/2018   GBS (group B Streptococcus carrier), +RV culture, currently pregnant 07/05/2018   Supervision of other normal pregnancy, antepartum 04/10/2018   Late prenatal care affecting pregnancy 04/10/2018   History of gestational diabetes in prior pregnancy, currently pregnant 04/10/2018   Rh negative, antepartum 05/18/2016   Susceptible to varicella (non-immune), currently pregnant 05/12/2016   Obesity affecting pregnancy, antepartum 05/11/2016   Home Medication(s) Prior to Admission medications   Medication Sig Start Date End Date Taking? Authorizing Provider  HYDROcodone-acetaminophen (NORCO/VICODIN) 5-325 MG tablet Take 1 tablet by mouth 3 (three) times daily as needed. 04/06/23   [provider]  ketorolac (TORADOL) 10 MG tablet Take 1 tablet (10 mg total) by mouth every 6 (six) hours as needed (pain). 04/20/23   Zenia Resides, MD  Vitamin D, Ergocalciferol, (DRISDOL) 1.25 MG (50000 UNIT) CAPS capsule Take 50,000 Units by mouth once a week. 12/06/22   [provider]                                                                                                                                     Past Surgical History Past Surgical History:  Procedure Laterality Date   NO PAST SURGERIES     TUBAL LIGATION Bilateral 07/23/2018   Procedure: POST PARTUM TUBAL LIGATION;  Surgeon: Hermina Staggers, MD;  Location: [facility];  Service: Gynecology;  Laterality: Bilateral;   Family History Family History  Problem Relation Age of Onset   Hypertension Mother    Diabetes Father    Asthma Sister    Diabetes Maternal Grandmother    Hypertension Maternal Grandmother    Hypertension Maternal Grandfather     Social History Social History   Tobacco Use   Smoking status: Never   Smokeless tobacco: Never  Vaping Use   Vaping status: Never Used  Substance Use Topics   Alcohol use: No   Drug use: No   Allergies Patient has no known allergies.  Review of Systems Review of Systems  Gastrointestinal:  Positive for abdominal pain,  nausea and vomiting.    Physical Exam Vital Signs  I have reviewed the triage vital signs BP (!) 144/87 (BP Location: Right Arm)   Pulse (!) 107   Temp (!) 97.5 F (36.4 C) (Oral)   Resp 19   Ht 5\' 4"  (1.626 m)   Wt 99.3 kg   LMP 10/24/2023   SpO2 100%   BMI 37.59 kg/m   Physical Exam Vitals and nursing note reviewed.  Constitutional:      General: She is not in acute distress.    Appearance: She is well-developed.  HENT:     Head: Normocephalic and atraumatic.  Eyes:     Conjunctiva/sclera: Conjunctivae normal.  Cardiovascular:     Rate and Rhythm: Normal rate and regular rhythm.     Heart sounds: No murmur heard. Pulmonary:     Effort: Pulmonary effort is normal. No respiratory distress.     Breath sounds: Normal breath sounds.  Abdominal:     Palpations: Abdomen is soft.     Tenderness: There is abdominal tenderness.  Musculoskeletal:        General: No swelling.     Cervical back: Neck supple.   Skin:    General: Skin is warm and dry.     Capillary Refill: Capillary refill takes less than 2 seconds.  Neurological:     Mental Status: She is alert.  Psychiatric:        Mood and Affect: Mood normal.     ED Results and Treatments Labs (all labs ordered are listed, but only abnormal results are displayed) Labs Reviewed  RESP PANEL BY RT-PCR (RSV, FLU A&B, COVID)  RVPGX2  COMPREHENSIVE METABOLIC PANEL  CBC WITH DIFFERENTIAL/PLATELET  URINALYSIS, ROUTINE W REFLEX MICROSCOPIC  LIPASE, BLOOD                                                                                                                          Radiology No results found.  Pertinent labs & imaging results that were available during my care of the patient were reviewed by me and considered in my medical decision making (see MDM for details).  Medications Ordered in ED Medications  lactated ringers bolus 1,000 mL (has no administration in time range)  ondansetron (ZOFRAN) injection 4 mg (has no administration in time range)  Procedures Procedures  (including critical care time)  Medical Decision Making / ED Course   This patient presents to the ED for concern of ***, this involves an extensive number of treatment options, and is a complaint that carries with it a high risk of complications and morbidity.  The differential diagnosis includes ***  MDM: ***   Additional history obtained: -Additional history obtained from *** -External records from outside source obtained and reviewed including: Chart review including previous notes, labs, imaging, consultation notes   Lab Tests: -I ordered, reviewed, and interpreted labs.   The pertinent results include:   Labs Reviewed  RESP PANEL BY RT-PCR (RSV, FLU A&B, COVID)  RVPGX2  COMPREHENSIVE METABOLIC PANEL  CBC WITH  DIFFERENTIAL/PLATELET  URINALYSIS, ROUTINE W REFLEX MICROSCOPIC  LIPASE, BLOOD      EKG ***  EKG Interpretation Date/Time:    Ventricular Rate:    PR Interval:    QRS Duration:    QT Interval:    QTC Calculation:   R Axis:      Text Interpretation:           Imaging Studies ordered: I ordered imaging studies including *** I independently visualized and interpreted imaging. I agree with the radiologist interpretation   Medicines ordered and prescription drug management: Meds ordered this encounter  Medications   lactated ringers bolus 1,000 mL   ondansetron (ZOFRAN) injection 4 mg    -I have reviewed the patients home medicines and have made adjustments as needed  Critical interventions ***  Consultations Obtained: I requested consultation with the ***,  and discussed lab and imaging findings as well as pertinent plan - they recommend: ***   Cardiac Monitoring: The patient was maintained on a cardiac monitor.  I personally viewed and interpreted the cardiac monitored which showed an underlying rhythm of: ***  Social Determinants of Health:  Factors impacting patients care include: ***   Reevaluation: After the interventions noted above, I reevaluated the patient and found that they have :{resolved/improved/worsened:23923::"improved"}  Co morbidities that complicate the patient evaluation  Past Medical History:  Diagnosis Date   Diabetes mellitus without complication (HCC)    Gestational diet control   GERD (gastroesophageal reflux disease)    Gestational diabetes    UTI (urinary tract infection)       Dispostion: I considered admission for this patient, ***     Final Clinical Impression(s) / ED Diagnoses Final diagnoses:  None     @PCDICTATION @

## 2024-03-04 ENCOUNTER — Encounter (HOSPITAL_COMMUNITY): Payer: Self-pay

## 2024-03-04 ENCOUNTER — Ambulatory Visit (INDEPENDENT_AMBULATORY_CARE_PROVIDER_SITE_OTHER)

## 2024-03-04 ENCOUNTER — Ambulatory Visit (HOSPITAL_COMMUNITY)
Admission: EM | Admit: 2024-03-04 | Discharge: 2024-03-04 | Disposition: A | Discharge status: Home / Self Care | Attending: Family Medicine | Admitting: Family Medicine

## 2024-03-04 DIAGNOSIS — M25512 Pain in left shoulder: Secondary | ICD-10-CM

## 2024-03-04 DIAGNOSIS — M79671 Pain in right foot: Secondary | ICD-10-CM | POA: Diagnosis not present

## 2024-03-04 NOTE — ED Triage Notes (Signed)
 Pt states fell of a porch on Friday. C/o lt shoulder pain and rt foot pain. Took ibuprofen  800mg , flexeril and aleve  with relief.

## 2024-03-04 NOTE — Discharge Instructions (Signed)
 Your x-rays of your right foot and left shoulder were negative for fracture or dislocation. You likely sprained your right foot/left shoulder.   Wear the cam walker boot we provided in the clinic for the next couple of weeks to provide compression, stability, and comfort.  Please rest, ice, and elevate your foot/shoulder to help it heal and decrease inflammation.   Take Aleve  every 12 hours as needed for pain and swelling.  You may take Tylenol  1000 mg every 6 hours as needed for pain.  Call the orthopedic provider listed on your discharge paperwork to schedule a follow-up appointment if your symptoms do not improve in the next 1-2 weeks with supportive care.  Return to urgent care if you experience worsening pain, numbness, tingling, change of color in your skin near the injury, or any other concerning symptoms.  I hope you feel better!

## 2024-03-04 NOTE — ED Provider Notes (Signed)
 [facility]    CSN: [id number] Arrival date & time: 03/04/24  1041      History   Chief Complaint Chief Complaint  Patient presents with   Fall    HPI Annette Juarez is a 28 y.o. female.   Annette Juarez is a 28 y.o. female presenting for chief complaint of Fall.  She was attempting to walk down brick steps off of her porch when she lost her balance.  She grabbed onto a broken rail handle to try to regain her balance and ended up twisting her right foot and falling onto her left shoulder onto the ground.  She did not hit her head during the fall and did not lose consciousness/become nauseous or vomit after falling.  She started experiencing almost immediate pain to the lateral portion of the right foot and ankle as well as the left shoulder.  Denies previous injuries to the right foot/left shoulder, numbness and tingling distally to injuries, neck pain, dizziness, headache, and lacerations/abrasions associated with injuries.  She has had a lot of pain with weightbearing activity of the right foot since injury and has been ambulating with a limp.  She has left shoulder pain at baseline due to braiding hair and tendinitis, states left shoulder pain has worsened since the fall.  She is taking Aleve , ibuprofen , and Flexeril with relief.   Fall    Past Medical History:  Diagnosis Date   Diabetes mellitus without complication (HCC)    Gestational diet control   GERD (gastroesophageal reflux disease)    Gestational diabetes    UTI (urinary tract infection)     Patient Active Problem List   Diagnosis Date Noted   Hypokalemia 12/12/2022   Gestational hypertension 07/22/2018   GBS (group B Streptococcus carrier), +RV culture, currently pregnant 07/05/2018   Supervision of other normal pregnancy, antepartum 04/10/2018   Late prenatal care affecting pregnancy 04/10/2018   History of gestational diabetes in prior pregnancy, currently pregnant 04/10/2018   Rh negative,  antepartum 05/18/2016   Susceptible to varicella (non-immune), currently pregnant 05/12/2016   Obesity affecting pregnancy, antepartum 05/11/2016    Past Surgical History:  Procedure Laterality Date   NO PAST SURGERIES     TUBAL LIGATION Bilateral 07/23/2018   Procedure: POST PARTUM TUBAL LIGATION;  Surgeon: Lorence Ozell CROME, MD;  Location: [facility];  Service: Gynecology;  Laterality: Bilateral;    OB History     Gravida  2   Para  2   Term  2   Preterm  0   AB  0   Living  2      SAB  0   IAB  0   Ectopic  0   Multiple  0   Live Births  2            Home Medications    Prior to Admission medications   Medication Sig Start Date End Date Taking? Authorizing Provider  famotidine  (PEPCID ) 20 MG tablet Take 1 tablet (20 mg total) by mouth 2 (two) times daily. 11/11/23   Kommor, Madison, MD  HYDROcodone -acetaminophen  (NORCO/VICODIN) 5-325 MG tablet Take 1 tablet by mouth 3 (three) times daily as needed. 04/06/23   [provider]  ketorolac  (TORADOL ) 10 MG tablet Take 1 tablet (10 mg total) by mouth every 6 (six) hours as needed (pain). 04/20/23   Vonna Sharlet POUR, MD  ondansetron  (ZOFRAN -ODT) 4 MG disintegrating tablet Take 1 tablet (4 mg total) by mouth every 8 (eight)  hours as needed for nausea or vomiting. 11/11/23   Kommor, Madison, MD  Vitamin D, Ergocalciferol, (DRISDOL) 1.25 MG (50000 UNIT) CAPS capsule Take 50,000 Units by mouth once a week. 12/06/22   [provider]    Family History Family History  Problem Relation Age of Onset   Hypertension Mother    Diabetes Father    Asthma Sister    Diabetes Maternal Grandmother    Hypertension Maternal Grandmother    Hypertension Maternal Grandfather     Social History Social History   Tobacco Use   Smoking status: Never   Smokeless tobacco: Never  Vaping Use   Vaping status: Never Used  Substance Use Topics   Alcohol use: No   Drug use: Yes    Types: Marijuana      Allergies   Patient has no known allergies.   Review of Systems Review of Systems Per HPI  Physical Exam Triage Vital Signs ED Triage Vitals [03/04/24 1119]  Encounter Vitals Group     BP (!) 145/95     Girls Systolic BP Percentile      Girls Diastolic BP Percentile      Boys Systolic BP Percentile      Boys Diastolic BP Percentile      Pulse Rate (!) 107     Resp 18     Temp 98.2 F (36.8 C)     Temp Source Oral     SpO2 98 %     Weight      Height      Head Circumference      Peak Flow      Pain Score 10     Pain Loc      Pain Education      Exclude from Growth Chart    No data found.  Updated Vital Signs BP (!) 145/95 (BP Location: Left Arm)   Pulse (!) 107   Temp 98.2 F (36.8 C) (Oral)   Resp 18   LMP 02/21/2024 (Approximate)   SpO2 98%   Breastfeeding No   Visual Acuity Right Eye Distance:   Left Eye Distance:   Bilateral Distance:    Right Eye Near:   Left Eye Near:    Bilateral Near:     Physical Exam Vitals and nursing note reviewed.  Constitutional:      Appearance: She is not ill-appearing or toxic-appearing.  HENT:     Head: Normocephalic and atraumatic.     Right Ear: Hearing and external ear normal.     Left Ear: Hearing and external ear normal.     Nose: Nose normal.     Mouth/Throat:     Lips: Pink.  Eyes:     General: Lids are normal. Vision grossly intact. Gaze aligned appropriately.     Extraocular Movements: Extraocular movements intact.     Conjunctiva/sclera: Conjunctivae normal.  Pulmonary:     Effort: Pulmonary effort is normal.  Musculoskeletal:     Right shoulder: Normal.     Left shoulder: Tenderness (Diffuse tenderness to palpation over the generalized left shoulder joint) and bony tenderness present. No swelling, deformity, effusion, laceration or crepitus. Normal range of motion (Normal active range of motion despite pain). Normal strength (5/5 strength against resistance with abduction and abduction of the  left upper extremity at the shoulder joint). Normal pulse (+2 left brachial pulse).     Cervical back: Normal and neck supple.     Thoracic back: Normal.     Lumbar back:  Normal.     Right foot: Normal range of motion and normal capillary refill (Less than 2 cap refill). Swelling (Subtle soft tissue swelling over the lateral dorsal right foot), tenderness (Tender to palpation over the base of the fifth metatarsal and over the dorsal lateral portion of the right foot) and bony tenderness present. No deformity, bunion, Charcot foot, foot drop, prominent metatarsal heads, laceration or crepitus. Normal pulse (+2 right dorsalis pedis pulse).     Comments: Sensation and strength intact distally to the right foot.  Skin:    General: Skin is warm and dry.     Capillary Refill: Capillary refill takes less than 2 seconds.     Findings: No rash.  Neurological:     General: No focal deficit present.     Mental Status: She is alert and oriented to person, place, and time. Mental status is at baseline.     Cranial Nerves: No dysarthria or facial asymmetry.  Psychiatric:        Mood and Affect: Mood normal.        Speech: Speech normal.        Behavior: Behavior normal.        Thought Content: Thought content normal.        Judgment: Judgment normal.      UC Treatments / Results  Labs (all labs ordered are listed, but only abnormal results are displayed) Labs Reviewed - No data to display  EKG   Radiology DG Foot Complete Right Result Date: 03/04/2024 CLINICAL DATA:  Clemens off of her porch 3 days ago, pain and swelling to the right foot EXAM: RIGHT FOOT COMPLETE - 3+ VIEW COMPARISON:  April 23, 2010 FINDINGS: No acute fracture or dislocation. There is no evidence of arthropathy or other focal bone abnormality. Soft tissues are unremarkable. No radiopaque foreign body. IMPRESSION: No acute fracture or dislocation. Electronically Signed   By: Rogelia Myers M.D.   On: 03/04/2024 12:10   DG  Shoulder Left Result Date: 03/04/2024 CLINICAL DATA:  reports 3 days ago, pain and swelling to the left shoulder EXAM: LEFT SHOULDER - 2+ VIEW COMPARISON:  None Available. FINDINGS: No acute fracture or dislocation. There is no evidence of arthropathy or other focal bone abnormality. Soft tissues are unremarkable. IMPRESSION: No acute fracture or dislocation. Electronically Signed   By: Rogelia Myers M.D.   On: 03/04/2024 12:09    Procedures Procedures (including critical care time)  Medications Ordered in UC Medications - No data to display  Initial Impression / Assessment and Plan / UC Course  I have reviewed the triage vital signs and the nursing notes.  Pertinent labs & imaging results that were available during my care of the patient were reviewed by me and considered in my medical decision making (see chart for details).   1.  Right foot pain, acute pain of left shoulder, fall Injuries as a result of fall on exam.  X-rays of the left shoulder and the right foot are negative for acute bony abnormalities. We will manage this conservatively with NSAIDs, RICE, CAM Walker boot for support of the right foot/ankle, and follow-up with orthopedics as needed.  Aleve  over-the-counter, Tylenol  over-the-counter, and home supply of Flexeril may be used as needed for pain and inflammation. Neurovascularly intact distally to injuries. Walking referral to Ortho given.  Counseled patient on potential for adverse effects with medications prescribed/recommended today, strict ER and return-to-clinic precautions discussed, patient verbalized understanding.    Final Clinical Impressions(s) / UC  Diagnoses   Final diagnoses:  Right foot pain  Acute pain of left shoulder     Discharge Instructions      Your x-rays of your right foot and left shoulder were negative for fracture or dislocation. You likely sprained your right foot/left shoulder.   Wear the cam walker boot we provided in the clinic for  the next couple of weeks to provide compression, stability, and comfort.  Please rest, ice, and elevate your foot/shoulder to help it heal and decrease inflammation.   Take Aleve  every 12 hours as needed for pain and swelling.  You may take Tylenol  1000 mg every 6 hours as needed for pain.  Call the orthopedic provider listed on your discharge paperwork to schedule a follow-up appointment if your symptoms do not improve in the next 1-2 weeks with supportive care.  Return to urgent care if you experience worsening pain, numbness, tingling, change of color in your skin near the injury, or any other concerning symptoms.  I hope you feel better!     ED Prescriptions   None    PDMP not reviewed this encounter.   Enedelia Dorna HERO, [place] 03/04/24 1311

## 2024-05-20 ENCOUNTER — Other Ambulatory Visit: Payer: Self-pay | Admitting: Surgery

## 2024-05-20 DIAGNOSIS — M25571 Pain in right ankle and joints of right foot: Secondary | ICD-10-CM

## 2024-05-20 DIAGNOSIS — M25512 Pain in left shoulder: Secondary | ICD-10-CM

## 2024-05-26 ENCOUNTER — Emergency Department (HOSPITAL_COMMUNITY): Admission: EM | Admit: 2024-05-26 | Discharge: 2024-05-26 | Disposition: A | Discharge status: Home / Self Care

## 2024-05-26 ENCOUNTER — Encounter (HOSPITAL_COMMUNITY): Payer: Self-pay | Admitting: *Deleted

## 2024-05-26 ENCOUNTER — Other Ambulatory Visit: Payer: Self-pay

## 2024-05-26 DIAGNOSIS — T23262A Burn of second degree of back of left hand, initial encounter: Secondary | ICD-10-CM | POA: Insufficient documentation

## 2024-05-26 DIAGNOSIS — Z23 Encounter for immunization: Secondary | ICD-10-CM | POA: Insufficient documentation

## 2024-05-26 DIAGNOSIS — X102XXA Contact with fats and cooking oils, initial encounter: Secondary | ICD-10-CM | POA: Insufficient documentation

## 2024-05-26 DIAGNOSIS — T310 Burns involving less than 10% of body surface: Secondary | ICD-10-CM | POA: Insufficient documentation

## 2024-05-26 DIAGNOSIS — Y93G3 Activity, cooking and baking: Secondary | ICD-10-CM | POA: Diagnosis not present

## 2024-05-26 DIAGNOSIS — T300 Burn of unspecified body region, unspecified degree: Secondary | ICD-10-CM

## 2024-05-26 LAB — BASIC METABOLIC PANEL WITH GFR
Anion gap: 11 (ref 5–15)
BUN: 6 mg/dL (ref 6–20)
CO2: 21 mmol/L — ABNORMAL LOW (ref 22–32)
Calcium: 8.5 mg/dL — ABNORMAL LOW (ref 8.9–10.3)
Chloride: 105 mmol/L (ref 98–111)
Creatinine, Ser: 0.69 mg/dL (ref 0.44–1.00)
GFR, Estimated: >60 mL/min (ref >60)
Glucose, Bld: 85 mg/dL (ref 70–99)
Potassium: 4.1 mmol/L (ref 3.5–5.1)
Sodium: 137 mmol/L (ref 135–145)

## 2024-05-26 LAB — CBC WITH DIFFERENTIAL/PLATELET
Abs Immature Granulocytes: 0.05 K/uL (ref 0.00–0.07)
Basophils Absolute: 0.1 K/uL (ref 0.0–0.1)
Basophils Relative: 1 %
Eosinophils Absolute: 0.4 K/uL (ref 0.0–0.5)
Eosinophils Relative: 5 %
HCT: 39.2 % (ref 36.0–46.0)
Hemoglobin: 13.1 g/dL (ref 12.0–15.0)
Immature Granulocytes: 1 %
Lymphocytes Relative: 43 %
Lymphs Abs: 4 K/uL (ref 0.7–4.0)
MCH: 32.3 pg (ref 26.0–34.0)
MCHC: 33.4 g/dL (ref 30.0–36.0)
MCV: 96.6 fL (ref 80.0–100.0)
Monocytes Absolute: 0.6 K/uL (ref 0.1–1.0)
Monocytes Relative: 7 %
Neutro Abs: 3.8 K/uL (ref 1.7–7.7)
Neutrophils Relative %: 43 %
Platelets: 378 K/uL (ref 150–400)
RBC: 4.06 MIL/uL (ref 3.87–5.11)
RDW: 11.9 % (ref 11.5–15.5)
WBC: 8.8 K/uL (ref 4.0–10.5)
nRBC: 0 % (ref 0.0–0.2)

## 2024-05-26 MED ORDER — BACITRACIN ZINC 500 UNIT/GM EX OINT: 1.0000 | TOPICAL_OINTMENT | Freq: Two times a day (BID) | CUTANEOUS | 0 refills | Status: AC

## 2024-05-26 MED ORDER — ONDANSETRON HCL 4 MG/2ML IJ SOLN
4.0000 mg | Freq: Once | INTRAMUSCULAR | Status: AC
  Administered 2024-05-26: 4 mg via INTRAVENOUS
  Filled 2024-05-26: qty 2

## 2024-05-26 MED ORDER — TETANUS-DIPHTH-ACELL PERTUSSIS 5-2.5-18.5 LF-MCG/0.5 IM SUSY
0.5000 mL | PREFILLED_SYRINGE | Freq: Once | INTRAMUSCULAR | Status: AC
  Administered 2024-05-26: 0.5 mL via INTRAMUSCULAR
  Filled 2024-05-26: qty 0.5

## 2024-05-26 MED ORDER — LIDOCAINE-PRILOCAINE 2.5-2.5 % EX CREA
TOPICAL_CREAM | Freq: Once | CUTANEOUS | Status: DC
  Filled 2024-05-26: qty 5

## 2024-05-26 MED ORDER — HYDROMORPHONE HCL 1 MG/ML IJ SOLN
1.0000 mg | Freq: Once | INTRAMUSCULAR | Status: AC
  Administered 2024-05-26: 1 mg via INTRAVENOUS
  Filled 2024-05-26: qty 1

## 2024-05-26 NOTE — ED Triage Notes (Signed)
 PT was frying chicken and hot grease splashed on her L hand.  Quarter size 2nd degree burn to L hand.

## 2024-05-26 NOTE — ED Provider Triage Note (Signed)
 Emergency Medicine Provider Triage Evaluation Note  Annette Juarez , a 28 y.o. female  was evaluated in triage.  Pt complains of burn on the dorsal left hand that is secondary to hot oil splashed while cooking.  She complains of severe pain to the same, instinctively reached onto the dorsum of the hand and noticed sloughing of tissue from the hand.  Hand is currently bandaged with a moist dressing applied.  She has severe pain to the same area.  Review of Systems  Positive: As above Negative:   Physical Exam  Ht 5' 4 (1.626 m)   Wt 99.3 kg   LMP 05/24/2024   BMI 37.58 kg/m  Gen:   Awake, no distress obvious discomfort Resp:  Normal effort  MSK:   Moves extremities without difficulty  Other:  Dressed burn to the dorsum of the left hand  Medical Decision Making  Medically screening exam initiated at 8:43 PM.  Appropriate orders placed.  Annette Juarez was informed that the remainder of the evaluation will be completed by another provider, this initial triage assessment does not replace that evaluation, and the importance of remaining in the ED until their evaluation is complete.  Order for Emla gel as well as basic labs and initial pain management ordered.   Annette Juarez, Annette Juarez 05/26/24 2045

## 2024-05-26 NOTE — Discharge Instructions (Addendum)
 Alternate tylenol  and motrin  every 6-8 hours.  Can take up to 800mg  motrin  3x daily, up to 2000 my tylenol  daily. Keep burn clean at least once daily.  Use the topical ointment to help prevent infection and aid healing. Monitor for any purulent drainage, redness, fevers, etc. Follow-up with your primary care doctor. Return here for new concerns.

## 2024-05-26 NOTE — ED Provider Notes (Signed)
  EMERGENCY DEPARTMENT AT [facility] Provider Note   CSN: [id number] Arrival date & time: 05/26/24  2034     Patient presents with: Burn   Annette Juarez is a 28 y.o. female.    Burn  28 year old female presenting to the ED with a burn to left dorsal hand.  States she was finishing up cooking Sunday dinner and moved the grease pot to back burner on the stove and some sliced over onto her left hand.  She instinctively tried to rub the oil off and sloughed off a small piece of her skin.  States she has significant pain to the area.  No other areas of burn identified.  Last tetanus 2019.  Prior to Admission medications   Medication Sig Start Date End Date Taking? Authorizing Provider  bacitracin ointment Apply 1 Application topically 2 (two) times daily. 05/26/24  Yes Jarold Olam HERO, PA-C  famotidine  (PEPCID ) 20 MG tablet Take 1 tablet (20 mg total) by mouth 2 (two) times daily. 11/11/23   Kommor, Madison, MD  HYDROcodone -acetaminophen  (NORCO/VICODIN) 5-325 MG tablet Take 1 tablet by mouth 3 (three) times daily as needed. 04/06/23   [provider]  ketorolac  (TORADOL ) 10 MG tablet Take 1 tablet (10 mg total) by mouth every 6 (six) hours as needed (pain). 04/20/23   Vonna Sharlet POUR, MD  ondansetron  (ZOFRAN -ODT) 4 MG disintegrating tablet Take 1 tablet (4 mg total) by mouth every 8 (eight) hours as needed for nausea or vomiting. 11/11/23   Kommor, Madison, MD  Vitamin D, Ergocalciferol, (DRISDOL) 1.25 MG (50000 UNIT) CAPS capsule Take 50,000 Units by mouth once a week. 12/06/22   [provider]    Allergies: Patient has no known allergies.    Review of Systems  Skin:  Positive for wound (burn).  All other systems reviewed and are negative.   Updated Vital Signs BP (!) 102/54   Pulse 68   Temp 98.6 F (37 C)   Resp 18   Ht 5' 4 (1.626 m)   Wt 99.3 kg   LMP 05/24/2024   SpO2 100%   BMI 37.58 kg/m   Physical Exam Vitals and nursing note  reviewed.  Constitutional:      Appearance: She is well-developed.  HENT:     Head: Normocephalic and atraumatic.  Eyes:     Conjunctiva/sclera: Conjunctivae normal.     Pupils: Pupils are equal, round, and reactive to light.  Cardiovascular:     Rate and Rhythm: Normal rate and regular rhythm.     Heart sounds: Normal heart sounds.  Pulmonary:     Effort: Pulmonary effort is normal.     Breath sounds: Normal breath sounds.  Abdominal:     General: Bowel sounds are normal.     Palpations: Abdomen is soft.  Musculoskeletal:        General: Normal range of motion.       Hands:     Cervical back: Normal range of motion.     Comments: Burn to dorsum of left thumb as depicted, 2nd degree with area of skin sloughed off and some adjacent intact blisters, skin beneath burn appears healthy, no drainage/bleeding, no eschar, no finger contractures, palm of hand is normal in appearance normal ROM and intact sensation  Skin:    General: Skin is warm and dry.  Neurological:     Mental Status: She is alert and oriented to person, place, and time.     (all labs ordered are listed,  but only abnormal results are displayed) Labs Reviewed  BASIC METABOLIC PANEL WITH GFR - Abnormal; Notable for the following components:      Result Value   CO2 21 (*)    Calcium 8.5 (*)    All other components within normal limits  CBC WITH DIFFERENTIAL/PLATELET  URINALYSIS, ROUTINE W REFLEX MICROSCOPIC    EKG: None  Radiology: No results found.   Procedures   Medications Ordered in the ED  HYDROmorphone  (DILAUDID ) injection 1 mg (1 mg Intravenous Given 05/26/24 2104)  ondansetron  (ZOFRAN ) injection 4 mg (4 mg Intravenous Given 05/26/24 2121)  Tdap (BOOSTRIX ) injection 0.5 mL (0.5 mLs Intramuscular Given 05/26/24 2235)                                    Medical Decision Making Risk OTC drugs. Prescription drug management.   28 y.o. F here with burn to left hand.  Has a 2x2 size second degree  burn to dorsal left thumb and slightly into dorsal hand.  Skin beneath appears healthy.  There are some adjacent intact blisters.  No signs of infection at present.  No contractures, no palmar involvement.  TBSA involved <1%.  Tetanus updated here.  Discussed home wound care instructions, dressing changes daily.  Can follow-up with PCP, monitor for any signs of infection.  Return here for new concerns.  Final diagnoses:  Burn    ED Discharge Orders          Ordered    bacitracin ointment  2 times daily        05/26/24 2222               Jarold Olam HERO, PA-C 05/26/24 2331    Ula Prentice SAUNDERS, MD 05/27/24 2352

## 2024-06-04 ENCOUNTER — Other Ambulatory Visit

## 2024-06-23 ENCOUNTER — Inpatient Hospital Stay: Admission: RE | Admit: 2024-06-23 | Source: Ambulatory Visit

## 2024-06-23 ENCOUNTER — Other Ambulatory Visit

## 2024-07-29 ENCOUNTER — Other Ambulatory Visit

## 2024-07-29 ENCOUNTER — Inpatient Hospital Stay: Admission: RE | Admit: 2024-07-29 | Source: Ambulatory Visit

## 2024-08-01 ENCOUNTER — Encounter: Payer: Self-pay | Admitting: Emergency Medicine

## 2024-08-01 ENCOUNTER — Ambulatory Visit: Admission: EM | Admit: 2024-08-01 | Discharge: 2024-08-01 | Disposition: A | Discharge status: Home / Self Care

## 2024-08-01 DIAGNOSIS — K529 Noninfective gastroenteritis and colitis, unspecified: Secondary | ICD-10-CM

## 2024-08-01 MED ORDER — ONDANSETRON 8 MG PO TBDP: 8.0000 mg | ORAL_TABLET | Freq: Three times a day (TID) | ORAL | 0 refills | Status: AC | PRN

## 2024-08-01 MED ORDER — ONDANSETRON 4 MG PO TBDP
4.0000 mg | ORAL_TABLET | Freq: Once | ORAL | Status: AC
  Administered 2024-08-01: 4 mg via ORAL

## 2024-08-01 NOTE — ED Provider Notes (Signed)
 [facility]    CSN: [id number] Arrival date & time: 08/01/24  1607      History   Chief Complaint Chief Complaint  Patient presents with   Emesis    HPI Annette Juarez is a 28 y.o. female.   Pt presents today due to nausea, vomiting, and diarrhea that started yesterday. Pt states that she has had 7 episodes of vomiting and 3 episodes of diarrhea. Pt states that her daughter had similar symptoms 1 week ago, she was diagnosed with a stomach bug.   The history is provided by the patient.  Emesis   Past Medical History:  Diagnosis Date   Diabetes mellitus without complication (HCC)    Gestational diet control   GERD (gastroesophageal reflux disease)    Gestational diabetes    UTI (urinary tract infection)     Patient Active Problem List   Diagnosis Date Noted   Hypokalemia 12/12/2022   Gestational hypertension 07/22/2018   GBS (group B Streptococcus carrier), +RV culture, currently pregnant 07/05/2018   Supervision of other normal pregnancy, antepartum 04/10/2018   Late prenatal care affecting pregnancy 04/10/2018   History of gestational diabetes in prior pregnancy, currently pregnant 04/10/2018   Rh negative, antepartum 05/18/2016   Susceptible to varicella (non-immune), currently pregnant 05/12/2016   Obesity affecting pregnancy, antepartum 05/11/2016    Past Surgical History:  Procedure Laterality Date   NO PAST SURGERIES     TUBAL LIGATION Bilateral 07/23/2018   Procedure: POST PARTUM TUBAL LIGATION;  Surgeon: Lorence Ozell CROME, MD;  Location: [facility];  Service: Gynecology;  Laterality: Bilateral;    OB History     Gravida  2   Para  2   Term  2   Preterm  0   AB  0   Living  2      SAB  0   IAB  0   Ectopic  0   Multiple  0   Live Births  2            Home Medications    Prior to Admission medications   Medication Sig Start Date End Date Taking? Authorizing Provider  cyclobenzaprine (FLEXERIL) 10 MG  tablet Take 10 mg by mouth 3 (three) times daily as needed. 04/24/24  Yes [provider]  naproxen  sodium (ALEVE ) 220 MG tablet Take 220 mg by mouth.   Yes [provider]  ondansetron  (ZOFRAN -ODT) 8 MG disintegrating tablet Take 1 tablet (8 mg total) by mouth every 8 (eight) hours as needed. 08/01/24  Yes Andra Krabbe C, PA-C  bacitracin  ointment Apply 1 Application topically 2 (two) times daily. 05/26/24   Jarold Olam HERO, PA-C  famotidine  (PEPCID ) 20 MG tablet Take 1 tablet (20 mg total) by mouth 2 (two) times daily. 11/11/23   Kommor, Madison, MD  HYDROcodone -acetaminophen  (NORCO/VICODIN) 5-325 MG tablet Take 1 tablet by mouth 3 (three) times daily as needed. 04/06/23   [provider]  ketorolac  (TORADOL ) 10 MG tablet Take 1 tablet (10 mg total) by mouth every 6 (six) hours as needed (pain). 04/20/23   Banister, Pamela K, MD  Vitamin D, Ergocalciferol, (DRISDOL) 1.25 MG (50000 UNIT) CAPS capsule Take 50,000 Units by mouth once a week. 12/06/22   [provider]    Family History Family History  Problem Relation Age of Onset   Hypertension Mother    Diabetes Father    Asthma Sister    Diabetes Maternal Grandmother    Hypertension Maternal Grandmother  Hypertension Maternal Grandfather     Social History Social History   Tobacco Use   Smoking status: Never    Passive exposure: Never   Smokeless tobacco: Never  Vaping Use   Vaping status: Never Used  Substance Use Topics   Alcohol use: No   Drug use: Yes    Types: Marijuana     Allergies   Patient has no known allergies.   Review of Systems Review of Systems  Gastrointestinal:  Positive for vomiting.     Physical Exam Triage Vital Signs ED Triage Vitals  Encounter Vitals Group     BP 08/01/24 1624 136/88     Girls Systolic BP Percentile --      Girls Diastolic BP Percentile --      Boys Systolic BP Percentile --      Boys Diastolic BP Percentile --      Pulse Rate 08/01/24  1624 92     Resp 08/01/24 1624 18     Temp 08/01/24 1624 97.9 F (36.6 C)     Temp Source 08/01/24 1624 Oral     SpO2 08/01/24 1624 98 %     Weight 08/01/24 1624 218 lb 14.7 oz (99.3 kg)     Height --      Head Circumference --      Peak Flow --      Pain Score 08/01/24 1623 10     Pain Loc --      Pain Education --      Exclude from Growth Chart --    No data found.  Updated Vital Signs BP 136/88 (BP Location: Left Arm)   Pulse 92   Temp 97.9 F (36.6 C) (Oral)   Resp 18   Wt 218 lb 14.7 oz (99.3 kg)   LMP 07/21/2024 (Exact Date)   SpO2 98%   BMI 37.58 kg/m   Visual Acuity Right Eye Distance:   Left Eye Distance:   Bilateral Distance:    Right Eye Near:   Left Eye Near:    Bilateral Near:     Physical Exam Vitals and nursing note reviewed.  Constitutional:      General: She is not in acute distress.    Appearance: Normal appearance. She is not ill-appearing, toxic-appearing or diaphoretic.  Eyes:     General: No scleral icterus. Cardiovascular:     Rate and Rhythm: Normal rate and regular rhythm.     Heart sounds: Normal heart sounds.  Pulmonary:     Effort: Pulmonary effort is normal. No respiratory distress.     Breath sounds: Normal breath sounds. No wheezing or rhonchi.  Abdominal:     General: Abdomen is flat. Bowel sounds are normal.     Palpations: Abdomen is soft.     Tenderness: There is no abdominal tenderness. There is no right CVA tenderness or left CVA tenderness.  Skin:    General: Skin is warm.  Neurological:     Mental Status: She is alert and oriented to person, place, and time.  Psychiatric:        Mood and Affect: Mood normal.        Behavior: Behavior normal.      UC Treatments / Results  Labs (all labs ordered are listed, but only abnormal results are displayed) Labs Reviewed - No data to display  EKG   Radiology No results found.  Procedures Procedures (including critical care time)  Medications Ordered in  UC Medications  ondansetron  (ZOFRAN -ODT) disintegrating tablet  4 mg (4 mg Oral Given 08/01/24 1628)    Initial Impression / Assessment and Plan / UC Course  I have reviewed the triage vital signs and the nursing notes.  Pertinent labs & imaging results that were available during my care of the patient were reviewed by me and considered in my medical decision making (see chart for details).     Final Clinical Impressions(s) / UC Diagnoses   Final diagnoses:  Acute gastroenteritis     Discharge Instructions      You have been diagnosed with a gastrointestinal issue today with symptoms of nausea, vomiting, and/or diarrhea.  It is best that you eat a bland diet which includes dry toast, applesauce, bananas, chicken broth, plain rice, or plain mashed potatoes eat these things with no salt-and-pepper, excessive butter, or other seasonings.  It is also important to drink plenty of fluids as you are losing a lot of electrolytes due to vomiting and having diarrhea.  Diluted Gatorade, water, Pedialyte, liquid IV, etc. are good choices for fluid intake.  You may also use popsicles and/or Italian ice.  It is recommended that you do not use anything to stop your diarrhea as it is your body's way of getting rid of the offending bug.  If your symptoms are not improving within 3 to 5 days, you are experiencing significant fatigue, or you are urinating less frequently you will need to follow-up with your PCP or report to the ER.     ED Prescriptions     Medication Sig Dispense Auth. Provider   ondansetron  (ZOFRAN -ODT) 8 MG disintegrating tablet Take 1 tablet (8 mg total) by mouth every 8 (eight) hours as needed. 20 tablet Andra Corean BROCKS, PA-C      PDMP not reviewed this encounter.   Andra Corean BROCKS, PA-C 08/01/24 1718

## 2024-08-01 NOTE — ED Triage Notes (Signed)
 Pt presents c/o emesis and diarrhea x 1 day. Pt states,  I think I got a stomach bug. My daughter had it last Friday with the same sxs and now I'm throwing up like she was. I've thrown up so much there is nothing left to come out.  Pt denies any additional sxs.

## 2024-08-01 NOTE — Discharge Instructions (Signed)

## 2024-08-24 ENCOUNTER — Inpatient Hospital Stay: Admission: RE | Admit: 2024-08-24 | Source: Ambulatory Visit

## 2024-08-24 ENCOUNTER — Other Ambulatory Visit

## 2024-09-20 ENCOUNTER — Other Ambulatory Visit

## 2024-10-08 ENCOUNTER — Other Ambulatory Visit
